# Patient Record
Sex: Female | Born: 1980 | Race: White | Hispanic: No | Smoking: Never smoker
Health system: Southern US, Community
[De-identification: ages and names within clinical notes are randomized; demographics above are authoritative.]

## PROBLEM LIST (undated history)

## (undated) DIAGNOSIS — L409 Psoriasis, unspecified: Secondary | ICD-10-CM

## (undated) DIAGNOSIS — R519 Headache, unspecified: Secondary | ICD-10-CM

## (undated) DIAGNOSIS — G43909 Migraine, unspecified, not intractable, without status migrainosus: Secondary | ICD-10-CM

## (undated) DIAGNOSIS — T7840XA Allergy, unspecified, initial encounter: Secondary | ICD-10-CM

## (undated) DIAGNOSIS — N921 Excessive and frequent menstruation with irregular cycle: Secondary | ICD-10-CM

## (undated) DIAGNOSIS — K829 Disease of gallbladder, unspecified: Secondary | ICD-10-CM

## (undated) DIAGNOSIS — E739 Lactose intolerance, unspecified: Secondary | ICD-10-CM

## (undated) DIAGNOSIS — B019 Varicella without complication: Secondary | ICD-10-CM

## (undated) DIAGNOSIS — M549 Dorsalgia, unspecified: Secondary | ICD-10-CM

## (undated) DIAGNOSIS — K760 Fatty (change of) liver, not elsewhere classified: Secondary | ICD-10-CM

## (undated) HISTORY — DX: Excessive and frequent menstruation with irregular cycle: N92.1

## (undated) HISTORY — DX: Migraine, unspecified, not intractable, without status migrainosus: G43.909

## (undated) HISTORY — DX: Allergy, unspecified, initial encounter: T78.40XA

## (undated) HISTORY — DX: Dorsalgia, unspecified: M54.9

## (undated) HISTORY — DX: Varicella without complication: B01.9

## (undated) HISTORY — DX: Lactose intolerance, unspecified: E73.9

## (undated) HISTORY — DX: Psoriasis, unspecified: L40.9

## (undated) HISTORY — PX: WISDOM TOOTH EXTRACTION: SHX21

## (undated) HISTORY — DX: Disease of gallbladder, unspecified: K82.9

## (undated) HISTORY — DX: Fatty (change of) liver, not elsewhere classified: K76.0

## (undated) HISTORY — DX: Headache, unspecified: R51.9

---
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
## 2002-01-31 ENCOUNTER — Other Ambulatory Visit: Admission: RE | Admit: 2002-01-31 | Discharge: 2002-01-31 | Payer: Self-pay | Admitting: Family Medicine

## 2002-05-07 ENCOUNTER — Other Ambulatory Visit: Admission: RE | Admit: 2002-05-07 | Discharge: 2002-05-07 | Payer: Self-pay | Admitting: Obstetrics and Gynecology

## 2002-07-15 ENCOUNTER — Encounter: Payer: Self-pay | Admitting: Emergency Medicine

## 2002-07-15 ENCOUNTER — Emergency Department (HOSPITAL_COMMUNITY): Admission: EM | Admit: 2002-07-15 | Discharge: 2002-07-16 | Payer: Self-pay | Admitting: Emergency Medicine

## 2003-06-24 ENCOUNTER — Other Ambulatory Visit: Admission: RE | Admit: 2003-06-24 | Discharge: 2003-06-24 | Payer: Self-pay | Admitting: Obstetrics and Gynecology

## 2004-03-24 ENCOUNTER — Other Ambulatory Visit: Admission: RE | Admit: 2004-03-24 | Discharge: 2004-03-24 | Payer: Self-pay | Admitting: Family Medicine

## 2013-01-30 ENCOUNTER — Emergency Department (HOSPITAL_COMMUNITY): Payer: No Typology Code available for payment source

## 2013-01-30 ENCOUNTER — Encounter (HOSPITAL_COMMUNITY): Payer: Self-pay | Admitting: Emergency Medicine

## 2013-01-30 ENCOUNTER — Emergency Department (HOSPITAL_COMMUNITY)
Admission: EM | Admit: 2013-01-30 | Discharge: 2013-01-30 | Disposition: A | Discharge status: Home / Self Care | Payer: No Typology Code available for payment source | Attending: Emergency Medicine | Admitting: Emergency Medicine

## 2013-01-30 DIAGNOSIS — S060X9A Concussion with loss of consciousness of unspecified duration, initial encounter: Secondary | ICD-10-CM | POA: Insufficient documentation

## 2013-01-30 DIAGNOSIS — Z79899 Other long term (current) drug therapy: Secondary | ICD-10-CM | POA: Insufficient documentation

## 2013-01-30 DIAGNOSIS — Z792 Long term (current) use of antibiotics: Secondary | ICD-10-CM | POA: Insufficient documentation

## 2013-01-30 DIAGNOSIS — R110 Nausea: Secondary | ICD-10-CM | POA: Insufficient documentation

## 2013-01-30 DIAGNOSIS — Y9389 Activity, other specified: Secondary | ICD-10-CM | POA: Insufficient documentation

## 2013-01-30 DIAGNOSIS — R42 Dizziness and giddiness: Secondary | ICD-10-CM | POA: Insufficient documentation

## 2013-01-30 DIAGNOSIS — R4789 Other speech disturbances: Secondary | ICD-10-CM | POA: Insufficient documentation

## 2013-01-30 DIAGNOSIS — Y92410 Unspecified street and highway as the place of occurrence of the external cause: Secondary | ICD-10-CM | POA: Insufficient documentation

## 2013-01-30 DIAGNOSIS — S060XAA Concussion with loss of consciousness status unknown, initial encounter: Secondary | ICD-10-CM | POA: Insufficient documentation

## 2013-01-30 MED ORDER — ACETAMINOPHEN 500 MG PO TABS
1000.0000 mg | ORAL_TABLET | Freq: Once | ORAL | Status: AC
  Administered 2013-01-30: 1000 mg via ORAL
  Filled 2013-01-30: qty 2
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
## 2013-01-30 NOTE — Discharge Instructions (Signed)
Follow up with your Primary care doctor in 2 days for reevaluation of symptoms. Get plenty of rest. Avoid participation in any sporting activities or high risk activities for ttrauma until symptoms completely resolve. If headache should worsen or you develop progressive confusion please return to ED. May take tylenol/motrin for Headache as needed. Refer to resource guide below for establishing primary care provider.    Emergency Department Resource Guide 1) Find a Doctor and Pay Out of Pocket Although you won't have to find out who is covered by your insurance plan, it is a good idea to ask around and get recommendations. You will then need to call the office and see if the doctor you have chosen will accept you as a new patient and what types of options they offer for patients who are self-pay. Some doctors offer discounts or will set up payment plans for their patients who do not have insurance, but you will need to ask so you aren't surprised when you get to your appointment.  2) Contact Your Local Health Department Not all health departments have doctors that can see patients for sick visits, but many do, so it is worth a call to see if yours does. If you don't know where your local health department is, you can check in your phone book. The CDC also has a tool to help you locate your state's health department, and many state websites also have listings of all of their local health departments.  3) Find a Walk-in Clinic If your illness is not likely to be very severe or complicated, you may want to try a walk in clinic. These are popping up all over the country in pharmacies, drugstores, and shopping centers. They're usually staffed by nurse practitioners or physician assistants that have been trained to treat common illnesses and complaints. They're usually fairly quick and inexpensive. However, if you have serious medical issues or chronic medical problems, these are probably not your best  option.  No Primary Care Doctor: - Call Health Connect at  [phone] - they can help you locate a primary care doctor that  accepts your insurance, provides certain services, etc. - Physician Referral Service- [phone]  Chronic Pain Problems: Organization         Address  Phone   Notes  [facility]  [phone] Patients need to be referred by their primary care doctor.   Medication Assistance: Organization         Address  Phone   Notes  [facility] [place] [address] [place] [place], [place] [address] [phone] --Must be a resident of [facility] -- Must have NO insurance coverage whatsoever (no Medicaid/ Medicare, etc.) -- The pt. MUST have a primary care doctor that directs their care regularly and follows them in the community   [facility]ist  [phone]   [employer]  [phone]    Agencies that provide inexpensive medical care: Organization         Address  Phone   Notes  [facility]  [phone]   [facility]    [phone]   [facility] [address] [place], [place] [address] [phone]   [facility] [address] [place]. [address] [place] [phone]   Planned Parenthood    [phone]   [facility]    [phone]   [facility]  [address], [place] Phone:  [phone]) [phone], Fax:  [phone]) [phone] Hours of Operation:  9 am - 6 pm, M-F.  Also accepts Medicaid/Medicare and self-pay.  [facility]  [address], [place] Phone: [phone](336) [phone], Fax: [phone]. Hours of Operation:  8:30 am - 5:30 pm, M-F.  Also accepts Medicaid and self-pay.  [facility] [place] [address], [place] Phone: [phone](336) [phone]   [facility] [address], [place], [place] [phone], Ext. [phone] Mondays & Thursdays: 7-9 AM.  First 15  patients are seen on a first come, first serve basis.    Medicaid-accepting [facility] Providers:  Organization         Address  Phone   Notes  [facility] [address], [place] [phone]) [phone] Also accepts self-pay patients.  [facility] [address], [place]  [phone]) [phone]   [facility] [address], [place] [phone]) [phone]   [facility] [address] [place] [address], [place] [phone]) [phone]   Renaye RakersVeita Bland [address], [place]   [phone] Only accepts [place] Access [place] patients after they have their name applied to their card.   Self-Pay (no insurance) in [place]:  Organization         Address  Phone   Notes  Sickle Cell Patients, [facility] [address], [place] [phone]) [phone]   [facility] [address], [place] [phone]) [phone]   [facility]  [address], [place] [phone]) [phone]   [facility]/Dr. Osei-Bonsu  [address] [place] [address], [place] or [phone], [place] [phone]) [phone] Phone number for both [place] and [place] locations is the same.  [facility] [address], [place] [phone]) [phone]   [facility] [place] [address] [place] [address], [place] or [address] [phone]) [phone]   [facility] [address], [place] [phone](336) [phone], phone; [phone](336) [phone], fax Sees patients 1st and 3rd Saturday of every month.  Must not qualify for public or private insurance (i.e. Medicaid, Medicare, [place] Health Choice, Veterans' Benefits)  Household income should be no more than 200% of the poverty level The clinic cannot treat you if you are pregnant or think you are pregnant  Sexually transmitted diseases are not treated at the clinic.    Dental  Care: Organization         Address  Phone  Notes  [facility] [address], [place] [phone] Accepts children up to age 31 who are enrolled in [place] or [place] Health Choice; pregnant women with a Medicaid card; and children who have applied for Medicaid or [place] Health Choice, but were declined, whose parents can pay a reduced fee at time of service.  [facility] Department of [facility] [place]  [address], [place] [phone] Accepts children up to age 31 who are enrolled in [place] or [place] Health Choice; pregnant women with a Medicaid card; and children who have applied for Medicaid or [place] Health Choice, but were declined, whose parents can pay a reduced fee at time of service.  [facility]  [address], [place] [phone]) [phone] Patients are seen  by appointment only. Walk-ins are not accepted. [facility] will see patients 31 years of age and older. Monday - Tuesday (8am-5pm) Most Wednesdays (8:30-5pm) $30 per visit, cash only  [facility] Adult [facility] Access PRO[facility]  [address], [facility] [phone] Patients are seen by appointment only. Walk-ins are not accepted. [facility] will see patients 28 years of age and older. One Wednesday Evening (Monthly: Volunteer Based).  $30 per visit, cash only  [employer]  [phone] for adults; Children under age 64, call Graduate Pediatric Dentistry at [phone]. Children aged 91-14, please call [phone] to request a pediatric application.  Dental services are provided in all areas of dental care including fillings, crowns and bridges, complete and partial dentures, implants, gum treatment, root canals, and extractions. Preventive care is also provided. Treatment is provided to both adults and children. Patients are selected via a lottery and there is often a waiting list.   [facility] [address], [place]  [phone] [email or web address]   [facility] [address] [place], [place] [phone], Ext. [phone] Second and Fourth Thursday of each month, opens at 6:30 AM; Clinic ends at 9 AM.  Patients are seen on a first-come first-served basis, and a limited number are seen during each clinic.   [facility]  [address] [place], [place] [phone]   Eligibility Requirements You must have lived in [place], [place], or [place] counties for at least the last three months.   You cannot be eligible for state or federal sponsored [place], including [employer], [place], or [employer].   You generally cannot be eligible for healthcare insurance through your employer.    How to apply: Eligibility screenings are held every Tuesday and Wednesday afternoon from 1:00 pm until 4:00 pm. You do not need an appointment for the interview!  [facility] [address], [place], [place] [phone]   [facility] Health Department  [phone]   [facility]  [phone]   [facility] Health Department  [phone]    Behavioral Health Resources in the Community: Intensive Outpatient Programs Organization         Address  Phone  Notes  [facility] [address] [place], [place] [phone]   [facility] Outpatient [address] [place], [place] [phone]   [facility] [address], [place], [place]  [phone]   [facility] [address]  [place], [place] [phone] or [phone]   Substance Abuse Resources Organization         Address  Phone  Notes  Al[facility] Drug Services  [phone]   [facility]  [phone]   [facility] [place]  [phone]   Floydene Flock  [phone]   Residential & Outpatient Substance Abuse Program  [phone]    Psychological Services Organization         Address  Phone  Notes  [facility]  [phone]   [facility]  [phone]   [facility] Health [address], [place] [phone] or [phone]    Mobile Crisis Teams Organization         Address  Phone  Notes  [facility], Mobile Crisis Care Unit  [phone]   [facility]  [address] [place], [place] [phone]   [facility] [address] [place]  [phone]    Self-Help/Support Groups Organization         Address  Phone             Notes  [facility] of [place] - variety of support groups  336- [phone] Call for more information  Narcotics Anonymous (NA), [facility] [address], [place]  2 meetings at this location   Statisticianesidential Treatment Programs Organization         Address  Phone  Notes  [facility] [address],    [place]  [phone]   [facility]  [address], [place] [phone], [place], [place] [phone]   [facility] [address], [place] [phone] Admissions: 8am-3pm M-F  [facility] [address]    [place], [place] [phone]   [facility] [address], [place], [place] [phone]   [facility] [address]  [place], [place] [phone]   [facility] - Intensive Outpatient [address], [place], [place] [phone]   [facility] ([facility] [address]  [place], [place] [phone] or [phone]   [facility] ([facility]) [address] [place], [place] [phone] Accepts Medicaid  [facility] [address]  [place] [phone] Substance Abuse/Addiction Treatment   [facility] Behavioral Health Resources Organization         Address  Phone  Notes  [facility]  [phone]) [phone]   Angie FavaJulie Brannon, PhD [address], [address] [place], [place]   [phone](336) [phone] or [phone](336) [phone]   [facility]   [address] [place], [place] [phone](336) [phone]   [facility] [address], [place], [place] [phone]) [phone] Insurance/Medicaid/sponsorship through [facility]  [facility] Families [address]                                    [place], [place] [phone](336) [phone] Therapy/tele-psych/case  [facility] [address].   [place], [place] [phone](336) [phone]    Dr. Lolly MustacheArfeen  [phone](336) [phone]   [facility] [place]  [employer] [facility]. 1) [address],  2) [address], [place] 3)  [address], [place] [phone](336) [phone](336) [phone]  [phone](336) [phone]   [facility] Child Abuse Hotline [phone]) [phone] or [phone](336) [phone] (After Hours)
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
## 2014-01-11 HISTORY — PX: LASIK: SHX215

## 2016-03-01 DIAGNOSIS — H6983 Other specified disorders of Eustachian tube, bilateral: Secondary | ICD-10-CM | POA: Insufficient documentation

## 2016-03-01 DIAGNOSIS — J3089 Other allergic rhinitis: Secondary | ICD-10-CM | POA: Insufficient documentation

## 2016-03-25 ENCOUNTER — Ambulatory Visit: Payer: Self-pay | Admitting: Women's Health

## 2017-08-29 ENCOUNTER — Ambulatory Visit: Payer: Self-pay | Admitting: Internal Medicine

## 2017-09-15 ENCOUNTER — Encounter: Payer: Self-pay | Admitting: Internal Medicine

## 2017-09-15 ENCOUNTER — Ambulatory Visit: Payer: BLUE CROSS/BLUE SHIELD | Admitting: Internal Medicine

## 2017-09-15 VITALS — BP 118/80 | HR 70 | Resp 12 | Ht 65.5 in | Wt 168.0 lb

## 2017-09-15 DIAGNOSIS — G43109 Migraine with aura, not intractable, without status migrainosus: Secondary | ICD-10-CM

## 2017-09-15 DIAGNOSIS — Z8342 Family history of familial hypercholesterolemia: Secondary | ICD-10-CM | POA: Diagnosis not present

## 2017-09-15 DIAGNOSIS — J302 Other seasonal allergic rhinitis: Secondary | ICD-10-CM | POA: Diagnosis not present

## 2017-09-15 DIAGNOSIS — E162 Hypoglycemia, unspecified: Secondary | ICD-10-CM | POA: Diagnosis not present

## 2017-09-15 DIAGNOSIS — R42 Dizziness and giddiness: Secondary | ICD-10-CM

## 2017-09-15 MED ORDER — DESLORATADINE 5 MG PO TABS: 5.0000 mg | ORAL_TABLET | Freq: Every day | ORAL | 11 refills | Status: DC

## 2017-09-15 MED ORDER — SUMATRIPTAN SUCCINATE 50 MG PO TABS: ORAL_TABLET | ORAL | 6 refills | Status: DC

## 2017-09-15 NOTE — Progress Notes (Signed)
Subjective:    Patient ID: Sherri Cobb, female    DOB: July 25, 1980, 37 y.o.   MRN: [id number]  HPI   Here to establish  1.  Seasonal Allergies:  Taking Bee pollen for this.  Spring and Fall.  Has taken Clarinex with improvement and Zyrtec with some improvement in past.  Claritin and Allegra do not help.   Itchy, runny nose and sneezing.  Sometimes itchy, watery eyes.  May have posterior pharyngeal drainage with a sore throat at times as well.     2.  Headaches:  Calls these migraines, but never diagnosed formally.  Started in 20s.  Seem to be getting more severe in intensity with time. Can have a sense of light headedness before the headache occurs.  No definite other aura like symptoms. Always on one side or the other.  Generally frontotemporal area.  Intense consistent pain and sometimes throbbing.  Describes photophobia, phonophobia.  Head massage helps with the pain.   No associated nausea or vomiting. Can last 2 hours.  Takes Motrin 2-4 tabs.  Seems to help, but not relieve completely.   Sleep seems to help, though again, not completely relieve. MJ definitely helps. No associated focal numbness, tingling or weakness. 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 No family history of migraines.    3.  Recurrent "uterine infection" :  OB/GYN very aware of this.  Started 4-5 years ago with IUD.   Had to have IUD removed. Usually occurs 2-3 times yearly.  Currently following with Eveline Keto.  Usually treated with 2 antibiotics.  4.  Vertigo:  Problems with this recently, and resolving.  Started after a visit to [place].    Current Meds  Medication Sig  . norethindrone-ethinyl estradiol (JUNEL FE,GILDESS FE,LOESTRIN FE) 1-20 MG-MCG tablet 1 tablet daily.    No Known Allergies   History reviewed. No pertinent past medical history.   History reviewed. No pertinent surgical history.   Family History  Problem  Relation Age of Onset  . Hyperlipidemia Mother   . Depression Father   . Gout Father   . Kidney Stones Father     Social History   Socioeconomic History  . Marital status: Media planner    Spouse name: Luisa Hart  . Number of children: 2  . Years of education: Not on file  . Highest education level: Associate degree: occupational, Scientist, product/process development, or vocational program  Occupational History  . Occupation: Print production planner office  Social Needs  . Financial resource strain: Not on file  . Food insecurity:    Worry: Never true    Inability: Never true  . Transportation needs:    Medical: No    Non-medical: No  Tobacco Use  . Smoking status: Never Smoker  . Smokeless tobacco: Never Used  Substance and Sexual Activity  . Alcohol use: Yes    Comment: Rare  . Drug use: Yes    Types: Marijuana  . Sexual activity: Yes    Birth control/protection: Pill  Lifestyle  . Physical activity:    Days per week: Not on file    Minutes per session: Not on file  . Stress: To some extent  Relationships  . Social connections:    Talks on phone: Not on file    Gets together: Not on file    Attends religious service: Not on file    Active member of club or organization: Not on file  Attends meetings of clubs or organizations: Not on file    Relationship status: Not on file  . Intimate partner violence:    Fear of current or ex partner: Not on file    Emotionally abused: No    Physically abused: No    Forced sexual activity: Not on file  Other Topics Concern  . Not on file  Social History Narrative   Lives in neighborhood with her 2 daughters   Long term female partner, Luisa Hart, is with them about 50% of time       Review of Systems     Objective:   Physical Exam  NAD\ HEENT:  PERRL, EOMI, Discs sharp. TMs dull with fluid behind, no erythema.  Nasal mucosa with mild swelling and clear discharge.  Posterior pharynx with mild erythema, unable to see cobbling. Neck:   Supple, No adenopathy, no thyromegaly Chest:  CTA CV:  RRR with normal S1 and S2, No S3, S4 or murmur.  Radial and DP pulses normal and equal Neuro:  A & O x 3, CN  II-XII grossly intact, DTRs 2+/4 throughout, Motor 5/5 throughout. Sensory to light touch grossly normal. Rapid alternating motions, finger to nose to finger normal.  No nystagmus with Lucious Groves maneuver.  Gait normal.       Assessment & Plan:  1.  Allergies:  Clarinex 5 mg daily.  Addition of nasal corticosteroids if not adequately relieved.  2.  Migraine headaches:  Sumatriptan 50 mg.  May repeat in 2 hours if not relieved.  Call if no improvement.  3.  Vertigo:  Possibly related to allergies.  No concerning findings on exam.  Reportedly improving.  4.  History of hypoglycemia:  CMP.  5.  Family history of hypercholesterolemia:  FLP  6.  Uterine infections per patient:  Will check her record. This is followed by OB/GYN  7.  HM:  Follow up for CPE in 4 months without gyn exam.

## 2017-09-15 NOTE — Progress Notes (Signed)
LCSW completed new patient screening with patient in order to assess for mental health concerns and/or problems with social determinants of health (food, housing, transportation, interpersonal violence). Patient reported that she has no major issues with SDOH except that she does not feel that her neighborhood is particularly safe. She shared that she feels she has a moderate stress level but nothing concerning. She reported that she feels fatigued easily and has been a "low energy person" all of her life. Scored a 3 on the PHQ-9.  She shared that she is considering having her adolescent daughter start counseling due to self-esteem issues; LCSW encouraged her to call and set up an appointment for daughter if needed.  No other follow-up needed at this time.

## 2017-09-16 LAB — COMPREHENSIVE METABOLIC PANEL
ALBUMIN: 4.6 g/dL (ref 3.5–5.5)
ALK PHOS: 42 IU/L (ref 39–117)
ALT: 16 IU/L (ref 0–32)
AST: 18 IU/L (ref 0–40)
Albumin/Globulin Ratio: 1.8 (ref 1.2–2.2)
BUN / CREAT RATIO: 12 (ref 9–23)
BUN: 11 mg/dL (ref 6–20)
Bilirubin Total: 0.6 mg/dL (ref 0.0–1.2)
CALCIUM: 9.9 mg/dL (ref 8.7–10.2)
CO2: 22 mmol/L (ref 20–29)
CREATININE: 0.93 mg/dL (ref 0.57–1.00)
Chloride: 103 mmol/L (ref 96–106)
GFR calc Af Amer: 93 mL/min/{1.73_m2} (ref >59)
GFR, EST NON AFRICAN AMERICAN: 80 mL/min/{1.73_m2} (ref >59)
GLUCOSE: 83 mg/dL (ref 65–99)
Globulin, Total: 2.6 g/dL (ref 1.5–4.5)
Potassium: 5 mmol/L (ref 3.5–5.2)
Sodium: 142 mmol/L (ref 134–144)
Total Protein: 7.2 g/dL (ref 6.0–8.5)

## 2017-09-16 LAB — LIPID PANEL W/O CHOL/HDL RATIO
CHOLESTEROL TOTAL: 224 mg/dL — AB (ref 100–199)
HDL: 66 mg/dL (ref >39)
LDL CALC: 140 mg/dL — AB (ref 0–99)
Triglycerides: 89 mg/dL (ref 0–149)
VLDL CHOLESTEROL CAL: 18 mg/dL (ref 5–40)

## 2017-12-10 ENCOUNTER — Encounter: Payer: Self-pay | Admitting: Internal Medicine

## 2017-12-10 DIAGNOSIS — Z8742 Personal history of other diseases of the female genital tract: Secondary | ICD-10-CM | POA: Insufficient documentation

## 2017-12-10 HISTORY — DX: Personal history of other diseases of the female genital tract: Z87.42

## 2018-01-16 ENCOUNTER — Encounter: Payer: Self-pay | Admitting: Internal Medicine

## 2018-01-16 ENCOUNTER — Ambulatory Visit: Payer: BLUE CROSS/BLUE SHIELD | Admitting: Internal Medicine

## 2018-01-16 VITALS — BP 118/80 | HR 84 | Resp 12 | Ht 65.5 in | Wt 174.0 lb

## 2018-01-16 DIAGNOSIS — Z0000 Encounter for general adult medical examination without abnormal findings: Secondary | ICD-10-CM | POA: Diagnosis not present

## 2018-01-16 DIAGNOSIS — H547 Unspecified visual loss: Secondary | ICD-10-CM

## 2018-01-16 DIAGNOSIS — G43109 Migraine with aura, not intractable, without status migrainosus: Secondary | ICD-10-CM | POA: Diagnosis not present

## 2018-01-16 DIAGNOSIS — N921 Excessive and frequent menstruation with irregular cycle: Secondary | ICD-10-CM | POA: Insufficient documentation

## 2018-01-16 MED ORDER — SUMATRIPTAN SUCCINATE 50 MG PO TABS: ORAL_TABLET | ORAL | 6 refills | Status: DC

## 2018-01-16 NOTE — Patient Instructions (Signed)
Consider Micronor (no estrogen) regarding BCPs with perhaps decreased headaches associated.  Citrated Calcium 500-600 mg twice daily and Vitamin D a total of 400 IU Daily

## 2018-01-16 NOTE — Progress Notes (Signed)
Subjective:    Patient ID: Sherri Cobb, female    DOB: Sep 21, 1980, 38 y.o.   MRN: [id number]  HPI   CPE with pap  1.  Pap:  Last was in early spring of 2019.  Goes to [employer].  Was having uterine infections.  Plans for follow up there in March 2020.  No family history of cervical cancer.   2.  Mammogram: Never.  No family history of breast cancer.    3.  Osteoprevention:  Very little dairy.  Outdoors on almost daily basis.  Physically active outside.  4-5 hours of strength training every week.    4.  Guaiac Cards:  Never  5.  Colonoscopy:  Never.  No family history of colon cancer.  6.  Immunizations:  Needs Td or Tdap.  Immunization History  Administered Date(s) Administered  . Influenza-Unspecified 12/10/2017    7.  Glucose/Cholesterol:  Glucose in September fine.  Mild hypercholesterolemia with good HDL.. Lipid Panel     Component Value Date/Time   CHOL 224 (H) 09/15/2017 1058   TRIG 89 09/15/2017 1058   HDL 66 09/15/2017 1058   LDLCALC 140 (H) 09/15/2017 1058   Also:  Migraines seem more frequent than before she took OCPs.  Problems with IUDs in past with yeast infection.  No Depo provera in past. Discussed considering Micronor with Gyn in spring. Imitrex 50 does not generally relieve migraines.  Has not tried 100 mg dose all at one time.   MJ works better for headache, but smoking it.  Current Meds  Medication Sig  . norethindrone-ethinyl estradiol (JUNEL FE,GILDESS FE,LOESTRIN FE) 1-20 MG-MCG tablet 1 tablet daily.   . SUMAtriptan (IMITREX) 50 MG tablet 1 tab by mouth for headache May repeat in 2 hours if headache persists or recurs.    No Known Allergies   Past Medical History:  Diagnosis Date  . Menometrorrhagia    Before BCPs   Past Surgical History:  Procedure Laterality Date  . LASIK Bilateral 2016  . WISDOM TOOTH EXTRACTION      Family History  Problem Relation Age of Onset  . Hyperlipidemia Mother   . Depression Father   .  Gout Father   . Kidney Stones Father   . Cancer Maternal Grandmother        skin cancer   Social History   Socioeconomic History  . Marital status: Media planner    Spouse name: Luisa Hart  . Number of children: 2  . Years of education: Not on file  . Highest education level: Associate degree: occupational, Scientist, product/process development, or vocational program  Occupational History  . Occupation: Print production planner office  Social Needs  . Financial resource strain: Not on file  . Food insecurity:    Worry: Never true    Inability: Never true  . Transportation needs:    Medical: No    Non-medical: No  Tobacco Use  . Smoking status: Never Smoker  . Smokeless tobacco: Never Used  Substance and Sexual Activity  . Alcohol use: Yes    Comment: Rare  . Drug use: Yes    Types: Marijuana  . Sexual activity: Yes    Birth control/protection: Pill  Lifestyle  . Physical activity:    Days per week: Not on file    Minutes per session: Not on file  . Stress: To some extent  Relationships  . Social connections:    Talks on phone: Not on file    Gets together: Not on file  Attends religious service: Not on file    Active member of club or organization: Not on file    Attends meetings of clubs or organizations: Not on file    Relationship status: Not on file  . Intimate partner violence:    Fear of current or ex partner: Not on file    Emotionally abused: No    Physically abused: No    Forced sexual activity: Not on file  Other Topics Concern  . Not on file  Social History Narrative   Lives in neighborhood with her 2 daughters   Long term female partner, Luisa Hartatrick, is with them about 50% of time     Review of Systems  Constitutional: Negative for appetite change and fatigue.  HENT: Positive for rhinorrhea (Always has a bit from allergies). Negative for dental problem, ear pain, hearing loss and sore throat.   Eyes: Negative for visual disturbance (History of Lasik in 2016.).    Respiratory: Negative for cough and shortness of breath.   Cardiovascular: Negative for chest pain, palpitations and leg swelling.  Gastrointestinal: Negative for abdominal pain, blood in stool (No melena), constipation and diarrhea.  Genitourinary: Negative for dysuria, frequency, menstrual problem and vaginal discharge.       History of recurrent yeast infections with IUD in past  Musculoskeletal: Negative for arthralgias.  Skin: Negative for rash.  Neurological: Negative for dizziness, weakness and numbness.  Psychiatric/Behavioral: Negative for dysphoric mood. The patient is not nervous/anxious.        Objective:   Physical Exam Constitutional:      Appearance: Normal appearance.  HENT:     Head: Normocephalic and atraumatic.     Jaw: There is normal jaw occlusion.     Right Ear: Hearing, tympanic membrane, ear canal and external ear normal.     Left Ear: Hearing, tympanic membrane, ear canal and external ear normal.     Nose: Nose normal.     Mouth/Throat:     Lips: Pink.     Mouth: Mucous membranes are moist.     Pharynx: Oropharynx is clear. Uvula midline.  Eyes:     Extraocular Movements: Extraocular movements intact.     Conjunctiva/sclera: Conjunctivae normal.     Pupils: Pupils are equal, round, and reactive to light.     Funduscopic exam:    Right eye: Red reflex present.        Left eye: Red reflex present.    Comments: Discs sharp bilaterally  Neck:     Musculoskeletal: Full passive range of motion without pain, normal range of motion and neck supple.     Thyroid: No thyromegaly.  Cardiovascular:     Rate and Rhythm: Normal rate and regular rhythm.     Heart sounds: S1 normal and S2 normal. No murmur. No friction rub. No S3 or S4 sounds.      Comments: Carotid, radial, femoral, DP, and PT pulses normal and equal. Pulmonary:     Effort: Pulmonary effort is normal.     Breath sounds: Normal breath sounds.  Chest:     Comments: Breast exam deferred as she  will be seen by Gyn in the spring. Abdominal:     General: Bowel sounds are normal.     Palpations: Abdomen is soft. There is no hepatomegaly, splenomegaly or mass.     Tenderness: There is no abdominal tenderness.     Hernia: No hernia is present.  Genitourinary:    Comments: Exam deferred to Gyn in spring. Musculoskeletal:  Normal range of motion.  Lymphadenopathy:     Head:     Right side of head: No submental or submandibular adenopathy.     Left side of head: No submental or submandibular adenopathy.     Cervical: No cervical adenopathy.     Upper Body:     Right upper body: No supraclavicular adenopathy.     Left upper body: No supraclavicular adenopathy.     Lower Body: No right inguinal adenopathy. No left inguinal adenopathy.  Skin:    General: Skin is warm.     Capillary Refill: Capillary refill takes less than 2 seconds.     Comments: Multiple tattoos on back and elswhere. Mild scattering of acneiform lesions of face.  Neurological:     Mental Status: She is alert and oriented to person, place, and time.     Cranial Nerves: Cranial nerves are intact.     Sensory: Sensation is intact.     Motor: Motor function is intact.     Coordination: Coordination is intact.     Gait: Gait is intact.     Deep Tendon Reflexes: Reflexes are normal and symmetric.  Psychiatric:        Attention and Perception: Attention and perception normal.        Mood and Affect: Mood normal.        Speech: Speech normal.        Behavior: Behavior normal. Behavior is cooperative.        Thought Content: Thought content normal.        Cognition and Memory: Cognition normal.        Judgment: Judgment normal.        Assessment & Plan:  1.  CPE without pap To have gyn exam in spring with her Gyn [facility]. Citracal 500-600 mg twice daily with Vitamin D of 400 IU daily. Insurance related paperwork filled out from physical.  2.  Migraines:  To  increase to 100 of Imitrex and see if improved relief.  3.  Decreased visual acuity, S/P Lasik in 2016.  Mainly at night.  Referral to Dr. Dione BoozeGroat.

## 2018-03-14 ENCOUNTER — Telehealth: Payer: Self-pay

## 2018-03-14 NOTE — Telephone Encounter (Signed)
Left message for patient informing her of appointment with Dr. Dione Booze for 03/20/2018 @ 2 pm.

## 2018-05-15 ENCOUNTER — Telehealth: Payer: Self-pay | Admitting: Physician Assistant

## 2018-05-15 ENCOUNTER — Telehealth: Payer: Self-pay

## 2018-05-15 DIAGNOSIS — R519 Headache, unspecified: Secondary | ICD-10-CM

## 2018-05-15 DIAGNOSIS — R197 Diarrhea, unspecified: Secondary | ICD-10-CM

## 2018-05-15 DIAGNOSIS — R05 Cough: Secondary | ICD-10-CM

## 2018-05-15 DIAGNOSIS — R51 Headache: Secondary | ICD-10-CM

## 2018-05-15 DIAGNOSIS — R509 Fever, unspecified: Secondary | ICD-10-CM

## 2018-05-15 DIAGNOSIS — R059 Cough, unspecified: Secondary | ICD-10-CM

## 2018-05-15 NOTE — Progress Notes (Signed)
E-Visit for Corona Virus Screening  Based on your current symptoms, you may very well have the virus, however your symptoms are mild. Currently, not all patients are being tested. If the symptoms are mild and there is not a known exposure, performing the test is not indicated.  You have been enrolled in MyChart Home Monitoring for COVID-19. Daily you will receive a questionnaire within the MyChart website. Our COVID-19 response team will be monitoring your responses daily.  I have provided a work note  Coronavirus disease 2019 (COVID-19) is a respiratory illness that can spread from person to person. The virus that causes COVID-19 is a new virus that was first identified in the country of [place] but is now found in multiple other countries and has spread to the [place].  Symptoms associated with the virus are mild to severe fever, cough, and shortness of breath. There is currently no vaccine to protect against COVID-19, and there is no specific antiviral treatment for the virus.   To be considered HIGH RISK for Coronavirus (COVID-19), you have to meet the following criteria:  . Traveled to [place], [place], [place], [place] or [place]; or in the [place] to [place], [place], [place], or [place]; and have fever, cough, and shortness of breath within the last 2 weeks of travel OR  . Been in close contact with a person diagnosed with COVID-19 within the last 2 weeks and have fever, cough, and shortness of breath  . IF YOU DO NOT MEET THESE CRITERIA, YOU ARE CONSIDERED LOW RISK FOR COVID-19.   It is vitally important that if you feel that you have an infection such as this virus or any other virus that you stay home and away from places where you may spread it to others.  You should self-quarantine for 14 days if you have symptoms that could potentially be coronavirus and avoid contact with people age 25 and older.   You can use medication such as A prescription cough medication called  Tessalon Perles 100 mg. You may take 1-2 capsules every 8 hours as needed for cough  You may also take acetaminophen (Tylenol) as needed for fever.   Reduce your risk of any infection by using the same precautions used for avoiding the common cold or flu:  Marland Kitchen Wash your hands often with soap and warm water for at least 20 seconds.  If soap and water are not readily available, use an alcohol-based hand sanitizer with at least 60% alcohol.  . If coughing or sneezing, cover your mouth and nose by coughing or sneezing into the elbow areas of your shirt or coat, into a tissue or into your sleeve (not your hands). . Avoid shaking hands with others and consider head nods or verbal greetings only. . Avoid touching your eyes, nose, or mouth with unwashed hands.  . Avoid close contact with people who are sick. . Avoid places or events with large numbers of people in one location, like concerts or sporting events. . Carefully consider travel plans you have or are making. . If you are planning any travel outside or inside the [place], visit the CDC's Travelers' Health webpage for the latest health notices. . If you have some symptoms but not all symptoms, continue to monitor at home and seek medical attention if your symptoms worsen. . If you are having a medical emergency, call 911.  HOME CARE . Only take medications as instructed by your medical team. . Drink plenty of fluids  and get plenty of rest. . A steam or ultrasonic humidifier can help if you have congestion.   GET HELP RIGHT AWAY IF: . You develop worsening fever. . You become short of breath . You cough up blood. . Your symptoms become more severe MAKE SURE YOU   Understand these instructions.  Will watch your condition.  Will get help right away if you are not doing well or get worse.  Your e-visit answers were reviewed by a board certified advanced clinical practitioner to complete your personal care plan.  Depending on the condition,  your plan could have included both over the counter or prescription medications.  If there is a problem please reply once you have received a response from your provider. Your safety is important to us.  If you have drug allergies check your prescription carefully.    You can use MyChart to ask questions about today's visit, request a non-urgent call back, or ask for a work or school excuse for 24 hours related to this e-Visit. If it has been greater than 24 hours you will need to follow up with your provider, or enter a new e-Visit to address those concerns. You will get an e-mail in the next two days asking about your experience.  I hope that your e-visit has been valuable and will speed your recovery. Thank you for using e-visits.   I have spent 7 min in completion and review of this note- Illa LevelSahar Osman [facility]

## 2018-05-15 NOTE — Telephone Encounter (Signed)
Patient called stating she has been exposed to someone who had COVID-19. Patient states her boyfriends mother and father both had it. States it had been 14 days since they were tested. Patient hugged boyfriends mother last week.  Patient presenting with muscle pain, diarrhea, sore throat and headache. Patient denies any fever or cough but has a little bit of a runny nose. All symptoms started yesterday 05/14/2018. Spoke with Dr. Amil Amen regarding patient ans symptoms. Per Dr. Amil Amen patient to have entire family self-quarantine including boyfriend for the next 14 days. If symptoms get worse she needs to call back to the office for further evaluation.  Patient informed and wants to know why she can not test in formed she has not met the criteria to be tested at this moment. Patient states she has insurance and she would like to just know because she may come in contact with other people. Patient informed again that she needs to self quarantine herself and rest of family members for the next 14 days. Patient verbalized understanding. Patient was told several times to self quarantine.

## 2018-07-31 ENCOUNTER — Other Ambulatory Visit: Payer: Self-pay | Admitting: Internal Medicine

## 2018-07-31 DIAGNOSIS — Z20822 Contact with and (suspected) exposure to covid-19: Secondary | ICD-10-CM

## 2018-08-04 LAB — NOVEL CORONAVIRUS, NAA: SARS-CoV-2, NAA: NOT DETECTED

## 2018-09-13 ENCOUNTER — Other Ambulatory Visit: Payer: Self-pay | Admitting: Emergency Medicine

## 2018-09-13 DIAGNOSIS — Z20822 Contact with and (suspected) exposure to covid-19: Secondary | ICD-10-CM

## 2018-09-14 LAB — NOVEL CORONAVIRUS, NAA: SARS-CoV-2, NAA: NOT DETECTED

## 2018-12-18 ENCOUNTER — Other Ambulatory Visit: Payer: Self-pay

## 2018-12-18 DIAGNOSIS — Z20822 Contact with and (suspected) exposure to covid-19: Secondary | ICD-10-CM

## 2018-12-20 LAB — NOVEL CORONAVIRUS, NAA: SARS-CoV-2, NAA: DETECTED — AB

## 2018-12-22 ENCOUNTER — Encounter: Payer: Self-pay | Admitting: Internal Medicine

## 2018-12-22 NOTE — Telephone Encounter (Signed)
Spoke with patient. Informed we can not write a letter until she has completed her isolation period and is on the mend. Patient verbalized understanding and states she will call back then

## 2018-12-27 ENCOUNTER — Encounter: Payer: Self-pay | Admitting: Internal Medicine

## 2018-12-28 NOTE — Telephone Encounter (Signed)
Called patient to get more information and states she started with symptoms on 12/15/2018 and was tested on 12/18/2018.  Pt was advise by the nurse that called her with positive results that she will be able to return to work 14 days after onset of symptoms.  Pt . States she is still having some tiredness and a little bit of cough and that symptoms have been improving.  Pt. Denies any fever and states last time she took tylenol or ibuprofen was two days ago for a headache .  Routing to Dr. Amil Amen to write letter

## 2018-12-29 NOTE — Telephone Encounter (Signed)
Patient called to get update on the status of letter  to be able to return to work. Patient informed letter is no ready yet and will be contacted when  is ready for pick up. Verbalized understanding.

## 2019-01-01 ENCOUNTER — Telehealth: Payer: Self-pay | Admitting: Internal Medicine

## 2019-01-01 NOTE — Telephone Encounter (Signed)
Note needed for return to work--done and patient being called now to pick up

## 2019-11-20 ENCOUNTER — Encounter: Payer: Self-pay | Admitting: Internal Medicine

## 2019-12-20 ENCOUNTER — Encounter (HOSPITAL_BASED_OUTPATIENT_CLINIC_OR_DEPARTMENT_OTHER): Payer: Self-pay | Admitting: Orthopaedic Surgery

## 2019-12-20 ENCOUNTER — Other Ambulatory Visit: Payer: Self-pay

## 2019-12-24 ENCOUNTER — Other Ambulatory Visit (HOSPITAL_COMMUNITY)
Admission: RE | Admit: 2019-12-24 | Discharge: 2019-12-24 | Disposition: A | Discharge status: Home / Self Care | Payer: 59 | Source: Ambulatory Visit | Attending: Orthopaedic Surgery | Admitting: Orthopaedic Surgery

## 2019-12-24 DIAGNOSIS — Z20822 Contact with and (suspected) exposure to covid-19: Secondary | ICD-10-CM | POA: Insufficient documentation

## 2019-12-24 DIAGNOSIS — Z01812 Encounter for preprocedural laboratory examination: Secondary | ICD-10-CM | POA: Insufficient documentation

## 2019-12-24 LAB — SARS CORONAVIRUS 2 (TAT 6-24 HRS): SARS Coronavirus 2: NEGATIVE

## 2019-12-25 NOTE — H&P (Signed)
PREOPERATIVE H&P  Chief Complaint: UNILATERAL PRIMARY OSTEOARTHRITIS RIGHT KNEE; LOOSE BODY IN RIGHT KNEE, CHONDROMALACIA PATELLAE RIGHT KNEE, CHRONIC INSTABILITY RIGHT KNEE  HPI: Sherri Cobb is a 39 y.o. female who is scheduled for, Procedure(s): KNEE ARTHROSCOPY WITH LATERAL RELEASE; REMOVAL LOOSE FOREIGN BODY; DEBRIDEMENT/SHAVING CHONDROPLASTY LIGAMENT RECONSTRUCTION KNEE EXTRA-ARTICULAR; ANTERIOR TIBIAL TUBERCLEPLASTY; DECOMPRESSION FASCIOTOMY, LEG ANTERIOR.    This is a healthy 39 year old female who has had a history of multiple patellar dislocations since 1996.  She has tried immobilizers and therapy over time and has not made much progress.  She is an active young mother and works as an Environmental health practitioner.  She had a complete dislocation four weeks ago and has had multiple in the past.  She is worried about the long-term health of her knee and the ability to keep up with her kids.    Her symptoms are rated as moderate to severe, and have been worsening.  This is significantly impairing activities of daily living.    Please see clinic note for further details on this patient's care.    She has elected for surgical management.   Past Medical History:  Diagnosis Date  . Menometrorrhagia    Before BCPs   Past Surgical History:  Procedure Laterality Date  . LASIK Bilateral 2016  . WISDOM TOOTH EXTRACTION     Social History   Socioeconomic History  . Marital status: Media planner    Spouse name: Luisa Hart  . Number of children: 2  . Years of education: Not on file  . Highest education level: Associate degree: occupational, Scientist, product/process development, or vocational program  Occupational History  . 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 Smoking status: Never Smoker  . Smokeless tobacco: Never Used  Vaping Use  . Vaping Use: Never used  Substance and Sexual Activity  . Alcohol use: Yes    Comment: Rare  . Drug use: Yes    Types: Marijuana  . Sexual  activity: Yes  Other Topics Concern  . Not on file  Social History Narrative   Lives in neighborhood with her 2 daughters   Long term female partner, Luisa Hart, is with them about 50% of time   Social Determinants of Corporate investment banker Strain: Not on file  Food Insecurity: Not on file  Transportation Needs: Not on file  Physical Activity: Not on file  Stress: Not on file  Social Connections: Not on file   Family History  Problem Relation Age of Onset  . Hyperlipidemia Mother   . Depression Father   . Gout Father   . Kidney Stones Father   . Cancer Maternal Grandmother        skin cancer   No Known Allergies Prior to Admission medications   Medication Sig Start Date End Date Taking? Authorizing Provider  Multiple Vitamin (MULTIVITAMIN WITH MINERALS) TABS tablet Take 1 tablet by mouth daily.   Yes [provider]  SUMAtriptan (IMITREX) 50 MG tablet 2 tabs by mouth for headache May repeat in 2 hours if headache persists or recurs.  Max 200 mg/24 hours 01/16/18   Julieanne Manson, MD    ROS: All other systems have been reviewed and were otherwise negative with the exception of those mentioned in the HPI and as above.  Physical Exam: General: Alert, no acute distress Cardiovascular: No pedal edema Respiratory: No cyanosis, no use of accessory musculature GI: No organomegaly, abdomen is soft and non-tender Skin: No lesions in the area of chief complaint  Neurologic: Sensation intact distally Psychiatric: Patient is competent for consent with normal mood and affect Lymphatic: No axillary or cervical lymphadenopathy  MUSCULOSKELETAL:  Examination of bilateral knees reveals mild lateral translation and patellar apprehension with this.  She has a positive J sign.  Q angle is about 25.  Full range of motion of the knee otherwise.  Some crepitus during range of motion of bilateral knees  Imaging: X-rays which demonstrate possible loose body in the knee versus soft  tissue.  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  Patella appears well centered on sunrise view.  No other obvious arthritic changes of the knee.    Assessment: UNILATERAL PRIMARY OSTEOARTHRITIS RIGHT KNEE; LOOSE BODY IN RIGHT KNEE, CHONDROMALACIA PATELLAE RIGHT KNEE, CHRONIC INSTABILITY RIGHT KNEE  Plan: Plan for Procedure(s): KNEE ARTHROSCOPY WITH LATERAL RELEASE; REMOVAL LOOSE FOREIGN BODY; DEBRIDEMENT/SHAVING CHONDROPLASTY LIGAMENT RECONSTRUCTION KNEE EXTRA-ARTICULAR; ANTERIOR TIBIAL TUBERCLEPLASTY; DECOMPRESSION FASCIOTOMY, LEG ANTERIOR   The risks benefits and alternatives were discussed with the patient including but not limited to the risks of nonoperative treatment, versus surgical intervention including infection, bleeding, nerve injury,  blood clots, cardiopulmonary complications, morbidity, mortality, among others, and they were willing to proceed.   The patient acknowledged the explanation, agreed to proceed with the plan and consent was signed.   Operative Plan: Right knee arthroscopy with lateral release, chondroplasty, loose body excision, MPFL reconstruction with allograft, tibial tubercle transfer Discharge Medications: Tylenol, Meloxicam BID, Oxycodone, Zofran, Robaxin DVT Prophylaxis: Aspirin Physical Therapy: Outpatient PT Special Discharge needs: Knee immobilizer   Vernetta Honey, PA-C  12/25/2019 8:20 PM

## 2019-12-26 NOTE — Progress Notes (Signed)

## 2019-12-27 ENCOUNTER — Ambulatory Visit (HOSPITAL_COMMUNITY): Payer: 59

## 2019-12-27 ENCOUNTER — Ambulatory Visit (HOSPITAL_BASED_OUTPATIENT_CLINIC_OR_DEPARTMENT_OTHER): Payer: 59 | Admitting: Certified Registered"

## 2019-12-27 ENCOUNTER — Encounter (HOSPITAL_BASED_OUTPATIENT_CLINIC_OR_DEPARTMENT_OTHER)
Admission: RE | Disposition: A | Discharge status: Home / Self Care | Payer: Self-pay | Source: Home / Self Care | Attending: Orthopaedic Surgery

## 2019-12-27 ENCOUNTER — Other Ambulatory Visit: Payer: Self-pay

## 2019-12-27 ENCOUNTER — Ambulatory Visit (HOSPITAL_BASED_OUTPATIENT_CLINIC_OR_DEPARTMENT_OTHER)
Admission: RE | Admit: 2019-12-27 | Discharge: 2019-12-27 | Disposition: A | Discharge status: Home / Self Care | Payer: 59 | Attending: Orthopaedic Surgery | Admitting: Orthopaedic Surgery

## 2019-12-27 ENCOUNTER — Encounter (HOSPITAL_BASED_OUTPATIENT_CLINIC_OR_DEPARTMENT_OTHER): Payer: Self-pay | Admitting: Orthopaedic Surgery

## 2019-12-27 DIAGNOSIS — M1711 Unilateral primary osteoarthritis, right knee: Secondary | ICD-10-CM | POA: Diagnosis not present

## 2019-12-27 DIAGNOSIS — M2201 Recurrent dislocation of patella, right knee: Secondary | ICD-10-CM | POA: Insufficient documentation

## 2019-12-27 DIAGNOSIS — M2241 Chondromalacia patellae, right knee: Secondary | ICD-10-CM | POA: Diagnosis not present

## 2019-12-27 DIAGNOSIS — Z419 Encounter for procedure for purposes other than remedying health state, unspecified: Secondary | ICD-10-CM

## 2019-12-27 DIAGNOSIS — M2341 Loose body in knee, right knee: Secondary | ICD-10-CM | POA: Diagnosis not present

## 2019-12-27 HISTORY — PX: KNEE ARTHROSCOPY WITH LATERAL RELEASE: SHX5649

## 2019-12-27 HISTORY — PX: TIBIAL TUBERCLERPLASTY: SHX6531

## 2019-12-27 LAB — POCT PREGNANCY, URINE: Preg Test, Ur: NEGATIVE

## 2019-12-27 SURGERY — ARTHROSCOPY, KNEE, WITH LATERAL RETINACULUM RELEASE
Anesthesia: General | Site: Knee | Laterality: Right

## 2019-12-27 MED ORDER — HYDROMORPHONE HCL 1 MG/ML IJ SOLN
INTRAMUSCULAR | Status: AC
  Filled 2019-12-27: qty 0.5

## 2019-12-27 MED ORDER — EPINEPHRINE PF 1 MG/ML IJ SOLN
INTRAMUSCULAR | Status: AC
  Filled 2019-12-27: qty 2

## 2019-12-27 MED ORDER — BUPIVACAINE HCL 0.25 % IJ SOLN
INTRAMUSCULAR | Status: DC | PRN
  Administered 2019-12-27: 30 mL

## 2019-12-27 MED ORDER — CELECOXIB 200 MG PO CAPS
200.0000 mg | ORAL_CAPSULE | Freq: Once | ORAL | Status: AC
  Administered 2019-12-27: 11:00:00 200 mg via ORAL

## 2019-12-27 MED ORDER — HYDROMORPHONE HCL 1 MG/ML IJ SOLN
0.2500 mg | INTRAMUSCULAR | Status: DC | PRN
  Administered 2019-12-27: 0.5 mg via INTRAVENOUS

## 2019-12-27 MED ORDER — CEFAZOLIN SODIUM-DEXTROSE 2-4 GM/100ML-% IV SOLN
2.0000 g | INTRAVENOUS | Status: AC
  Administered 2019-12-27: 13:00:00 2 g via INTRAVENOUS

## 2019-12-27 MED ORDER — ONDANSETRON HCL 4 MG PO TABS: 4.0000 mg | ORAL_TABLET | Freq: Three times a day (TID) | ORAL | 1 refills | Status: AC | PRN

## 2019-12-27 MED ORDER — OXYCODONE HCL 5 MG PO TABS: ORAL_TABLET | ORAL | 0 refills | Status: AC

## 2019-12-27 MED ORDER — ACETAMINOPHEN 500 MG PO TABS
ORAL_TABLET | ORAL | Status: AC
  Filled 2019-12-27: qty 2

## 2019-12-27 MED ORDER — OXYCODONE HCL 5 MG PO TABS
5.0000 mg | ORAL_TABLET | Freq: Once | ORAL | Status: AC
  Administered 2019-12-27: 5 mg via ORAL

## 2019-12-27 MED ORDER — PROPOFOL 10 MG/ML IV BOLUS
INTRAVENOUS | Status: AC
  Filled 2019-12-27: qty 20

## 2019-12-27 MED ORDER — MIDAZOLAM HCL 2 MG/2ML IJ SOLN
INTRAMUSCULAR | Status: AC
  Filled 2019-12-27: qty 2

## 2019-12-27 MED ORDER — ACETAMINOPHEN 500 MG PO TABS
1000.0000 mg | ORAL_TABLET | Freq: Once | ORAL | Status: AC
  Administered 2019-12-27: 11:00:00 1000 mg via ORAL

## 2019-12-27 MED ORDER — CEFAZOLIN SODIUM-DEXTROSE 2-4 GM/100ML-% IV SOLN
INTRAVENOUS | Status: AC
  Filled 2019-12-27: qty 100

## 2019-12-27 MED ORDER — FENTANYL CITRATE (PF) 100 MCG/2ML IJ SOLN
INTRAMUSCULAR | Status: AC
  Filled 2019-12-27: qty 2

## 2019-12-27 MED ORDER — DEXAMETHASONE SODIUM PHOSPHATE 10 MG/ML IJ SOLN
INTRAMUSCULAR | Status: DC | PRN
  Administered 2019-12-27: 10 mg via INTRAVENOUS

## 2019-12-27 MED ORDER — ASPIRIN 81 MG PO CHEW: 81.0000 mg | CHEWABLE_TABLET | Freq: Two times a day (BID) | ORAL | 0 refills | Status: AC

## 2019-12-27 MED ORDER — OXYCODONE HCL 5 MG PO TABS
ORAL_TABLET | ORAL | Status: AC
  Filled 2019-12-27: qty 1

## 2019-12-27 MED ORDER — ONDANSETRON HCL 4 MG/2ML IJ SOLN
INTRAMUSCULAR | Status: AC
  Filled 2019-12-27: qty 2

## 2019-12-27 MED ORDER — CELECOXIB 200 MG PO CAPS
ORAL_CAPSULE | ORAL | Status: AC
  Filled 2019-12-27: qty 1

## 2019-12-27 MED ORDER — VANCOMYCIN HCL 1 G IV SOLR
INTRAVENOUS | Status: DC | PRN
  Administered 2019-12-27: 1000 mg via TOPICAL

## 2019-12-27 MED ORDER — OXYCODONE HCL 5 MG PO TABS: 10.0000 mg | ORAL_TABLET | Freq: Once | ORAL | Status: DC

## 2019-12-27 MED ORDER — MIDAZOLAM HCL 5 MG/5ML IJ SOLN
INTRAMUSCULAR | Status: DC | PRN
  Administered 2019-12-27: 2 mg via INTRAVENOUS

## 2019-12-27 MED ORDER — LACTATED RINGERS IV SOLN: INTRAVENOUS | Status: DC

## 2019-12-27 MED ORDER — LIDOCAINE 2% (20 MG/ML) 5 ML SYRINGE
INTRAMUSCULAR | Status: AC
  Filled 2019-12-27: qty 5

## 2019-12-27 MED ORDER — VANCOMYCIN HCL 1000 MG IV SOLR
INTRAVENOUS | Status: AC
  Filled 2019-12-27: qty 1000

## 2019-12-27 MED ORDER — ONDANSETRON HCL 4 MG/2ML IJ SOLN
INTRAMUSCULAR | Status: DC | PRN
  Administered 2019-12-27: 4 mg via INTRAVENOUS

## 2019-12-27 MED ORDER — LIDOCAINE HCL (CARDIAC) PF 100 MG/5ML IV SOSY
PREFILLED_SYRINGE | INTRAVENOUS | Status: DC | PRN
  Administered 2019-12-27: 60 mg via INTRAVENOUS

## 2019-12-27 MED ORDER — FENTANYL CITRATE (PF) 100 MCG/2ML IJ SOLN
INTRAMUSCULAR | Status: DC | PRN
  Administered 2019-12-27: 50 ug via INTRAVENOUS
  Administered 2019-12-27 (×4): 25 ug via INTRAVENOUS

## 2019-12-27 MED ORDER — BUPIVACAINE HCL (PF) 0.25 % IJ SOLN
INTRAMUSCULAR | Status: AC
  Filled 2019-12-27: qty 30

## 2019-12-27 MED ORDER — MELOXICAM 7.5 MG PO TABS: 7.5000 mg | ORAL_TABLET | Freq: Two times a day (BID) | ORAL | 0 refills | Status: AC

## 2019-12-27 MED ORDER — PROPOFOL 10 MG/ML IV BOLUS
INTRAVENOUS | Status: DC | PRN
  Administered 2019-12-27: 150 mg via INTRAVENOUS
  Administered 2019-12-27: 30 mg via INTRAVENOUS

## 2019-12-27 MED ORDER — SODIUM CHLORIDE 0.9 % IR SOLN
Status: DC | PRN
  Administered 2019-12-27: 400 mL

## 2019-12-27 MED ORDER — METHOCARBAMOL 500 MG PO TABS: 500.0000 mg | ORAL_TABLET | Freq: Three times a day (TID) | ORAL | 1 refills | Status: DC | PRN

## 2019-12-27 MED ORDER — ACETAMINOPHEN 500 MG PO TABS: 1000.0000 mg | ORAL_TABLET | Freq: Three times a day (TID) | ORAL | 0 refills | Status: AC

## 2019-12-27 SURGICAL SUPPLY — 96 items
ANCH SUT 2 SUTTK 12X2.4 STRL (Anchor) ×2 IMPLANT
ANCHOR SUTURETAK 2.4X12 BIOC # (Anchor) ×4 IMPLANT
APL PRP STRL LF DISP 70% ISPRP (MISCELLANEOUS) ×1
APL SKNCLS STERI-STRIP NONHPOA (GAUZE/BANDAGES/DRESSINGS) ×1
BENZOIN TINCTURE PRP APPL 2/3 (GAUZE/BANDAGES/DRESSINGS) ×3 IMPLANT
BIT DRILL 2.5 CANN STRL (BIT) ×2 IMPLANT
BIT DRILL 3.5 CANN STRL (BIT) ×2 IMPLANT
BLADE AVERAGE 25MMX9MM (BLADE) ×1
BLADE AVERAGE 25X9 (BLADE) ×2 IMPLANT
BLADE HEX COATED 2.75 (ELECTRODE) ×3 IMPLANT
BLADE SHAVER BONE 5.0MM X 13CM (MISCELLANEOUS)
BLADE SHAVER BONE 5.0X13 (MISCELLANEOUS) IMPLANT
BLADE SURG 10 STRL SS (BLADE) ×3 IMPLANT
BLADE SURG 15 STRL LF DISP TIS (BLADE) ×1 IMPLANT
BLADE SURG 15 STRL SS (BLADE) ×3
BNDG COHESIVE 4X5 TAN STRL (GAUZE/BANDAGES/DRESSINGS) ×3 IMPLANT
BNDG ELASTIC 6X5.8 VLCR STR LF (GAUZE/BANDAGES/DRESSINGS) ×3 IMPLANT
BURR OVAL 8 FLU 4.0MM X 13CM (MISCELLANEOUS)
BURR OVAL 8 FLU 4.0X13 (MISCELLANEOUS) IMPLANT
CHLORAPREP W/TINT 26 (MISCELLANEOUS) ×3 IMPLANT
CLOSURE STERI-STRIP 1/2X4 (GAUZE/BANDAGES/DRESSINGS) ×1
CLSR STERI-STRIP ANTIMIC 1/2X4 (GAUZE/BANDAGES/DRESSINGS) ×2 IMPLANT
COOLER ICEMAN CLASSIC (MISCELLANEOUS) ×3 IMPLANT
COVER BACK TABLE 60X90IN (DRAPES) ×3 IMPLANT
COVER WAND RF STERILE (DRAPES) IMPLANT
CUFF TOURN SGL QUICK 34 (TOURNIQUET CUFF) ×3
CUFF TRNQT CYL 34X4.125X (TOURNIQUET CUFF) ×1 IMPLANT
DISSECTOR 3.5MM X 13CM CVD (MISCELLANEOUS) ×3 IMPLANT
DISSECTOR 4.0MMX13CM CVD (MISCELLANEOUS) ×2 IMPLANT
DRAPE ARTHROSCOPY W/POUCH 90 (DRAPES) ×3 IMPLANT
DRAPE C-ARM 42X72 X-RAY (DRAPES) ×3 IMPLANT
DRAPE C-ARMOR (DRAPES) ×3 IMPLANT
DRAPE IMP U-DRAPE 54X76 (DRAPES) ×3 IMPLANT
DRAPE TOP ARMCOVERS (MISCELLANEOUS) ×3 IMPLANT
DRAPE U-SHAPE 47X51 STRL (DRAPES) ×3 IMPLANT
DRSG EMULSION OIL 3X3 NADH (GAUZE/BANDAGES/DRESSINGS) IMPLANT
DRSG PAD ABDOMINAL 8X10 ST (GAUZE/BANDAGES/DRESSINGS) ×2 IMPLANT
ELECT REM PT RETURN 9FT ADLT (ELECTROSURGICAL) ×3
ELECTRODE REM PT RTRN 9FT ADLT (ELECTROSURGICAL) ×1 IMPLANT
GAUZE SPONGE 4X4 12PLY STRL (GAUZE/BANDAGES/DRESSINGS) ×4 IMPLANT
GLOVE BIO SURGEON STRL SZ 6.5 (GLOVE) ×2 IMPLANT
GLOVE BIO SURGEONS STRL SZ 6.5 (GLOVE) ×1
GLOVE ECLIPSE 8.0 STRL XLNG CF (GLOVE) ×3 IMPLANT
GLOVE SRG 8 PF TXTR STRL LF DI (GLOVE) ×1 IMPLANT
GLOVE SURG UNDER POLY LF SZ6.5 (GLOVE) ×3 IMPLANT
GLOVE SURG UNDER POLY LF SZ8 (GLOVE) ×3
GOWN STRL REUS W/ TWL LRG LVL3 (GOWN DISPOSABLE) ×2 IMPLANT
GOWN STRL REUS W/ TWL XL LVL3 (GOWN DISPOSABLE) ×1 IMPLANT
GOWN STRL REUS W/TWL LRG LVL3 (GOWN DISPOSABLE) ×6
GOWN STRL REUS W/TWL XL LVL3 (GOWN DISPOSABLE) ×6 IMPLANT
GRAFT TISS SEMITEND 4-8 (Bone Implant) IMPLANT
IMMOBILIZER KNEE 22 UNIV (SOFTGOODS) ×2 IMPLANT
IMMOBILIZER KNEE 24 THIGH 36 (MISCELLANEOUS) IMPLANT
IMMOBILIZER KNEE 24 UNIV (MISCELLANEOUS)
KIT BIO-SUTURETAK 2.4 SPR TROC (KITS) ×2 IMPLANT
KIT SUTURETAK 3 SPEAR TROCAR (KITS) ×3 IMPLANT
KIT TRANSTIBIAL (DISPOSABLE) ×5 IMPLANT
MANIFOLD NEPTUNE II (INSTRUMENTS) ×3 IMPLANT
NDL SAFETY ECLIPSE 18X1.5 (NEEDLE) ×1 IMPLANT
NDL SUT 6 .5 CRC .975X.05 MAYO (NEEDLE) IMPLANT
NEEDLE HYPO 18GX1.5 SHARP (NEEDLE) ×3
NEEDLE MAYO TAPER (NEEDLE)
PACK ARTHROSCOPY DSU (CUSTOM PROCEDURE TRAY) ×3 IMPLANT
PACK BASIN DAY SURGERY FS (CUSTOM PROCEDURE TRAY) ×3 IMPLANT
PAD COLD SHLDR WRAP-ON (PAD) ×3 IMPLANT
PENCIL SMOKE EVACUATOR (MISCELLANEOUS) ×3 IMPLANT
PORT APPOLLO RF 90DEGREE MULTI (SURGICAL WAND) IMPLANT
PUTTY DBM STAGRAFT PLUS 5CC (Putty) ×2 IMPLANT
SCREW CORT 3.5X40 LP ANKLE (Screw) ×2 IMPLANT
SCREW CORT T15 FT 50X3.5XST (Screw) IMPLANT
SCREW CORTICAL 3.5 (Screw) ×3 IMPLANT
SCREW NLOCK FMS 3.5X46 (Screw) ×2 IMPLANT
SCREW PEEK INT. 7X30 (Screw) ×2 IMPLANT
SHEET MEDIUM DRAPE 40X70 STRL (DRAPES) ×3 IMPLANT
SPONGE LAP 4X18 RFD (DISPOSABLE) IMPLANT
SPONGE SURGIFOAM ABS GEL 12-7 (HEMOSTASIS) ×2 IMPLANT
SUT FIBERWIRE #2 38 T-5 BLUE (SUTURE) ×3
SUT MNCRL AB 3-0 PS2 18 (SUTURE) IMPLANT
SUT MNCRL AB 4-0 PS2 18 (SUTURE) ×7 IMPLANT
SUT VIC AB 0 CT1 27 (SUTURE) ×9
SUT VIC AB 0 CT1 27XBRD ANBCTR (SUTURE) ×1 IMPLANT
SUT VIC AB 2-0 SH 27 (SUTURE)
SUT VIC AB 2-0 SH 27XBRD (SUTURE) IMPLANT
SUT VIC AB 3-0 SH 27 (SUTURE) ×9
SUT VIC AB 3-0 SH 27X BRD (SUTURE) ×1 IMPLANT
SUTURE FIBERWR #2 38 T-5 BLUE (SUTURE) IMPLANT
SUTURE TAPE 1.3 FIBERLOP 20 ST (SUTURE) IMPLANT
SUTURETAPE 1.3 FIBERLOOP 20 ST (SUTURE)
SYR 5ML LL (SYRINGE) ×3 IMPLANT
TAPE CLOTH 3X10 TAN LF (GAUZE/BANDAGES/DRESSINGS) IMPLANT
TENDON SEMI-TENDINOSUS (Bone Implant) ×3 IMPLANT
TOWEL GREEN STERILE FF (TOWEL DISPOSABLE) ×3 IMPLANT
TUBE SUCTION HIGH CAP CLEAR NV (SUCTIONS) ×3 IMPLANT
TUBING ARTHROSCOPY IRRIG 16FT (MISCELLANEOUS) ×3 IMPLANT
WATER STERILE IRR 1000ML POUR (IV SOLUTION) ×3 IMPLANT
WRAP KNEE MAXI GEL POST OP (GAUZE/BANDAGES/DRESSINGS) IMPLANT

## 2019-12-27 NOTE — Discharge Instructions (Signed)
NO TYLENOL OR IBUPROFEN UNTIL AFTER 4:45pm, IF NEEDED Regional Anesthesia Blocks  1. Numbness or the inability to move the "blocked" extremity may last from 3-48 hours after placement. The length of time depends on the medication injected and your individual response to the medication. If the numbness is not going away after 48 hours, call your surgeon.  2. The extremity that is blocked will need to be protected until the numbness is gone and the  Strength has returned. Because you cannot feel it, you will need to take extra care to avoid injury. Because it may be weak, you may have difficulty moving it or using it. You may not know what position it is in without looking at it while the block is in effect.  3. For blocks in the legs and feet, returning to weight bearing and walking needs to be done carefully. You will need to wait until the numbness is entirely gone and the strength has returned. You should be able to move your leg and foot normally before you try and bear weight or walk. You will need someone to be with you when you first try to ensure you do not fall and possibly risk injury.  4. Bruising and tenderness at the needle site are common side effects and will resolve in a few days.  5. Persistent numbness or new problems with movement should be communicated to the surgeon or the [facility] [phone] [facility] [phone]). Post Anesthesia Home Care Instructions  Activity: Get plenty of rest for the remainder of the day. A responsible individual must stay with you for 24 hours following the procedure.  For the next 24 hours, DO NOT: -Drive a car -Advertising copywriter -Drink alcoholic beverages -Take any medication unless instructed by your physician -Make any legal decisions or sign important papers.  Meals: Start with liquid foods such as gelatin or soup. Progress to regular foods as tolerated. Avoid greasy, spicy, heavy foods. If nausea and/or  vomiting occur, drink only clear liquids until the nausea and/or vomiting subsides. Call your physician if vomiting continues.  Special Instructions/Symptoms: Your throat may feel dry or sore from the anesthesia or the breathing tube placed in your throat during surgery. If this causes discomfort, gargle with warm salt water. The discomfort should disappear within 24 hours.  If you had a scopolamine patch placed behind your ear for the management of post- operative nausea and/or vomiting:  1. The medication in the patch is effective for 72 hours, after which it should be removed.  Wrap patch in a tissue and discard in the trash. Wash hands thoroughly with soap and water. 2. You may remove the patch earlier than 72 hours if you experience unpleasant side effects which may include dry mouth, dizziness or visual disturbances. 3. Avoid touching the patch. Wash your hands with soap and water after contact with the patch.

## 2019-12-27 NOTE — Op Note (Signed)
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  3 and half quadrants of translation preoperatively. Suprapatellar pouch: Normal Patellofemoral Compartment: Grade 3 changes on the inferior and far medial patella but part of the patellar facet were reasonable medially thus we felt that with as prominent of a J sign and reduction as she had it would be necessary to reconstruct the MPFL Medial Compartment: Normal Lateral Compartment: normal Intercondylar Notch: normal  Successful completion of the planned procedure.  Patient had a supratrochlear spur and Dejour D trochlea.  If she doesn't get the relief she needs from this surgery her revision procedure would be a PFJ if not just scope debridement.  Patient had a relatively small anterior tibia and we had to make a relatively small tubercle osteotomy rather than risk fracturing the remainder of her tibia.  This makes her risk of nonunion or loss of fixation slightly higher than the average patient.  Post-operative plan: The patient will be TDWB for 6 weeks.  The patient will be dc home.  DVT prophylaxis Aspirin 81 mg twice daily for 6 weeks.  Pain control with PRN pain medication preferring oral medicines.  Follow up plan will be scheduled in approximately 7 days for incision check and XR.  Post-Op Diagnosis: Same Surgeons:Primary: Bjorn Pippin,  MD Assistants:Caroline McBane PA-C Location: [facility] OR ROOM 6 Anesthesia: General with local Antibiotics: Ancef 2 g with local vancomycin powder 1 g at the surgical site Tourniquet time:  Total Tourniquet Time Documented: Thigh (Right) - 74 minutes Total: Thigh (Right) - 74 minutes  Estimated Blood Loss: Minimal Complications: None Specimens: None Implants: Implant Name Type Inv. Item Serial No. Manufacturer Lot No. LRB No. Used Action  SCREW PEEK INT. 7X30 - [id number] Screw SCREW PEEK INT. 7X30  [employer] [id number] Right 1 Implanted  PUTTY DBM STAGRAFT PLUS 5CC - [id number] Putty PUTTY DBM STAGRAFT PLUS 5CC  ZIMMER [employer]ON(ORTH,TRAU,BIO,SG) [id number] Right 1 Implanted  SCREW CORTICAL 3.5 - [id number] Screw SCREW CORTICAL 3.5  [employer]  Right 1 Implanted  SCREW CORT 3.5X40 LP ANKLE - [id number] Screw SCREW CORT 3.5X40 LP ANKLE  [employer]  Right 1 Implanted  SCREW NLOCK FMS 3.5X46 - [id number] Screw SCREW NLOCK FMS 3.5X46  [employer]  Right 1 Implanted    Indications for Surgery:   Sherri Cobb is a 39 y.o. female with continued patellofemoral instability refractory to years of non-operative measures.  Benefits and risks of operative and nonoperative management were discussed prior to surgery with patient/guardian(s) and informed consent form was completed.  Specific risks including infection, need for additional surgery, non-union, malunion, fracture and stiffness amongst others.   Procedure:   The patient was identified properly. Informed consent was obtained and the surgical site was marked. The patient was taken up to suite where general anesthesia was induced. The patient was placed in the supine position with a post against the surgical leg and a nonsterile tourniquet applied. The surgical leg was then prepped and draped usual sterile fashion.  A standard surgical timeout was performed.  2 standard anterior portals were made and diagnostic arthroscopy performed. Please note the  findings as noted above.  We removed a 1 x 1.2 cm loose body from the anteromedial portion of the joint.  We were able to clear the joint as above and performed a lateral release taking care to avoid damage to the crossing vessels and coagulate these to be proceeded.  Patient had chondromalacia of the patella and this was taken back to stable base with a shaver.  Once this was done we completed the scope portion of the case for now and turned attention to the tubercle.  We turned our attention to the tibial tubercle osteotomy.  We marked the proximal and distal aspect of the tubercle made a longitudinal incision about 6 cm in length just off the lateral border of the tibial tubercle.  We carried our incision down to the fascia and made full-thickness skin flaps.  The tubercle itself was skeletonized and we are able to visualize the medial and lateral aspect of the patellar tendon.  We then turned our attention to the lateral anterior compartment and we skeletonized the bone peeling off the proximal aspect of this compartment staying hard on bone to avoid damage to the muscle or the deep neurovascular structures.  There is a clear so that we are able to visualize the posterior aspect of the tibia from the lateral side.  A malleable retractor was used at that point protect posterior aspect of the tibia.  We turned our attention to the medial side and placed 2 K wires 0.062 in size and the alignment of our osteotomy.  This confirmed that osteotomy would not bridge the posterior cortex of the tibia.  Once this was performed we then used a 10 mm ACL type saw to perform our osteotomy from medial to lateral to allow for an anterior and medialized transfer of the tubercle.  We used osteotomes and saw to complete the osteotomy proximally and laterally as otherwise we would have exited the posterior aspect of the tibia due to the trajectory of our cut.  This was done as typical.  Once we are able to mobilize the  fragment we had a large fragment that was able to be shifted medially without detaching the periosteal hinge distally.  We held this in place provisionally with a K wire.  That point we used a curved osteotome to perform cortical stimulation of the medial side of the tibia that was underneath the osteotomy to prepare for bone grafting later.  The periosteum was elevated and  this was done underneath to assist and later closure the periosteum.    We then placed 3 - 3.5 mm small fragment screws made by [employer] were placed by technique to lag the osteotomy in place.  We used fluoroscopy to guide our position.  Were happy with our bite of all screws and each was countersunk to avoid hardware related pain.  A 10 mm translation was performed of the tubercle to avoid over medialization.  Gelfoam was placed at the osteotomy site to prevent bleeding and was removed once bleeding was controlled.  At that point [facility] putty was used to graft at the osteotomy site.   Attention was turned to the proximal medial patella where a proximal medial patellar skin incision was made and carried down through the skin and subcutaneous tissue.  The medial border of the patella was exposed down to layer 3.  We tagged the superficial  tissue which was consistent with the attenuated MPFL remnant.  The joint was not entered.  We then used 2 - 2.4 mm arthrex suturetak anchor placed at the proximal 25% and 50% marks of the patella from proximal to distal transversely.  These would be used to hold our graft in place using a luggage loop type suture pass.    Our graft was prepped in the form of a doubled over tibialis anterior graft that passed through a 6.101mm tunnel.   This was secured as above to the patella at its mid portion and the two loose tails were then passed under layer 2 to the medial epicondyle.  We then made a 3 cm approach starting at the medial epicondyle extending just proximal and posterior.  We took care to dissect the  superficial tissues bluntly and used blunt retraction to ensure that the neurovascular structures were out of our field.   We identified the medial epicondyle.  Blunt dissection was performed below the fascia outside of the capsule from the medial patella to the adductor tubercle.    Using a Beath pin under fluoroscopy image intensification, the Beath pin was placed at Shottles point and placed from a posterior to anterior and distal to proximal direction exiting the lateral thigh.  Good position was noted on the fluoroscopic views.  The Beath pin and the adductor tubercle was over reamed with a 38mm cannulated reamer to the far lateral femoral cortex.  The sutures from the semitendinosus graft were then passed used the Beath pin exiting laterally.  With the knee in 30 degrees of flexion, the graft was appropriately tensioned to allow for appropriate medial lateral stability with approximately 64mm of lateral translation without being excessively tight.  Excellent tension was noted.  A guidepin was then placed in the femoral tunnel and the graft was secured using a 7x30-mm Arthrex peek screw with excellent purchase noted and the medial patellofemoral ligament graft appropriately tensioned.  There was adequate medial lateral stability, but the patella was not excessively tight.  The arthroscope was placed back in the joint to check position and translation of the patella before and after graft fixation noting it to be stable and articulating within the trochlea.  The native MPFL tissue was repaired at both its patellar and femoral origins in a pants over vest style fashion to imbricate this loose tissue with 0 Vicryl.  All incisions were irrigated copiously and vancomycin powder was placed prior to closure in a multilayer fashion with absorbable suture.  Sterile dressing and a knee immobilizer type brace were placed.  The patient was awoken from general anesthesia and taken to the PACU in stable condition  without complication.    Incisions closed with absorbable suture. The patient was awoken from general anesthesia and taken to the PACU in stable condition without complication.   Alfonse Alpers, PA-C, present and scrubbed throughout the case, critical for completion in a timely fashion, and for retraction, instrumentation, closure.

## 2019-12-27 NOTE — Anesthesia Postprocedure Evaluation (Signed)
Anesthesia Post Note  Patient: LUCRETIA PENDLEY  Procedure(s) Performed: KNEE ARTHROSCOPY WITH LATERAL RELEASE; REMOVAL LOOSE FOREIGN BODY; DEBRIDEMENT/SHAVING CHONDROPLASTY (Right Knee) LIGAMENT RECONSTRUCTION KNEE EXTRA-ARTICULAR, ANTERIOR TIBIAL TUBERCLEPLASTY, DECOMPRESSION FASCIOTOMY, LEG ANTERIOR (Right Knee)     Patient location during evaluation: PACU Anesthesia Type: General Level of consciousness: awake and alert Pain management: pain level controlled Vital Signs Assessment: post-procedure vital signs reviewed and stable Respiratory status: spontaneous breathing, nonlabored ventilation and respiratory function stable Cardiovascular status: blood pressure returned to baseline and stable Postop Assessment: no apparent nausea or vomiting Anesthetic complications: no   No complications documented.  Last Vitals:  Vitals:   12/27/19 1445 12/27/19 1451  BP: 123/88   Pulse: 87 77  Resp: 15 10  Temp:    SpO2: 98% 99%    Last Pain:  Vitals:   12/27/19 1451  TempSrc:   PainSc: 2                  Marquiz Sotelo,W. EDMOND

## 2019-12-27 NOTE — Progress Notes (Signed)
Discussed pain control with Dr. Sampson Goon. Pt in phase 2 PACU and does not have orders for oral pain meds.  Okay to give IV pain meds first to get quick relief and reassess need for oral pain meds.

## 2019-12-27 NOTE — Anesthesia Preprocedure Evaluation (Addendum)
Anesthesia Evaluation  Patient identified by MRN, date of birth, ID band Patient awake    Reviewed: Allergy & Precautions, H&P , NPO status , Patient's Chart, lab work & pertinent test results  Airway Mallampati: II  TM Distance: >3 FB Neck ROM: Full    Dental no notable dental hx. (+) Teeth Intact, Dental Advisory Given   Pulmonary neg pulmonary ROS,    Pulmonary exam normal breath sounds clear to auscultation       Cardiovascular negative cardio ROS   Rhythm:Regular Rate:Normal     Neuro/Psych negative neurological ROS  negative psych ROS   GI/Hepatic negative GI ROS, Neg liver ROS,   Endo/Other  negative endocrine ROS  Renal/GU negative Renal ROS  negative genitourinary   Musculoskeletal   Abdominal   Peds  Hematology negative hematology ROS (+)   Anesthesia Other Findings   Reproductive/Obstetrics negative OB ROS                            Anesthesia Physical Anesthesia Plan  ASA: II  Anesthesia Plan: General   Post-op Pain Management:    Induction: Intravenous  PONV Risk Score and Plan: 4 or greater and Ondansetron, Dexamethasone and Midazolam  Airway Management Planned: LMA  Additional Equipment:   Intra-op Plan:   Post-operative Plan: Extubation in OR  Informed Consent: I have reviewed the patients History and Physical, chart, labs and discussed the procedure including the risks, benefits and alternatives for the proposed anesthesia with the patient or authorized representative who has indicated his/her understanding and acceptance.     Dental advisory given  Plan Discussed with: CRNA  Anesthesia Plan Comments:         Anesthesia Quick Evaluation  

## 2019-12-27 NOTE — Transfer of Care (Signed)
Immediate Anesthesia Transfer of Care Note  Patient: Sherri Cobb  Procedure(s) Performed: KNEE ARTHROSCOPY WITH LATERAL RELEASE; REMOVAL LOOSE FOREIGN BODY; DEBRIDEMENT/SHAVING CHONDROPLASTY (Right Knee) LIGAMENT RECONSTRUCTION KNEE EXTRA-ARTICULAR, ANTERIOR TIBIAL TUBERCLEPLASTY, DECOMPRESSION FASCIOTOMY, LEG ANTERIOR (Right Knee)  Patient Location: PACU  Anesthesia Type:General  Level of Consciousness: awake, alert  and oriented  Airway & Oxygen Therapy: Patient Spontanous Breathing and Patient connected to face mask oxygen  Post-op Assessment: Report given to RN and Post -op Vital signs reviewed and stable  Post vital signs: Reviewed and stable  Last Vitals:  Vitals Value Taken Time  BP 116/71 12/27/19 1422  Temp 36.6 C 12/27/19 1420  Pulse 90 12/27/19 1426  Resp 15 12/27/19 1426  SpO2 100 % 12/27/19 1426  Vitals shown include unvalidated device data.  Last Pain:  Vitals:   12/27/19 1013  TempSrc: Oral  PainSc: 0-No pain         Complications: No complications documented.

## 2019-12-27 NOTE — Interval H&P Note (Signed)
History and Physical Interval Note:  12/27/2019 10:22 AM  Sherri Cobb  has presented today for surgery, with the diagnosis of UNILATERAL PRIMARY OSTEOARTHRITIS RIGHT KNEE; LOOSE BODY IN RIGHT KNEE, CHONDROMALACIA PATELLAE RIGHT KNEE, CHRONIC INSTABILITY RIGHT KNEE.  The various methods of treatment have been discussed with the patient and family. After consideration of risks, benefits and other options for treatment, the patient has consented to  Procedure(s): KNEE ARTHROSCOPY WITH LATERAL RELEASE; REMOVAL LOOSE FOREIGN BODY; DEBRIDEMENT/SHAVING CHONDROPLASTY (Right) LIGAMENT RECONSTRUCTION KNEE EXTRA-ARTICULAR, ANTERIOR TIBIAL TUBERCLEPLASTY, DECOMPRESSION FASCIOTOMY, LEG ANTERIOR (Right) as a surgical intervention.  The patient's history has been reviewed, patient examined, no change in status, stable for surgery.  I have reviewed the patient's chart and labs.  Questions were answered to the patient's satisfaction.     Bjorn Pippin

## 2019-12-27 NOTE — Anesthesia Procedure Notes (Signed)
Procedure Name: LMA Insertion Date/Time: 12/27/2019 12:38 PM Performed by: Lauralyn Primes, CRNA Pre-anesthesia Checklist: Patient identified, Emergency Drugs available, Suction available and Patient being monitored Patient Re-evaluated:Patient Re-evaluated prior to induction Oxygen Delivery Method: Circle system utilized Preoxygenation: Pre-oxygenation with 100% oxygen Induction Type: IV induction Ventilation: Mask ventilation without difficulty LMA: LMA inserted LMA Size: 4.0 Number of attempts: 1 Airway Equipment and Method: Bite block Placement Confirmation: positive ETCO2 Tube secured with: Tape Dental Injury: Teeth and Oropharynx as per pre-operative assessment

## 2019-12-31 ENCOUNTER — Encounter (HOSPITAL_BASED_OUTPATIENT_CLINIC_OR_DEPARTMENT_OTHER): Payer: Self-pay | Admitting: Orthopaedic Surgery

## 2020-06-05 ENCOUNTER — Encounter: Payer: Self-pay | Admitting: Physician Assistant

## 2020-06-05 ENCOUNTER — Telehealth: Payer: 59 | Admitting: Physician Assistant

## 2020-06-05 DIAGNOSIS — B369 Superficial mycosis, unspecified: Secondary | ICD-10-CM

## 2020-06-05 MED ORDER — CLOTRIMAZOLE-BETAMETHASONE 1-0.05 % EX CREA: 1.0000 "application " | TOPICAL_CREAM | Freq: Every day | CUTANEOUS | 0 refills | Status: DC

## 2020-06-05 NOTE — Progress Notes (Signed)
[name], Sherri Cobb are scheduled for a virtual visit with your provider today.    Just as we do with appointments in the office, we must obtain your consent to participate.  Your consent will be active for this visit and any virtual visit you may have with one of our providers in the next 365 days.    If you have a MyChart account, I can also send a copy of this consent to you electronically.  All virtual visits are billed to your insurance company just like a traditional visit in the office.  As this is a virtual visit, video technology does not allow for your provider to perform a traditional examination.  This may limit your provider's ability to fully assess your condition.  If your provider identifies any concerns that need to be evaluated in person or the need to arrange testing such as labs, EKG, etc, we will make arrangements to do so.    Although advances in technology are sophisticated, we cannot ensure that it will always work on either your end or our end.  If the connection with a video visit is poor, we may have to switch to a telephone visit.  With either a video or telephone visit, we are not always able to ensure that we have a secure connection.   I need to obtain your verbal consent now.   Are you willing to proceed with your visit today?   Sherri Cobb has provided verbal consent on 06/05/2020 for a virtual visit (video or telephone).  Piedad Climes, PA-C 06/05/2020  9:07 AM  Virtual Visit via Video   I connected with patient on 06/05/20 at  9:15 AM EDT by a video enabled telemedicine application and verified that I am speaking with the correct person using two identifiers.  Location patient: Home Location provider: [facility] Persons participating in the virtual visit: Patient, Provider  I discussed the limitations of evaluation and management by telemedicine and the availability of in person appointments. The patient expressed understanding and agreed to  proceed.  Subjective:   HPI:   Patient presents via [facility] today complaining of a rash/wound of anterior right leg, just inferior to the knee at site of scar from previous knee surgery over the past 1/2 weeks.  Noticed starting to get red, raised bumps at the area that were itchy, now changing into a larger solitary area.  Denies any ulceration.  Notes the area will is slightly weepy from time to time.  Denies any pustular drainage or bleeding.  Has noted some smaller red bumps around the area and slightly down her leg.  Denies any pain or burning.  Denies blistering.  Just significant itch which she tries to keep from scratching.  Denies fever, chills, malaise or fatigue.  Does have history of environmental allergies so it is not uncommon for her to have dry itchy skin this time of year.  Denies any history of overt eczema.  Notes her mom has psoriasis and she always gets nervous when she has a skin rash as she is afraid of getting psoriasis herself.  ROS:   See pertinent positives and negatives per HPI.  Patient Active Problem List   Diagnosis Date Noted  . Menometrorrhagia   . Hx of vaginitis 12/10/2017    Social History   Tobacco Use  . Smoking status: Never Smoker  . Smokeless tobacco: Never Used  Substance Use Topics  . Alcohol use: Yes    Comment: Rare  Current Outpatient Medications:  .  methocarbamol (ROBAXIN) 500 MG tablet, Take 1 tablet (500 mg total) by mouth every 8 (eight) hours as needed for muscle spasms., Disp: 30 tablet, Rfl: 1 .  Multiple Vitamin (MULTIVITAMIN WITH MINERALS) TABS tablet, Take 1 tablet by mouth daily., Disp: , Rfl:  .  SUMAtriptan (IMITREX) 50 MG tablet, 2 tabs by mouth for headache May repeat in 2 hours if headache persists or recurs.  Max 200 mg/24 hours, Disp: 10 tablet, Rfl: 6  No Known Allergies  Objective:   There were no vitals taken for this visit.  Patient is well-developed, well-nourished in no acute distress.  Resting  comfortably at home.  Head is normocephalic, atraumatic.  No labored breathing.  Speech is clear and coherent with logical content.  Patient is alert and oriented at baseline.  Skin of right leg examined via video.  At the superior aspect of prior surgical scar there is a quarter sized, slightly raised area of erythema with shiny appearance.  No vesicular lesions noted.  There are several scattered satellite lesions noted around the area and distally down the leg.  No appreciable scaling noted.  Assessment and Plan:   1. Fungal infection of skin Symptoms and appearance of rash are more indicative of fungal infection of skin, especially giving small satellite lesions.  No flaking or scaling noted to indicate lesion of psoriasis.  Patient to avoid scratching the area.  She has been encouraged to avoid shaving the area until this resolves.  She is to switch out her razor.  We will start Lotrisone daily for 14 days.  Discussed if area resolves completely within a week, she still needs to continue using the medicine for a few more days after resolution to help prevent recurrence.  In regards to concerns of psoriasis, discussed with patient the sometimes early in course of this, the lesions do not always look characteristic.  If symptoms or not resolving as expected, or if there are any new or worsening symptoms, she needs in person evaluation.  Patient voiced understanding and agreement with this. - clotrimazole-betamethasone (LOTRISONE) cream; Apply 1 application topically daily.  Dispense: 30 g; Refill: 0    Piedad Climes, [place] 06/05/2020

## 2020-06-05 NOTE — Patient Instructions (Signed)
Instructions sent to patients MyChart.

## 2020-07-12 ENCOUNTER — Emergency Department (HOSPITAL_COMMUNITY): Payer: 59

## 2020-07-12 ENCOUNTER — Encounter (HOSPITAL_COMMUNITY)
Admission: EM | Disposition: A | Discharge status: Home / Self Care | Payer: Self-pay | Source: Home / Self Care | Attending: Emergency Medicine

## 2020-07-12 ENCOUNTER — Emergency Department (HOSPITAL_COMMUNITY): Payer: 59 | Admitting: Certified Registered"

## 2020-07-12 ENCOUNTER — Ambulatory Visit (HOSPITAL_COMMUNITY)
Admission: EM | Admit: 2020-07-12 | Discharge: 2020-07-12 | Disposition: A | Discharge status: Home / Self Care | Payer: 59 | Attending: Emergency Medicine | Admitting: Emergency Medicine

## 2020-07-12 ENCOUNTER — Encounter (HOSPITAL_COMMUNITY): Payer: Self-pay | Admitting: Emergency Medicine

## 2020-07-12 ENCOUNTER — Other Ambulatory Visit: Payer: Self-pay

## 2020-07-12 DIAGNOSIS — K810 Acute cholecystitis: Secondary | ICD-10-CM | POA: Diagnosis present

## 2020-07-12 DIAGNOSIS — K8010 Calculus of gallbladder with chronic cholecystitis without obstruction: Secondary | ICD-10-CM | POA: Insufficient documentation

## 2020-07-12 DIAGNOSIS — Z20822 Contact with and (suspected) exposure to covid-19: Secondary | ICD-10-CM | POA: Diagnosis not present

## 2020-07-12 DIAGNOSIS — K819 Cholecystitis, unspecified: Secondary | ICD-10-CM

## 2020-07-12 HISTORY — PX: CHOLECYSTECTOMY: SHX55

## 2020-07-12 LAB — URINALYSIS, ROUTINE W REFLEX MICROSCOPIC
Bilirubin Urine: NEGATIVE
Glucose, UA: NEGATIVE mg/dL
Hgb urine dipstick: NEGATIVE
Ketones, ur: NEGATIVE mg/dL
Leukocytes,Ua: NEGATIVE
Nitrite: NEGATIVE
Protein, ur: NEGATIVE mg/dL
Specific Gravity, Urine: 1.016 (ref 1.005–1.030)
pH: 6 (ref 5.0–8.0)

## 2020-07-12 LAB — I-STAT BETA HCG BLOOD, ED (MC, WL, AP ONLY): I-stat hCG, quantitative: <5 m[IU]/mL (ref <5)

## 2020-07-12 LAB — LIPASE, BLOOD: Lipase: 35 U/L (ref 11–51)

## 2020-07-12 LAB — COMPREHENSIVE METABOLIC PANEL
ALT: 19 U/L (ref 0–44)
AST: 19 U/L (ref 15–41)
Albumin: 4.5 g/dL (ref 3.5–5.0)
Alkaline Phosphatase: 39 U/L (ref 38–126)
Anion gap: 11 (ref 5–15)
BUN: 19 mg/dL (ref 6–20)
CO2: 26 mmol/L (ref 22–32)
Calcium: 9.6 mg/dL (ref 8.9–10.3)
Chloride: 103 mmol/L (ref 98–111)
Creatinine, Ser: 1.02 mg/dL — ABNORMAL HIGH (ref 0.44–1.00)
GFR, Estimated: >60 mL/min (ref >60)
Glucose, Bld: 127 mg/dL — ABNORMAL HIGH (ref 70–99)
Potassium: 3.8 mmol/L (ref 3.5–5.1)
Sodium: 140 mmol/L (ref 135–145)
Total Bilirubin: 0.5 mg/dL (ref 0.3–1.2)
Total Protein: 7.7 g/dL (ref 6.5–8.1)

## 2020-07-12 LAB — RESP PANEL BY RT-PCR (FLU A&B, COVID) ARPGX2
Influenza A by PCR: NEGATIVE
Influenza B by PCR: NEGATIVE
SARS Coronavirus 2 by RT PCR: NEGATIVE

## 2020-07-12 LAB — TROPONIN I (HIGH SENSITIVITY)
Troponin I (High Sensitivity): <2 ng/L (ref <18)
Troponin I (High Sensitivity): <2 ng/L (ref <18)

## 2020-07-12 LAB — CBC
HCT: 39.5 % (ref 36.0–46.0)
Hemoglobin: 13.2 g/dL (ref 12.0–15.0)
MCH: 29.5 pg (ref 26.0–34.0)
MCHC: 33.4 g/dL (ref 30.0–36.0)
MCV: 88.2 fL (ref 80.0–100.0)
Platelets: 279 10*3/uL (ref 150–400)
RBC: 4.48 MIL/uL (ref 3.87–5.11)
RDW: 12.2 % (ref 11.5–15.5)
WBC: 8.3 10*3/uL (ref 4.0–10.5)
nRBC: 0 % (ref 0.0–0.2)

## 2020-07-12 LAB — D-DIMER, QUANTITATIVE: D-Dimer, Quant: <0.27 ug/mL-FEU (ref 0.00–0.50)

## 2020-07-12 SURGERY — LAPAROSCOPIC CHOLECYSTECTOMY WITH INTRAOPERATIVE CHOLANGIOGRAM
Anesthesia: General | Site: Abdomen

## 2020-07-12 MED ORDER — ONDANSETRON HCL 4 MG/2ML IJ SOLN
4.0000 mg | Freq: Once | INTRAMUSCULAR | Status: AC
  Administered 2020-07-12: 02:00:00 4 mg via INTRAVENOUS
  Filled 2020-07-12: qty 2

## 2020-07-12 MED ORDER — EPHEDRINE 5 MG/ML INJ
INTRAVENOUS | Status: AC
  Filled 2020-07-12: qty 10

## 2020-07-12 MED ORDER — MIDAZOLAM HCL 5 MG/5ML IJ SOLN
INTRAMUSCULAR | Status: DC | PRN
  Administered 2020-07-12: 2 mg via INTRAVENOUS

## 2020-07-12 MED ORDER — ACETAMINOPHEN 10 MG/ML IV SOLN
INTRAVENOUS | Status: AC
  Administered 2020-07-12: 1000 mg via INTRAVENOUS
  Filled 2020-07-12: qty 100

## 2020-07-12 MED ORDER — SCOPOLAMINE 1 MG/3DAYS TD PT72
MEDICATED_PATCH | TRANSDERMAL | Status: DC | PRN
  Administered 2020-07-12: 1 via TRANSDERMAL

## 2020-07-12 MED ORDER — LIDOCAINE 2% (20 MG/ML) 5 ML SYRINGE
INTRAMUSCULAR | Status: DC | PRN
  Administered 2020-07-12: 40 mg via INTRAVENOUS

## 2020-07-12 MED ORDER — ROCURONIUM BROMIDE 10 MG/ML (PF) SYRINGE
PREFILLED_SYRINGE | INTRAVENOUS | Status: DC | PRN
  Administered 2020-07-12: 60 mg via INTRAVENOUS

## 2020-07-12 MED ORDER — TRAMADOL HCL 50 MG PO TABS: 50.0000 mg | ORAL_TABLET | Freq: Four times a day (QID) | ORAL | 0 refills | Status: AC | PRN

## 2020-07-12 MED ORDER — ACETAMINOPHEN 160 MG/5ML PO SOLN: 325.0000 mg | ORAL | Status: DC | PRN

## 2020-07-12 MED ORDER — ONDANSETRON HCL 4 MG/2ML IJ SOLN
INTRAMUSCULAR | Status: DC | PRN
  Administered 2020-07-12: 4 mg via INTRAVENOUS

## 2020-07-12 MED ORDER — ACETAMINOPHEN 325 MG PO TABS: 325.0000 mg | ORAL_TABLET | ORAL | Status: DC | PRN

## 2020-07-12 MED ORDER — KETOROLAC TROMETHAMINE 30 MG/ML IJ SOLN
30.0000 mg | Freq: Once | INTRAMUSCULAR | Status: AC
  Administered 2020-07-12: 30 mg via INTRAVENOUS

## 2020-07-12 MED ORDER — OXYCODONE HCL 5 MG/5ML PO SOLN
5.0000 mg | Freq: Once | ORAL | Status: DC | PRN
Start: 2020-07-12 — End: 2020-07-12

## 2020-07-12 MED ORDER — ONDANSETRON HCL 4 MG/2ML IJ SOLN
INTRAMUSCULAR | Status: AC
  Filled 2020-07-12: qty 2

## 2020-07-12 MED ORDER — LACTATED RINGERS IV SOLN: INTRAVENOUS | Status: DC | PRN

## 2020-07-12 MED ORDER — MORPHINE SULFATE (PF) 4 MG/ML IV SOLN
4.0000 mg | Freq: Once | INTRAVENOUS | Status: AC
  Administered 2020-07-12: 02:00:00 4 mg via INTRAVENOUS
  Filled 2020-07-12: qty 1

## 2020-07-12 MED ORDER — PROPOFOL 10 MG/ML IV BOLUS
INTRAVENOUS | Status: AC
  Filled 2020-07-12: qty 20

## 2020-07-12 MED ORDER — PROMETHAZINE HCL 25 MG/ML IJ SOLN: 6.2500 mg | INTRAMUSCULAR | Status: DC | PRN

## 2020-07-12 MED ORDER — ROCURONIUM BROMIDE 10 MG/ML (PF) SYRINGE
PREFILLED_SYRINGE | INTRAVENOUS | Status: AC
  Filled 2020-07-12: qty 10

## 2020-07-12 MED ORDER — FENTANYL CITRATE (PF) 250 MCG/5ML IJ SOLN
INTRAMUSCULAR | Status: AC
  Filled 2020-07-12: qty 5

## 2020-07-12 MED ORDER — FENTANYL CITRATE (PF) 100 MCG/2ML IJ SOLN
INTRAMUSCULAR | Status: DC | PRN
  Administered 2020-07-12: 50 ug via INTRAVENOUS
  Administered 2020-07-12: 100 ug via INTRAVENOUS
  Administered 2020-07-12 (×2): 50 ug via INTRAVENOUS

## 2020-07-12 MED ORDER — DEXAMETHASONE SODIUM PHOSPHATE 10 MG/ML IJ SOLN
INTRAMUSCULAR | Status: DC | PRN
  Administered 2020-07-12: 10 mg via INTRAVENOUS

## 2020-07-12 MED ORDER — DEXAMETHASONE SODIUM PHOSPHATE 10 MG/ML IJ SOLN
INTRAMUSCULAR | Status: AC
  Filled 2020-07-12: qty 1

## 2020-07-12 MED ORDER — EPHEDRINE SULFATE-NACL 50-0.9 MG/10ML-% IV SOSY
PREFILLED_SYRINGE | INTRAVENOUS | Status: DC | PRN
  Administered 2020-07-12 (×2): 10 mg via INTRAVENOUS

## 2020-07-12 MED ORDER — OXYCODONE HCL 5 MG PO TABS: 5.0000 mg | ORAL_TABLET | Freq: Once | ORAL | Status: DC | PRN

## 2020-07-12 MED ORDER — FENTANYL CITRATE (PF) 100 MCG/2ML IJ SOLN: 25.0000 ug | INTRAMUSCULAR | Status: DC | PRN

## 2020-07-12 MED ORDER — LIDOCAINE 2% (20 MG/ML) 5 ML SYRINGE
INTRAMUSCULAR | Status: AC
  Filled 2020-07-12: qty 5

## 2020-07-12 MED ORDER — SCOPOLAMINE 1 MG/3DAYS TD PT72
MEDICATED_PATCH | TRANSDERMAL | Status: AC
  Filled 2020-07-12: qty 1

## 2020-07-12 MED ORDER — MIDAZOLAM HCL 2 MG/2ML IJ SOLN
INTRAMUSCULAR | Status: AC
  Filled 2020-07-12: qty 2

## 2020-07-12 MED ORDER — FENTANYL CITRATE (PF) 100 MCG/2ML IJ SOLN
INTRAMUSCULAR | Status: AC
  Filled 2020-07-12: qty 2

## 2020-07-12 MED ORDER — AMISULPRIDE (ANTIEMETIC) 5 MG/2ML IV SOLN: 10.0000 mg | Freq: Once | INTRAVENOUS | Status: DC | PRN

## 2020-07-12 MED ORDER — SUGAMMADEX SODIUM 200 MG/2ML IV SOLN
INTRAVENOUS | Status: DC | PRN
  Administered 2020-07-12: 200 mg via INTRAVENOUS

## 2020-07-12 MED ORDER — KETOROLAC TROMETHAMINE 30 MG/ML IJ SOLN
INTRAMUSCULAR | Status: AC
  Filled 2020-07-12: qty 1

## 2020-07-12 MED ORDER — BUPIVACAINE-EPINEPHRINE 0.25% -1:200000 IJ SOLN
INTRAMUSCULAR | Status: AC
  Filled 2020-07-12: qty 1

## 2020-07-12 MED ORDER — ACETAMINOPHEN 10 MG/ML IV SOLN: 1000.0000 mg | Freq: Once | INTRAVENOUS | Status: DC | PRN

## 2020-07-12 MED ORDER — SODIUM CHLORIDE 0.9 % IV SOLN
2.0000 g | Freq: Once | INTRAVENOUS | Status: AC
  Administered 2020-07-12: 2 g via INTRAVENOUS
  Filled 2020-07-12: qty 20

## 2020-07-12 MED ORDER — SODIUM CHLORIDE 0.9 % IV BOLUS
500.0000 mL | Freq: Once | INTRAVENOUS | Status: AC
  Administered 2020-07-12: 500 mL via INTRAVENOUS

## 2020-07-12 MED ORDER — LACTATED RINGERS IV SOLN
INTRAVENOUS | Status: DC | PRN
  Administered 2020-07-12: 1000 mL

## 2020-07-12 MED ORDER — BUPIVACAINE-EPINEPHRINE 0.25% -1:200000 IJ SOLN
INTRAMUSCULAR | Status: DC | PRN
  Administered 2020-07-12: 50 mL

## 2020-07-12 MED ORDER — PROPOFOL 10 MG/ML IV BOLUS
INTRAVENOUS | Status: DC | PRN
  Administered 2020-07-12: 150 mg via INTRAVENOUS

## 2020-07-12 SURGICAL SUPPLY — 49 items
ADH SKN CLS APL DERMABOND .7 (GAUZE/BANDAGES/DRESSINGS) ×1
APL PRP STRL LF DISP 70% ISPRP (MISCELLANEOUS) ×1
APL SRG 38 LTWT LNG FL B (MISCELLANEOUS)
APPLICATOR ARISTA FLEXITIP XL (MISCELLANEOUS) IMPLANT
APPLIER CLIP 5 13 M/L LIGAMAX5 (MISCELLANEOUS) ×3
APPLIER CLIP ROT 10 11.4 M/L (STAPLE)
APR CLP MED LRG 11.4X10 (STAPLE)
APR CLP MED LRG 5 ANG JAW (MISCELLANEOUS) ×1
BAG COUNTER SPONGE SURGICOUNT (BAG) IMPLANT
BAG SPEC RTRVL LRG 6X4 10 (ENDOMECHANICALS) ×1
BAG SPNG CNTER NS LX DISP (BAG)
BAG SURGICOUNT SPONGE COUNTING (BAG)
CABLE HIGH FREQUENCY MONO STRZ (ELECTRODE) ×3 IMPLANT
CHLORAPREP W/TINT 26 (MISCELLANEOUS) ×3 IMPLANT
CLIP APPLIE 5 13 M/L LIGAMAX5 (MISCELLANEOUS) ×1 IMPLANT
CLIP APPLIE ROT 10 11.4 M/L (STAPLE) IMPLANT
COVER MAYO STAND STRL (DRAPES) IMPLANT
COVER SURGICAL LIGHT HANDLE (MISCELLANEOUS) ×3 IMPLANT
DECANTER SPIKE VIAL GLASS SM (MISCELLANEOUS) ×3 IMPLANT
DERMABOND ADVANCED (GAUZE/BANDAGES/DRESSINGS) ×2
DERMABOND ADVANCED .7 DNX12 (GAUZE/BANDAGES/DRESSINGS) ×1 IMPLANT
DISSECTOR BLUNT TIP ENDO 5MM (MISCELLANEOUS) IMPLANT
DRAPE C-ARM 42X120 X-RAY (DRAPES) IMPLANT
ELECT PENCIL ROCKER SW 15FT (MISCELLANEOUS) ×3 IMPLANT
ELECT REM PT RETURN 15FT ADLT (MISCELLANEOUS) ×3 IMPLANT
GLOVE SURG ENC MOIS LTX SZ7.5 (GLOVE) ×3 IMPLANT
GLOVE SURG UNDER LTX SZ8 (GLOVE) ×3 IMPLANT
GOWN STRL REUS W/TWL XL LVL3 (GOWN DISPOSABLE) ×6 IMPLANT
GRASPER SUT TROCAR 14GX15 (MISCELLANEOUS) IMPLANT
HEMOSTAT ARISTA ABSORB 3G PWDR (HEMOSTASIS) IMPLANT
HEMOSTAT SNOW SURGICEL 2X4 (HEMOSTASIS) IMPLANT
KIT BASIN OR (CUSTOM PROCEDURE TRAY) ×3 IMPLANT
NDL INSUFFLATION 14GA 120MM (NEEDLE) IMPLANT
NEEDLE INSUFFLATION 14GA 120MM (NEEDLE) IMPLANT
POUCH SPECIMEN RETRIEVAL 10MM (ENDOMECHANICALS) ×3 IMPLANT
SCISSORS LAP 5X35 DISP (ENDOMECHANICALS) ×3 IMPLANT
SET CHOLANGIOGRAPH MIX (MISCELLANEOUS) IMPLANT
SET IRRIG TUBING LAPAROSCOPIC (IRRIGATION / IRRIGATOR) ×3 IMPLANT
SET TUBE SMOKE EVAC HIGH FLOW (TUBING) ×3 IMPLANT
SLEEVE ADV FIXATION 5X100MM (TROCAR) ×6 IMPLANT
SUT MNCRL AB 4-0 PS2 18 (SUTURE) ×3 IMPLANT
SYR 10ML ECCENTRIC (SYRINGE) ×3 IMPLANT
TOWEL OR 17X26 10 PK STRL BLUE (TOWEL DISPOSABLE) ×3 IMPLANT
TOWEL OR NON WOVEN STRL DISP B (DISPOSABLE) IMPLANT
TRAY LAPAROSCOPIC (CUSTOM PROCEDURE TRAY) ×3 IMPLANT
TROCAR ADV FIXATION 12X100MM (TROCAR) IMPLANT
TROCAR ADV FIXATION 5X100MM (TROCAR) ×3 IMPLANT
TROCAR XCEL BLUNT TIP 100MML (ENDOMECHANICALS) ×3 IMPLANT
TROCAR XCEL NON-BLD 11X100MML (ENDOMECHANICALS) IMPLANT

## 2020-07-12 NOTE — ED Provider Notes (Addendum)
[facility]-EMERGENCY DEPT Provider Note   CSN: [id number] Arrival date & time: 07/12/20  0100     History Chief Complaint  Patient presents with   Abdominal Pain   Nausea    Mcarthur RossettiKristin L Cobb is a 40 y.o. female presents to the Emergency Department complaining of gradual, persistent, progressively worsening epigastric abdominal and chest pain onset proximately 2 hours prior to arrival.  Patient reports it started 30 minutes after eating and feels as if it radiates up into her chest and into her back.  Reports the pain is severe, pressure-like and is associated with nausea and one episode of nonbloody nonbilious emesis.  Denies a previous history of abdominal surgeries.  Denies previous issues with her gallbladder.  States she had similar symptoms several nights ago but they improved spontaneously.  Denies fever, chills, shortness of breath, weakness, dizziness, syncope, dysuria, hematuria.   The history is provided by the patient, medical records and a significant other. No language interpreter was used.      Past Medical History:  Diagnosis Date   Menometrorrhagia    Before BCPs    Patient Active Problem List   Diagnosis Date Noted   Menometrorrhagia    Hx of vaginitis 12/10/2017    Past Surgical History:  Procedure Laterality Date   KNEE ARTHROSCOPY WITH LATERAL RELEASE Right 12/27/2019   Procedure: KNEE ARTHROSCOPY WITH LATERAL RELEASE; REMOVAL LOOSE FOREIGN BODY; DEBRIDEMENT/SHAVING CHONDROPLASTY;  Surgeon: Bjorn PippinVarkey, Dax T, MD;  Location: [facility];  Service: Orthopedics;  Laterality: Right;   LASIK Bilateral 2016   TIBIAL TUBERCLERPLASTY Right 12/27/2019   Procedure: LIGAMENT RECONSTRUCTION KNEE EXTRA-ARTICULAR, ANTERIOR TIBIAL TUBERCLEPLASTY, DECOMPRESSION FASCIOTOMY, LEG ANTERIOR;  Surgeon: Bjorn PippinVarkey, Dax T, MD;  Location: [facility];  Service: Orthopedics;  Laterality: Right;   WISDOM TOOTH EXTRACTION       OB History    No obstetric history on file.     Family History  Problem Relation Age of Onset   Hyperlipidemia Mother    Depression Father    Gout Father    Kidney Stones Father    Cancer Maternal Grandmother        skin cancer    Social History   Tobacco Use   Smoking status: Never   Smokeless tobacco: Never  Vaping Use   Vaping Use: Never used  Substance Use Topics   Alcohol use: Yes    Comment: Rare   Drug use: Yes    Types: Marijuana    Home Medications Prior to Admission medications   Medication Sig Start Date End Date Taking? Authorizing Provider  clotrimazole-betamethasone (LOTRISONE) cream Apply 1 application topically daily. 06/05/20   Waldon MerlMartin, William C, PA-C  Multiple Vitamin (MULTIVITAMIN WITH MINERALS) TABS tablet Take 1 tablet by mouth daily.    [provider]  SUMAtriptan (IMITREX) 50 MG tablet 2 tabs by mouth for headache May repeat in 2 hours if headache persists or recurs.  Max 200 mg/24 hours 01/16/18   Julieanne MansonMulberry, Elizabeth, MD    Allergies    Patient has no known allergies.  Review of Systems   Review of Systems  Constitutional:  Negative for appetite change, diaphoresis, fatigue, fever and unexpected weight change.  HENT:  Negative for mouth sores.   Eyes:  Negative for visual disturbance.  Respiratory:  Negative for cough, chest tightness, shortness of breath and wheezing.   Cardiovascular:  Positive for chest pain.  Gastrointestinal:  Positive for abdominal pain, nausea and vomiting. Negative for constipation  and diarrhea.  Endocrine: Negative for polydipsia, polyphagia and polyuria.  Genitourinary:  Negative for dysuria, frequency, hematuria and urgency.  Musculoskeletal:  Positive for back pain. Negative for neck stiffness.  Skin:  Negative for rash.  Allergic/Immunologic: Negative for immunocompromised state.  Neurological:  Negative for syncope, light-headedness and headaches.  Hematological:  Does not bruise/bleed easily.   Psychiatric/Behavioral:  Negative for sleep disturbance. The patient is not nervous/anxious.    Physical Exam Updated Vital Signs BP (!) 150/105 (BP Location: Left Arm)   Pulse 76   Temp 98.1 F (36.7 C) (Oral)   Resp 20   Ht 5\' 6"  (1.676 m)   Wt 90.3 kg   SpO2 99%   BMI 32.12 kg/m   Physical Exam Vitals and nursing note reviewed.  Constitutional:      General: She is not in acute distress.    Appearance: She is not diaphoretic.     Comments: uncomfortable  HENT:     Head: Normocephalic.  Eyes:     General: No scleral icterus.    Conjunctiva/sclera: Conjunctivae normal.  Cardiovascular:     Rate and Rhythm: Normal rate and regular rhythm.     Pulses: Normal pulses.          Radial pulses are 2+ on the right side and 2+ on the left side.  Pulmonary:     Effort: No tachypnea, accessory muscle usage, prolonged expiration, respiratory distress or retractions.     Breath sounds: No stridor.     Comments: Equal chest rise. No increased work of breathing. Abdominal:     General: There is no distension.     Palpations: Abdomen is soft.     Tenderness: There is abdominal tenderness in the epigastric area. There is no right CVA tenderness, left CVA tenderness, guarding or rebound. Negative signs include Murphy's sign and McBurney's sign.  Musculoskeletal:     Cervical back: Normal range of motion.     Comments: Moves all extremities equally and without difficulty.  Skin:    General: Skin is warm and dry.     Capillary Refill: Capillary refill takes less than 2 seconds.  Neurological:     Mental Status: She is alert.     GCS: GCS eye subscore is 4. GCS verbal subscore is 5. GCS motor subscore is 6.     Comments: Speech is clear and goal oriented.  Psychiatric:        Mood and Affect: Mood normal.    ED Results / Procedures / Treatments   Labs (all labs ordered are listed, but only abnormal results are displayed) Labs Reviewed  COMPREHENSIVE METABOLIC PANEL - Abnormal;  Notable for the following components:      Result Value   Glucose, Bld 127 (*)    Creatinine, Ser 1.02 (*)    All other components within normal limits  RESP PANEL BY RT-PCR (FLU A&B, COVID) ARPGX2  LIPASE, BLOOD  CBC  URINALYSIS, ROUTINE W REFLEX MICROSCOPIC  D-DIMER, QUANTITATIVE  I-STAT BETA HCG BLOOD, ED ([facility], [facility], [facility] ONLY)  TROPONIN I (HIGH SENSITIVITY)  TROPONIN I (HIGH SENSITIVITY)    EKG EKG Interpretation  Date/Time:  Saturday July 12 2020 02:02:59 EDT Ventricular Rate:  72 PR Interval:  116 QRS Duration: 82 QT Interval:  368 QTC Calculation: 402 R Axis:   67 Text Interpretation: Normal sinus rhythm Normal ECG NO STEMI No old tracing to compare Confirmed by [birth year] [phone]) on 07/12/2020 6:03:09 AM  Radiology CT ABDOMEN PELVIS WO CONTRAST  Result Date: 07/12/2020 CLINICAL DATA:  Upper epigastric pain starting 2 hours ago. EXAM: CT ABDOMEN AND PELVIS WITHOUT CONTRAST TECHNIQUE: Multidetector CT imaging of the abdomen and pelvis was performed following the standard protocol without IV contrast. COMPARISON:  None. FINDINGS: Lower chest: Lung bases are clear. Hepatobiliary: No focal liver lesions. Cholelithiasis with large stone in the gallbladder. Minimal pericholecystic stranding could indicate acute cholecystitis. No bile duct dilatation. Pancreas: Unremarkable. No pancreatic ductal dilatation or surrounding inflammatory changes. Spleen: Normal in size without focal abnormality. Adrenals/Urinary Tract: No adrenal gland nodules. Kidneys are symmetrical. No hydronephrosis or hydroureter. No renal stones identified. Bladder is unremarkable. Stomach/Bowel: Stomach, small bowel, and colon are not abnormally distended. No wall thickening or inflammatory changes are appreciated. Appendix is normal. Vascular/Lymphatic: No significant vascular findings are present. No enlarged abdominal or pelvic lymph nodes. Reproductive: Uterus and bilateral adnexa are unremarkable. Other: No free air  or free fluid in the abdomen. Abdominal wall musculature appears intact. Musculoskeletal: Vertebral hemangioma and L3. IMPRESSION: 1. Cholelithiasis with mild stranding around the gallbladder possibly indicating cholecystitis. 2. No evidence of bowel obstruction or inflammation. Electronically Signed   By: Burman Nieves M.D.   On: 07/12/2020 03:51   US Abdomen Limited  Result Date: 07/12/2020 CLINICAL DATA:  40 year old female with epigastric pain. Gallstone with possible mild gallbladder inflammation on CT Abdomen and Pelvis earlier today. EXAM: ULTRASOUND ABDOMEN LIMITED RIGHT UPPER QUADRANT COMPARISON:  CT Abdomen and Pelvis 0337 hours. FINDINGS: Gallbladder: Shadowing 2.2 cm gallstone in the neck of the gallbladder (image 2). There is mild-to-moderate circumferential wall thickening at the body of the gallbladder (image 22). Wall thickness at the fundus appears to remain normal. No pericholecystic fluid identified. No sonographic Murphy sign elicited. Common bile duct: Diameter: 2 mm, normal. Liver: Echogenic liver (image 38). No intrahepatic biliary ductal dilatation or discrete liver lesion. Portal vein is patent on color Doppler imaging with normal direction of blood flow towards the liver. Other: Negative right kidney. IMPRESSION: 1. Gallstone measuring 2.2 cm in the neck of the gallbladder is associated with circumferential wall thickening in the body of the gallbladder. Despite absent sonographic Murphy's sign suspect Early Acute Cholecystitis. 2. No evidence of bile duct obstruction. 3. Fatty liver disease. Electronically Signed   By: Odessa Fleming M.D.   On: 07/12/2020 05:48   DG Abdomen Acute W/Chest  Result Date: 07/12/2020 CLINICAL DATA:  Mid epigastric pain starting 2 hours ago.  Nausea. EXAM: DG ABDOMEN ACUTE WITH 1 VIEW CHEST COMPARISON:  None. FINDINGS: Normal heart size and pulmonary vascularity. No focal airspace disease or consolidation in the lungs. No blunting of costophrenic angles. No  pneumothorax. Mediastinal contours appear intact. Gas and stool throughout the colon. No small or large bowel distention. No free intra-abdominal air. No abnormal air-fluid levels. No radiopaque stones. Calcified phleboliths in the pelvis. Visualized bones and soft tissue contours appear intact. IMPRESSION: No evidence of active pulmonary disease. Normal nonobstructive bowel gas pattern. Electronically Signed   By: Burman Nieves M.D.   On: 07/12/2020 02:39    Procedures Procedures   Medications Ordered in ED Medications  cefTRIAXone (ROCEPHIN) 2 g in sodium chloride 0.9 % 100 mL IVPB (has no administration in time range)  sodium chloride 0.9 % bolus 500 mL (has no administration in time range)  ondansetron (ZOFRAN) injection 4 mg (4 mg Intravenous Given 07/12/20 0155)  morphine 4 MG/ML injection 4 mg (4 mg Intravenous Given 07/12/20 0156)    ED Course  I have reviewed the triage  vital signs and the nursing notes.  Pertinent labs & imaging results that were available during my care of the patient were reviewed by me and considered in my medical decision making (see chart for details).    MDM Rules/Calculators/A&P                          Patient with epigastric abdominal and chest pain.  Considering cholecystitis, choledocholithiasis, pancreatitis, pulmonary embolism, ACS, gastritis, peptic ulcer disease.  5:56 AM Labs overall reassuring.  Troponin negative, D-dimer negative.  No elevation in AST or ALT.  Small elevation in serum creatinine.  Fluids given.  CT scan with concern for possible cholecystitis.  Ultrasound confirms same.  Will consult general surgery.  Rocephin given.  6:06 AM Discussed with general surgery, Dr. Fredricka Bonine who will evaluate the patient for admission.   Final Clinical Impression(s) / ED Diagnoses Final diagnoses:  Cholecystitis    Rx / DC Orders ED Discharge Orders     None          Mardene Sayer Boyd Kerbs 07/12/20 [id number]    Nira Conn, MD 07/12/20 1836

## 2020-07-12 NOTE — ED Triage Notes (Signed)
Patient here from home reporting mid upper epigastric pain that started x2 hours ago. Nausea no vomiting.

## 2020-07-12 NOTE — Anesthesia Procedure Notes (Signed)
Procedure Name: Intubation Date/Time: 07/12/2020 8:19 AM Performed by: Talon Regala D, CRNA Pre-anesthesia Checklist: Patient identified, Emergency Drugs available, Suction available and Patient being monitored Patient Re-evaluated:Patient Re-evaluated prior to induction Oxygen Delivery Method: Circle system utilized Preoxygenation: Pre-oxygenation with 100% oxygen Induction Type: IV induction Ventilation: Mask ventilation without difficulty Laryngoscope Size: Mac and 4 Grade View: Grade III Tube type: Oral Tube size: 7.0 mm Number of attempts: 1 Airway Equipment and Method: Stylet Placement Confirmation: ETT inserted through vocal cords under direct vision, positive ETCO2 and breath sounds checked- equal and bilateral Secured at: 21 cm Tube secured with: Tape Dental Injury: Teeth and Oropharynx as per pre-operative assessment

## 2020-07-12 NOTE — Anesthesia Postprocedure Evaluation (Signed)
Anesthesia Post Note  Patient: Sherri Cobb  Procedure(s) Performed: LAPAROSCOPIC CHOLECYSTECTOMY (Abdomen)     Patient location during evaluation: PACU Anesthesia Type: General Level of consciousness: awake and alert Pain management: pain level controlled Vital Signs Assessment: post-procedure vital signs reviewed and stable Respiratory status: spontaneous breathing, nonlabored ventilation, respiratory function stable and patient connected to nasal cannula oxygen Cardiovascular status: blood pressure returned to baseline and stable Postop Assessment: no apparent nausea or vomiting Anesthetic complications: no   No notable events documented.  Last Vitals:  Vitals:   07/12/20 1050 07/12/20 1051  BP: 122/85   Pulse: (!) 101 90  Resp: (!) 22 15  Temp:  (!) 36.1 C  SpO2: 92% 96%    Last Pain:  Vitals:   07/12/20 1051  TempSrc:   PainSc: 4                  Shelton Silvas

## 2020-07-12 NOTE — Transfer of Care (Signed)
Immediate Anesthesia Transfer of Care Note  Patient: Sherri Cobb  Procedure(s) Performed: LAPAROSCOPIC CHOLECYSTECTOMY (Abdomen)  Patient Location: PACU  Anesthesia Type:General  Level of Consciousness: awake, alert  and oriented  Airway & Oxygen Therapy: Patient Spontanous Breathing and Patient connected to face mask oxygen  Post-op Assessment: Report given to RN and Post -op Vital signs reviewed and stable  Post vital signs: Reviewed and stable  Last Vitals:  Vitals Value Taken Time  BP 131/82 07/12/20 0950  Temp 36.7 C 07/12/20 0950  Pulse 91 07/12/20 0957  Resp 15 07/12/20 0957  SpO2 99 % 07/12/20 0957  Vitals shown include unvalidated device data.  Last Pain:  Vitals:   07/12/20 0950  TempSrc:   PainSc: Asleep         Complications: No notable events documented.

## 2020-07-12 NOTE — H&P (Signed)
Surgical Evaluation  Chief Complaint: Abdominal pain  HPI: Otherwise healthy 40 year old woman who presented with progressively worsening epigastric abdominal pain which had begun about 2 hours prior to her arrival.  This began about half an hour after eating and radiates up into the chest and into her back.  This felt like a severe pressure sensation and was associated with nausea and emesis.  She did have some vaguely similar symptoms the previous night which resolved without intervention.  Denies any fever.  Currently reports that her pain is much better after receiving pain medication.  No Known Allergies  Past Medical History:  Diagnosis Date   Menometrorrhagia    Before BCPs    Past Surgical History:  Procedure Laterality Date   KNEE ARTHROSCOPY WITH LATERAL RELEASE Right 12/27/2019   Procedure: KNEE ARTHROSCOPY WITH LATERAL RELEASE; REMOVAL LOOSE FOREIGN BODY; DEBRIDEMENT/SHAVING CHONDROPLASTY;  Surgeon: Bjorn Pippin, MD;  Location: [facility];  Service: Orthopedics;  Laterality: Right;   LASIK Bilateral 2016   TIBIAL TUBERCLERPLASTY Right 12/27/2019   Procedure: LIGAMENT RECONSTRUCTION KNEE EXTRA-ARTICULAR, ANTERIOR TIBIAL TUBERCLEPLASTY, DECOMPRESSION FASCIOTOMY, LEG ANTERIOR;  Surgeon: Bjorn Pippin, MD;  Location:  SURGERY CENTER;  Service: Orthopedics;  Laterality: Right;   WISDOM TOOTH EXTRACTION      Family History  Problem Relation Age of Onset   Hyperlipidemia Mother    Depression Father    Gout Father    Kidney Stones Father    Cancer Maternal Grandmother        skin cancer    Social History   Socioeconomic History   Marital status: Domestic Partner    Spouse name: Luisa Hart   Number of children: 2   Years of education: Not on file   Highest education level: Associate degree: occupational, Scientist, product/process development, or vocational program  Occupational History   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[employer]  Tobacco Use   Smoking status: Never    Smokeless tobacco: Never  Vaping Use   Vaping Use: Never used  Substance and Sexual Activity   Alcohol use: Yes    Comment: Rare   Drug use: Yes    Types: Marijuana   Sexual activity: Yes  Other Topics Concern   Not on file  Social History Narrative   Lives in neighborhood with her 2 daughters   Long term female partner, Luisa Hart, is with them about 50% of time   Social Determinants of Corporate investment banker Strain: Not on file  Food Insecurity: Not on file  Transportation Needs: Not on file  Physical Activity: Not on file  Stress: Not on file  Social Connections: Not on file    No current facility-administered medications on file prior to encounter.   Current Outpatient Medications on File Prior to Encounter  Medication Sig Dispense Refill   clotrimazole-betamethasone (LOTRISONE) cream Apply 1 application topically daily. 30 g 0   Multiple Vitamin (MULTIVITAMIN WITH MINERALS) TABS tablet Take 1 tablet by mouth daily.     SUMAtriptan (IMITREX) 50 MG tablet 2 tabs by mouth for headache May repeat in 2 hours if headache persists or recurs.  Max 200 mg/24 hours 10 tablet 6    Review of Systems: a complete, 10pt review of systems was completed with pertinent positives and negatives as documented in the HPI  Physical Exam: Vitals:   07/12/20 0500 07/12/20 0630  BP: 114/66 122/88  Pulse: 65 67  Resp: 16 16  Temp:    SpO2: 98% 97%   Gen: A&Ox3, no distress  Eyes: lids and conjunctivae normal, no icterus. Pupils equally round and reactive to light.  Neck: supple without mass or thyromegaly Chest: respiratory effort is normal. No crepitus or tenderness on palpation of the chest. Breath sounds equal.  Cardiovascular: RRR with palpable distal pulses, no pedal edema Gastrointestinal: soft, nondistended, tender right upper quadrant and epigastrium Lymphatic: no lymphadenopathy in the neck or groin Muscoloskeletal: no clubbing or cyanosis of the fingers.  Strength is  symmetrical throughout.  Range of motion of bilateral upper and lower extremities normal without pain, crepitation or contracture. Neuro: cranial nerves grossly intact.  Sensation intact to light touch diffusely. Psych: appropriate mood and affect, normal insight/judgment intact  Skin: warm and dry   CBC Latest Ref Rng & Units 07/12/2020  WBC 4.0 - 10.5 K/uL 8.3  Hemoglobin 12.0 - 15.0 g/dL 16.1  Hematocrit 09.6 - 46.0 % 39.5  Platelets 150 - 400 K/uL 279    CMP Latest Ref Rng & Units 07/12/2020 09/15/2017  Glucose 70 - 99 mg/dL 045(W) 83  BUN 6 - 20 mg/dL 19 11  Creatinine 0.98 - 1.00 mg/dL 1.19(J) 4.78  Sodium 295 - 145 mmol/L 140 142  Potassium 3.5 - 5.1 mmol/L 3.8 5.0  Chloride 98 - 111 mmol/L 103 103  CO2 22 - 32 mmol/L 26 22  Calcium 8.9 - 10.3 mg/dL 9.6 9.9  Total Protein 6.5 - 8.1 g/dL 7.7 7.2  Total Bilirubin 0.3 - 1.2 mg/dL 0.5 0.6  Alkaline Phos 38 - 126 U/L 39 42  AST 15 - 41 U/L 19 18  ALT 0 - 44 U/L 19 16    No results found for: INR, PROTIME  Imaging: CT ABDOMEN PELVIS WO CONTRAST  Result Date: 07/12/2020 CLINICAL DATA:  Upper epigastric pain starting 2 hours ago. EXAM: CT ABDOMEN AND PELVIS WITHOUT CONTRAST TECHNIQUE: Multidetector CT imaging of the abdomen and pelvis was performed following the standard protocol without IV contrast. COMPARISON:  None. FINDINGS: Lower chest: Lung bases are clear. Hepatobiliary: No focal liver lesions. Cholelithiasis with large stone in the gallbladder. Minimal pericholecystic stranding could indicate acute cholecystitis. No bile duct dilatation. Pancreas: Unremarkable. No pancreatic ductal dilatation or surrounding inflammatory changes. Spleen: Normal in size without focal abnormality. Adrenals/Urinary Tract: No adrenal gland nodules. Kidneys are symmetrical. No hydronephrosis or hydroureter. No renal stones identified. Bladder is unremarkable. Stomach/Bowel: Stomach, small bowel, and colon are not abnormally distended. No wall thickening  or inflammatory changes are appreciated. Appendix is normal. Vascular/Lymphatic: No significant vascular findings are present. No enlarged abdominal or pelvic lymph nodes. Reproductive: Uterus and bilateral adnexa are unremarkable. Other: No free air or free fluid in the abdomen. Abdominal wall musculature appears intact. Musculoskeletal: Vertebral hemangioma and L3. IMPRESSION: 1. Cholelithiasis with mild stranding around the gallbladder possibly indicating cholecystitis. 2. No evidence of bowel obstruction or inflammation. Electronically Signed   By: Burman Nieves M.D.   On: 07/12/2020 03:51   US Abdomen Limited  Result Date: 07/12/2020 CLINICAL DATA:  40 year old female with epigastric pain. Gallstone with possible mild gallbladder inflammation on CT Abdomen and Pelvis earlier today. EXAM: ULTRASOUND ABDOMEN LIMITED RIGHT UPPER QUADRANT COMPARISON:  CT Abdomen and Pelvis 0337 hours. FINDINGS: Gallbladder: Shadowing 2.2 cm gallstone in the neck of the gallbladder (image 2). There is mild-to-moderate circumferential wall thickening at the body of the gallbladder (image 22). Wall thickness at the fundus appears to remain normal. No pericholecystic fluid identified. No sonographic Murphy sign elicited. Common bile duct: Diameter: 2 mm, normal. Liver: Echogenic liver (image 38).  No intrahepatic biliary ductal dilatation or discrete liver lesion. Portal vein is patent on color Doppler imaging with normal direction of blood flow towards the liver. Other: Negative right kidney. IMPRESSION: 1. Gallstone measuring 2.2 cm in the neck of the gallbladder is associated with circumferential wall thickening in the body of the gallbladder. Despite absent sonographic Murphy's sign suspect Early Acute Cholecystitis. 2. No evidence of bile duct obstruction. 3. Fatty liver disease. Electronically Signed   By: Odessa Fleming M.D.   On: 07/12/2020 05:48   DG Abdomen Acute W/Chest  Result Date: 07/12/2020 CLINICAL DATA:  Mid  epigastric pain starting 2 hours ago.  Nausea. EXAM: DG ABDOMEN ACUTE WITH 1 VIEW CHEST COMPARISON:  None. FINDINGS: Normal heart size and pulmonary vascularity. No focal airspace disease or consolidation in the lungs. No blunting of costophrenic angles. No pneumothorax. Mediastinal contours appear intact. Gas and stool throughout the colon. No small or large bowel distention. No free intra-abdominal air. No abnormal air-fluid levels. No radiopaque stones. Calcified phleboliths in the pelvis. Visualized bones and soft tissue contours appear intact. IMPRESSION: No evidence of active pulmonary disease. Normal nonobstructive bowel gas pattern. Electronically Signed   By: Burman Nieves M.D.   On: 07/12/2020 02:39     A/P: Acute cholecystitis. I recommend proceeding with laparoscopic cholecystectomy with possible cholangiogram. Discussed risks of surgery including bleeding, pain, scarring, intraabdominal injury specifically to the common bile duct and sequelae, bile leak, conversion to open surgery, blood clot, pneumonia, heart attack, stroke, failure to resolve symptoms, etc. Questions welcomed and answered.  Will plan for surgery this morning with Dr. Cliffton Asters.    Patient Active Problem List   Diagnosis Date Noted   Menometrorrhagia    Hx of vaginitis 12/10/2017       Phylliss Blakes, MD [facility]  See AMION to contact appropriate on-call provider

## 2020-07-12 NOTE — Discharge Instructions (Addendum)
POST OP INSTRUCTIONS  DIET: As tolerated. Follow a light bland diet the first 24 hours after arrival home, such as soup, liquids, crackers, etc.  Be sure to include lots of fluids daily.  Avoid fast food or heavy meals as your are more likely to get nauseated.  Eat a low fat the next few days after surgery.  Take your usually prescribed home medications unless otherwise directed.  PAIN CONTROL: Pain is best controlled by a usual combination of three different methods TOGETHER: Ice/Heat Over the counter pain medication Prescription pain medication Most patients will experience some swelling and bruising around the surgical site.  Ice packs or heating pads (30-60 minutes up to 6 times a day) will help. Some people prefer to use ice alone, heat alone, alternating between ice & heat.  Experiment to what works for you.  Swelling and bruising can take several weeks to resolve.   It is helpful to take an over-the-counter pain medication regularly for the first few weeks: Ibuprofen (Motrin/Advil) - 200mg tabs - take 3 tabs (600mg) every 6 hours as needed for pain Acetaminophen (Tylenol) - you may take 650mg every 6 hours as needed. You can take this with motrin as they act differently on the body. If you are taking a narcotic pain medication that has acetaminophen in it, do not take over the counter tylenol at the same time.  Iii. NOTE: You may take both of these medications together - most patients  find it most helpful when alternating between the two (i.e. Ibuprofen at 6am, tylenol at 9am, ibuprofen at 12pm ...) A  prescription for pain medication should be given to you upon discharge.  Take your pain medication as prescribed if your pain is not adequatly controlled with the over-the-counter pain reliefs mentioned above.  Avoid getting constipated.  Between the surgery and the pain medications, it is common to experience some constipation.  Increasing fluid intake and taking a fiber supplement (such as  Metamucil, Citrucel, FiberCon, MiraLax, etc) 1-2 times a day regularly will usually help prevent this problem from occurring.  A mild laxative (prune juice, Milk of Magnesia, MiraLax, etc) should be taken according to package directions if there are no bowel movements after 48 hours.    Dressing: Your incisions are covered in Dermabond which is like sterile superglue for the skin. This will come off on it's own in a couple weeks. It is waterproof and you may bathe normally starting the day after your surgery in a shower. Avoid baths/pools/lakes/oceans until your wounds have fully healed.  ACTIVITIES as tolerated:   Avoid heavy lifting (>10lbs or 1 gallon of milk) for the next 6 weeks. You may resume regular (light) daily activities beginning the next day--such as daily self-care, walking, climbing stairs--gradually increasing activities as tolerated.  If you can walk 30 minutes without difficulty, it is safe to try more intense activity such as jogging, treadmill, bicycling, low-impact aerobics.  DO NOT PUSH THROUGH PAIN.  Let pain be your guide: If it hurts to do something, don't do it. You may drive when you are no longer taking prescription pain medication, you can comfortably wear a seatbelt, and you can safely maneuver your car and apply brakes.   FOLLOW UP in our office Please call [facility] at [phone] to set up an appointment to see your surgeon in the office for a follow-up appointment approximately 2 weeks after your surgery. Make sure that you call for this appointment the day you arrive home to insure   a convenient appointment time.  9. If you have disability or family leave forms that need to be completed, you may have them completed by your primary care physician's office; for return to work instructions, please ask our office staff and they will be happy to assist you in obtaining this documentation   When to call us [phone]: Poor pain control Reactions / problems with new  medications (rash/itching, etc)  Fever over 101.5 F (38.5 C) Inability to urinate Nausea/vomiting Worsening swelling or bruising Continued bleeding from incision. Increased pain, redness, or drainage from the incision  The clinic staff is available to answer your questions during regular business hours (8:30am-5pm).  Please don't hesitate to call and ask to speak to one of our nurses for clinical concerns.   A surgeon from [facility] is always on call at the hospitals   If you have a medical emergency, go to the nearest emergency room or call 911.  [facility] [address], [address], [place], [place]  [address] MAIN: [phone] FAX: [phone] [email or web address]  

## 2020-07-12 NOTE — Anesthesia Preprocedure Evaluation (Addendum)
Anesthesia Evaluation  Patient identified by MRN, date of birth, ID band Patient awake    Reviewed: Allergy & Precautions, NPO status , Patient's Chart, lab work & pertinent test results  Airway Mallampati: I  TM Distance: >3 FB Neck ROM: Full    Dental  (+) Teeth Intact, Dental Advisory Given   Pulmonary neg pulmonary ROS,    breath sounds clear to auscultation       Cardiovascular negative cardio ROS   Rhythm:Regular Rate:Normal     Neuro/Psych negative neurological ROS  negative psych ROS   GI/Hepatic negative GI ROS, Neg liver ROS,   Endo/Other  negative endocrine ROS  Renal/GU negative Renal ROS     Musculoskeletal negative musculoskeletal ROS (+)   Abdominal Normal abdominal exam  (+)   Peds  Hematology negative hematology ROS (+)   Anesthesia Other Findings   Reproductive/Obstetrics                            Anesthesia Physical Anesthesia Plan  ASA: 2  Anesthesia Plan: General   Post-op Pain Management:    Induction: Intravenous  PONV Risk Score and Plan: 4 or greater and Ondansetron, Dexamethasone, Midazolam and Scopolamine patch - Pre-op  Airway Management Planned: Oral ETT  Additional Equipment: None  Intra-op Plan:   Post-operative Plan: Extubation in OR  Informed Consent: I have reviewed the patients History and Physical, chart, labs and discussed the procedure including the risks, benefits and alternatives for the proposed anesthesia with the patient or authorized representative who has indicated his/her understanding and acceptance.     Dental advisory given  Plan Discussed with: CRNA  Anesthesia Plan Comments:        Anesthesia Quick Evaluation  

## 2020-07-12 NOTE — Progress Notes (Signed)
I have seen and examined her this morning as well.  -The anatomy and physiology of the hepatobiliary system was discussed with the patient. The pathophysiology of gallbladder disease was discussed at length with associated pictures. -The options for treatment were discussed including ongoing observation which may result in subsequent gallbladder complications (infection, pancreatitis, choledocholithiasis, etc) and surgery - laparoscopic cholecystectomy. -The planned procedure, material risks (including, but not limited to, pain, bleeding, infection, scarring, need for blood transfusion, damage to surrounding structures- blood vessels/nerves/viscus/organs, damage to bile duct, bile leak, need for additional procedures, hernia, worsening of pre-existing medical conditions, pancreatitis, pneumonia, heart attack, stroke, death) benefits and alternatives to surgery were discussed at length. We noted a good probability that the procedure would help improve her symptoms. The patient's questions were answered to her satisfaction, she voiced understanding and elected to proceed with surgery. Additionally, we discussed typical postoperative expectations and the recovery process.  Marin Olp, MD [facility] Use [email or web address] to contact on call provider

## 2020-07-12 NOTE — Op Note (Signed)
07/12/2020 9:45 AM  PATIENT: Sherri Cobb  40 y.o. female  Patient Care Team: Julieanne Manson, MD as PCP - General (Internal Medicine)  PRE-OPERATIVE DIAGNOSIS: Acute cholecystitis  POST-OPERATIVE DIAGNOSIS: Same  PROCEDURE: Laparoscopic cholecystectomy  SURGEON: Stephanie Coup. Sophy Mesler, MD  ANESTHESIA: General endotracheal  EBL: Total I/O In: 1600 [I.V.:1000; IV Piggyback:600] Out: 5 [Blood:5]  DRAINS: None  SPECIMEN: Gallbladder  COUNTS: Sponge, needle and instrument counts were reported correct x2 at the conclusion of the operation  DISPOSITION: PACU in satisfactory condition  COMPLICATIONS: None  FINDINGS: Acutely inflamed, edematous gallbladder consistent with cholecystitis. Critical view of safety achieved before clipping or dividing any structures.  DESCRIPTION:  The patient was identified & brought into the operating room. She was then positioned supine on the OR table. SCDs were in place and active during the entire case. She then underwent general endotracheal anesthesia. Pressure points were padded. Hair on the abdomen was clipped by the OR team. The abdomen was prepped and draped in the standard sterile fashion. Antibiotics were administered. A surgical timeout was performed and confirmed our plan.   An infraumbilical incision was made. The umbilical stalk was grasped and retracted outwardly. The infraumbilical fascia was identified and incised. The peritoneal cavity was gently entered bluntly. A purse-string 0 Vicryl suture was placed. The Hasson cannula was inserted into the peritoneal cavity and insufflation with CO2 commenced to . A laparoscope was inserted into the peritoneal cavity and inspection confirmed no evidence of trocar site complications. The patient was then positioned in reverse Trendelenburg with slight left side down. 3 additional 46mm trocars were placed along the right subcostal line - one 102mm port in mid subcostal region, another 38mm port  in the right flank near the anterior axillary line, and a third 44mm port in the left subxiphoid region obliquely near the falciform ligament.  The liver and gallbladder were inspected. Some mild blunting of the liver edges is apparent consistent with fatty liver disease but is otherwise normal in appearance. The gallbladder is edematous and inflamed consistent with acute cholecystitis. The gallbladder fundus was grasped and elevated cephalad. An additional grasper was then placed on the infundibulum of the gallbladder and the infundibulum was retracted laterally. Staying high on the gallbladder, the peritoneum on both sides of the gallbladder was opened with hook cautery. Gentle blunt dissection was then employed with a Art gallery manager working down into [employer]. The cystic duct was identified and carefully circumferentially dissected. The cystic artery was also identified and carefully circumferentially dissected. The space between the cystic artery and hepatocystic plate was developed such that a good view of the liver could be seen through a window medial to the cystic artery. The triangle of Calot had been cleared of all fibrofatty tissue. At this point, a critical view of safety was achieved and the only structures visualized was the skeletonized cystic duct laterally, the skeletonized cystic artery and the liver through the window medial to the artery. No posterior cystic artery was noted.  A cholangiogram was not performed at this point as her cystic duct is relatively diminutive in size and fragile.  There was concern for potential cystic duct avulsion with attempted cholangiogram catheter advancement following a partial cystic 'ductotomy.'  The cystic duct and artery were clipped with 2 clips on the patient side and 1 clip on the specimen side. The cystic duct and artery were then divided. The gallbladder was then freed from its remaining attachments to the liver using electrocautery and  placed into  an endocatch bag. The RUQ was gently irrigated with sterile saline. Hemostasis was then verified. The clips were in good position; the gallbladder fossa was dry. The rest of the abdomen was inspected no injury nor bleeding elsewhere was identified.  The endocatch bag containing the gallbladder was then removed from the umbilical port site and passed off as specimen. The RUQ ports were removed under direct visualization and noted to be hemostatic. The umbilical fascia was then closed using the 0 Vicryl purse-string suture. The fascia was palpated and noted to be completely closed. The skin of all incision sites was approximated with 4-0 monocryl subcuticular suture and dermabond applied. She was then awakened from anesthesia, extubated, and transferred to a stretcher for transport to PACU in satisfactory condition.

## 2020-07-13 ENCOUNTER — Encounter (HOSPITAL_COMMUNITY): Payer: Self-pay | Admitting: Surgery

## 2020-07-15 ENCOUNTER — Telehealth: Payer: 59 | Admitting: Physician Assistant

## 2020-07-15 DIAGNOSIS — R519 Headache, unspecified: Secondary | ICD-10-CM

## 2020-07-15 LAB — SURGICAL PATHOLOGY

## 2020-07-15 NOTE — Progress Notes (Signed)
Based on what you shared with me, I feel your condition warrants further evaluation and I recommend that you be seen in a face to face visit. We do not evaluate or treat migraine via e-visit. Giving the vision changes and level of pain, a video urgent care visit would also not be appropriate. We recommend you be evaluated in person this morning either with your PCP office or at a local Urgent Care/ER facility.    NOTE: There will be NO CHARGE for this eVisit   If you are having a true medical emergency please call 911.      For an urgent face to face visit, [facility] has six urgent care centers for your convenience:     [facility] at [facility] Directions [phone] [address] [place], [place] [address]    [facility]) Get Driving Directions [phone] [address] [place], [place] [address]  [facility]) Get Driving Directions [phone] [address] [place],  [place]  [address]  [facility] Urgent Care at [facility] Get Driving Directions [phone] [address] [place], [place] [address]   [facility] Urgent Care at [facility] Get Driving Directions  [phone] [address] [place], [place] [address]   [facility] Care at [facility] Directions [phone] [address], [address] [place], [place] [address]  Your MyChart E-visit questionnaire answers were reviewed by a board certified advanced clinical practitioner to complete your personal care plan based on your specific symptoms.  Thank you for using e-Visits.

## 2020-07-17 LAB — RESULTS CONSOLE HPV: CHL HPV: NEGATIVE

## 2020-07-17 LAB — HM PAP SMEAR: HM Pap smear: NORMAL

## 2020-09-28 DIAGNOSIS — Z0289 Encounter for other administrative examinations: Secondary | ICD-10-CM

## 2020-10-14 ENCOUNTER — Other Ambulatory Visit: Payer: Self-pay

## 2020-10-14 ENCOUNTER — Encounter (INDEPENDENT_AMBULATORY_CARE_PROVIDER_SITE_OTHER): Payer: Self-pay

## 2020-10-14 ENCOUNTER — Ambulatory Visit (INDEPENDENT_AMBULATORY_CARE_PROVIDER_SITE_OTHER): Payer: 59 | Admitting: Family Medicine

## 2020-10-30 ENCOUNTER — Ambulatory Visit (INDEPENDENT_AMBULATORY_CARE_PROVIDER_SITE_OTHER): Payer: Self-pay | Admitting: Family Medicine

## 2020-11-13 ENCOUNTER — Other Ambulatory Visit: Payer: Self-pay

## 2020-11-13 ENCOUNTER — Encounter (INDEPENDENT_AMBULATORY_CARE_PROVIDER_SITE_OTHER): Payer: Self-pay | Admitting: Bariatrics

## 2020-11-13 ENCOUNTER — Ambulatory Visit (INDEPENDENT_AMBULATORY_CARE_PROVIDER_SITE_OTHER): Payer: 59 | Admitting: Bariatrics

## 2020-11-13 VITALS — BP 116/78 | HR 71 | Temp 98.3°F | Ht 66.0 in | Wt 188.0 lb

## 2020-11-13 DIAGNOSIS — R5383 Other fatigue: Secondary | ICD-10-CM

## 2020-11-13 DIAGNOSIS — Z1331 Encounter for screening for depression: Secondary | ICD-10-CM

## 2020-11-13 DIAGNOSIS — R7309 Other abnormal glucose: Secondary | ICD-10-CM

## 2020-11-13 DIAGNOSIS — R0602 Shortness of breath: Secondary | ICD-10-CM | POA: Diagnosis not present

## 2020-11-13 DIAGNOSIS — E6609 Other obesity due to excess calories: Secondary | ICD-10-CM

## 2020-11-13 DIAGNOSIS — K760 Fatty (change of) liver, not elsewhere classified: Secondary | ICD-10-CM | POA: Diagnosis not present

## 2020-11-13 DIAGNOSIS — R600 Localized edema: Secondary | ICD-10-CM | POA: Diagnosis not present

## 2020-11-13 DIAGNOSIS — E669 Obesity, unspecified: Secondary | ICD-10-CM

## 2020-11-13 DIAGNOSIS — Z6830 Body mass index (BMI) 30.0-30.9, adult: Secondary | ICD-10-CM

## 2020-11-13 DIAGNOSIS — E559 Vitamin D deficiency, unspecified: Secondary | ICD-10-CM

## 2020-11-13 DIAGNOSIS — Z6831 Body mass index (BMI) 31.0-31.9, adult: Secondary | ICD-10-CM

## 2020-11-13 NOTE — Progress Notes (Signed)
Chief Complaint:   OBESITY Sherri Cobb (MR# [id number]) is a 40 y.o. female who presents for evaluation and treatment of obesity and related comorbidities. Current BMI is Body mass index is 30.34 kg/m. Sherri Cobb has been struggling with her weight for many years and has been unsuccessful in either losing weight, maintaining weight loss, or reaching her healthy weight goal.  Sherri Cobb is currently in the action stage of change and ready to dedicate time achieving and maintaining a healthier weight. Sherri Cobb is interested in becoming our patient and working on intensive lifestyle modifications including (but not limited to) diet and exercise for weight loss.  Sherri Cobb is lactose intolerant.  She sometimes likes to cook, but notes time as an obstacle.  She craves food in the evening.  Sherri Cobb's habits were reviewed today and are as follows: Her family eats meals together, she struggles with family and or coworkers weight loss sabotage, her desired weight loss is 51 pounds, she has been heavy most of her life, she started gaining weight during pregnancy, her heaviest weight ever was 200 pounds, she craves chocolate and sweets, she snacks frequently in the evenings, she wakes up sometimes in the middle of the night to eat, she skips breakfast frequently, she is frequently drinking liquids with calories, she frequently makes poor food choices, she frequently eats larger portions than normal, and she struggles with emotional eating.  Depression Screen Sherri Cobb's Food and Mood (modified PHQ-9) score was 12.  Depression screen PHQ 2/9 11/13/2020  Decreased Interest 2  Down, Depressed, Hopeless 1  PHQ - 2 Score 3  Altered sleeping 2  Tired, decreased energy 3  Change in appetite 1  Feeling bad or failure about yourself  2  Trouble concentrating 1  Moving slowly or fidgety/restless 0  Suicidal thoughts 0  PHQ-9 Score 12  Difficult doing work/chores Somewhat difficult   Subjective:   1. Other  fatigue Sherri Cobb admits to daytime somnolence and reports waking up still tired. Patent has a history of symptoms of daytime fatigue, morning fatigue, and snoring. Sherri Cobb generally gets 7 or 8 hours of sleep per night, and states that she has generally restful sleep. Snoring is present. Apneic episodes are present. Epworth Sleepiness Score is 15.  Occurs with certain activities (cold intolerance).  2. SOB (shortness of breath) on exertion Sherri Cobb notes increasing shortness of breath with exercising and seems to be worsening over time with weight gain. She notes getting out of breath sooner with activity than she used to. This has gotten worse recently. Sherri Cobb denies shortness of breath at rest or orthopnea.  Occurs with certain activities.  3. Fatty liver US abdomen showed fatty liver disease.  4. Lower extremity edema Lower extremities, increased swelling.  5. Elevated glucose Glucose 127.  6. Vitamin D deficiency She is not on a vitamin D supplement at this time.  7. Depression screen Sherri Cobb was screened for depression as part of her new patient workup today.  PHQ-9 is 12.  Assessment/Plan:   1. Other fatigue Sherri Cobb does feel that her weight is causing her energy to be lower than it should be. Fatigue may be related to obesity, depression or many other causes. Labs will be ordered, and in the meanwhile, Sherri Cobb will focus on self care including making healthy food choices, increasing physical activity and focusing on stress reduction.  Gradually increase activities.  - EKG 12-Lead  2. SOB (shortness of breath) on exertion Sherri Cobb does feel that she gets out of breath more  easily that she used to when she exercises. Sherri Cobb's shortness of breath appears to be obesity related and exercise induced. She has agreed to work on weight loss and gradually increase exercise to treat her exercise induced shortness of breath. Will continue to monitor closely.  Gradually increase activities.   Check thyroid panel today.  - T3 - T4, free - TSH  3. Fatty liver Will check lipid panel.  She will work on weight loss and exercise.  - Comprehensive metabolic panel - Lipid Panel With LDL/HDL Ratio  4. Lower extremity edema Increase water intake.  5. Elevated glucose Check A1c and insulin level today.  - Comprehensive metabolic panel - Hemoglobin A1c - Insulin, random - Lipid Panel With LDL/HDL Ratio  6. Vitamin D deficiency Will check vitamin D level today.  - VITAMIN D 25 Hydroxy (Vit-D Deficiency, Fractures)  7. Depression screen Sherri Cobb had a positive depression screening. Depression is commonly associated with obesity and often results in emotional eating behaviors. We will monitor this closely and work on CBT to help improve the non-hunger eating patterns. Referral to Psychology may be required if no improvement is seen as she continues in our clinic.  8. Class 1 obesity with serious comorbidity and body mass index (BMI) of 30.0 to 30.9 in adult, unspecified obesity type  Sherri Cobb is currently in the action stage of change and her goal is to continue with weight loss efforts. I recommend Sherri Cobb begin the structured treatment plan as follows:  She has agreed to the Category 2 Plan.  She will work on meal planning and intentional eating.  We reviewed labs from 07/12/2020, including CMP, CBC, and glucose.  Exercise goals: No exercise has been prescribed at this time.   Behavioral modification strategies: increasing lean protein intake, decreasing simple carbohydrates, increasing vegetables, increasing water intake, decreasing eating out, no skipping meals, meal planning and cooking strategies, keeping healthy foods in the home, and planning for success.  She was informed of the importance of frequent follow-up visits to maximize her success with intensive lifestyle modifications for her multiple health conditions. She was informed we would discuss her lab results at her  next visit unless there is a critical issue that needs to be addressed sooner. Sherri Cobb agreed to keep her next visit at the agreed upon time to discuss these results.  Objective:   Blood pressure 116/78, pulse 71, temperature 98.3 F (36.8 C), height 5\' 6"  (1.676 m), weight 188 lb (85.3 kg), last menstrual period 11/11/2020, SpO2 96 %. Body mass index is 30.34 kg/m.  EKG: Normal sinus rhythm, rate 83 bpm.  Indirect Calorimeter completed today shows a VO2 of 223 and a REE of 1541.  Her calculated basal metabolic rate is 13/01/2020 thus her basal metabolic rate is worse than expected.  General: Cooperative, alert, well developed, in no acute distress. HEENT: Conjunctivae and lids unremarkable. Cardiovascular: Regular rhythm.  Lungs: Normal work of breathing. Neurologic: No focal deficits.   Lab Results  Component Value Date   CREATININE 1.02 (H) 07/12/2020   BUN 19 07/12/2020   NA 140 07/12/2020   K 3.8 07/12/2020   CL 103 07/12/2020   CO2 26 07/12/2020   Lab Results  Component Value Date   ALT 19 07/12/2020   AST 19 07/12/2020   ALKPHOS 39 07/12/2020   BILITOT 0.5 07/12/2020   Lab Results  Component Value Date   CHOL 224 (H) 09/15/2017   HDL 66 09/15/2017   LDLCALC 140 (H) 09/15/2017   TRIG 89  09/15/2017   Lab Results  Component Value Date   WBC 8.3 07/12/2020   HGB 13.2 07/12/2020   HCT 39.5 07/12/2020   MCV 88.2 07/12/2020   PLT 279 07/12/2020   Attestation Statements:   Reviewed by clinician on day of visit: allergies, medications, problem list, medical history, surgical history, family history, social history, and previous encounter notes.  I, Insurance claims handler, CMA, am acting as Energy manager for [employer], DO  I have reviewed the above documentation for accuracy and completeness, and I agree with the above. Corinna Capra, DO

## 2020-11-14 ENCOUNTER — Encounter: Payer: Self-pay | Admitting: Family Medicine

## 2020-11-14 ENCOUNTER — Ambulatory Visit (INDEPENDENT_AMBULATORY_CARE_PROVIDER_SITE_OTHER): Payer: 59 | Admitting: Family Medicine

## 2020-11-14 VITALS — BP 93/68 | HR 81 | Temp 98.5°F | Ht 65.75 in | Wt 191.0 lb

## 2020-11-14 DIAGNOSIS — L409 Psoriasis, unspecified: Secondary | ICD-10-CM | POA: Insufficient documentation

## 2020-11-14 DIAGNOSIS — R5383 Other fatigue: Secondary | ICD-10-CM

## 2020-11-14 DIAGNOSIS — Z7689 Persons encountering health services in other specified circumstances: Secondary | ICD-10-CM

## 2020-11-14 DIAGNOSIS — Z8619 Personal history of other infectious and parasitic diseases: Secondary | ICD-10-CM | POA: Diagnosis not present

## 2020-11-14 DIAGNOSIS — G43909 Migraine, unspecified, not intractable, without status migrainosus: Secondary | ICD-10-CM | POA: Insufficient documentation

## 2020-11-14 LAB — COMPREHENSIVE METABOLIC PANEL
ALT: 11 IU/L (ref 0–32)
AST: 15 IU/L (ref 0–40)
Albumin/Globulin Ratio: 1.7 (ref 1.2–2.2)
Albumin: 4.8 g/dL (ref 3.8–4.8)
Alkaline Phosphatase: 49 IU/L (ref 44–121)
BUN/Creatinine Ratio: 15 (ref 9–23)
BUN: 14 mg/dL (ref 6–20)
Bilirubin Total: 0.2 mg/dL (ref 0.0–1.2)
CO2: 20 mmol/L (ref 20–29)
Calcium: 9.9 mg/dL (ref 8.7–10.2)
Chloride: 103 mmol/L (ref 96–106)
Creatinine, Ser: 0.94 mg/dL (ref 0.57–1.00)
Globulin, Total: 2.8 g/dL (ref 1.5–4.5)
Glucose: 92 mg/dL (ref 70–99)
Potassium: 4.8 mmol/L (ref 3.5–5.2)
Sodium: 138 mmol/L (ref 134–144)
Total Protein: 7.6 g/dL (ref 6.0–8.5)
eGFR: 80 mL/min/{1.73_m2} (ref >59)

## 2020-11-14 LAB — LIPID PANEL WITH LDL/HDL RATIO
Cholesterol, Total: 196 mg/dL (ref 100–199)
HDL: 50 mg/dL (ref >39)
LDL Chol Calc (NIH): 129 mg/dL — ABNORMAL HIGH (ref 0–99)
LDL/HDL Ratio: 2.6 ratio (ref 0.0–3.2)
Triglycerides: 96 mg/dL (ref 0–149)
VLDL Cholesterol Cal: 17 mg/dL (ref 5–40)

## 2020-11-14 LAB — HEMOGLOBIN A1C
Est. average glucose Bld gHb Est-mCnc: 100 mg/dL
Hgb A1c MFr Bld: 5.1 % (ref 4.8–5.6)

## 2020-11-14 LAB — INSULIN, RANDOM: INSULIN: 13.2 u[IU]/mL (ref 2.6–24.9)

## 2020-11-14 LAB — TSH: TSH: 2.15 u[IU]/mL (ref 0.450–4.500)

## 2020-11-14 LAB — VITAMIN D 25 HYDROXY (VIT D DEFICIENCY, FRACTURES): Vit D, 25-Hydroxy: 30.9 ng/mL (ref 30.0–100.0)

## 2020-11-14 LAB — T3: T3, Total: 152 ng/dL (ref 71–180)

## 2020-11-14 LAB — T4, FREE: Free T4: 1.14 ng/dL (ref 0.82–1.77)

## 2020-11-14 MED ORDER — FLUOCINONIDE 0.05 % EX OINT: 1.0000 "application " | TOPICAL_OINTMENT | Freq: Two times a day (BID) | CUTANEOUS | 0 refills | Status: DC

## 2020-11-14 NOTE — Patient Instructions (Signed)
Great to see you today.  I have refilled the medication(s) we provide.   If labs were collected, we will inform you of lab results once received either by echart message or telephone call.   - echart message- for normal results that have been seen by the patient already.   - telephone call: abnormal results or if patient has not viewed results in their echart.  

## 2020-11-14 NOTE — Progress Notes (Signed)
Patient ID: Sherri Cobb, female  DOB: [birth year], 40 y.o.   MRN: [id number] Patient Care Team    Relationship Specialty Notifications Start End  Natalia Leatherwood, DO PCP - General Family Medicine  11/14/20   Roswell Nickel, DO Consulting Physician Bariatrics  11/14/20   Bjorn Pippin, MD Consulting Physician Orthopedic Surgery  11/14/20   [place] [facility] Nurse Practitioner Gynecology  11/14/20    Comment: Eveline Keto, NP    Chief Complaint  Patient presents with   Establish Care   Rash    Pt c/o rash above left knee x 2 mos;     Subjective: Sherri Cobb is a 40 y.o.  female present for new patient establishment. All past medical history, surgical history, allergies, family history, immunizations, medications and social history were updated in the electronic medical record today. All recent labs, ED visits and hospitalizations within the last year were reviewed.   Rash:  Pt reports she has had an area on her left thigh that was red, raised, dry scaly and itchy. She has been using a cream she had for ?yeast of the skin fungal/steroid combo. She states it has helped some and it the area is not as red, but is still present.  Fhx: psoriasis in her mother.   Frequent infections/fatigue Pt reports she has had frequent uti and yeast infections. She also had a skin infection after surgery and an uterine infection. She had recent cbc, cmp, tsh, lipids and a1c at her bariatric doctors which were normal.    Depression screen [facility] 2/9 11/13/2020 09/15/2017  Decreased Interest 2 0  Down, Depressed, Hopeless 1 0  PHQ - 2 Score 3 0  Altered sleeping 2 0  Tired, decreased energy 3 3  Change in appetite 1 0  Feeling bad or failure about yourself  2 0  Trouble concentrating 1 0  Moving slowly or fidgety/restless 0 0  Suicidal thoughts 0 0  PHQ-9 Score 12 3  Difficult doing work/chores Somewhat difficult -   No flowsheet data found.     No flowsheet data  found.  Immunization History  Administered Date(s) Administered   Influenza-Unspecified 12/10/2017, 09/14/2018, 10/11/2020   PFIZER(Purple Top)SARS-COV-2 Vaccination 03/23/2019, 04/16/2019, 12/15/2019   Pfizer Covid-19 Vaccine Bivalent Booster 40yrs & up 11/02/2020   Tdap 01/11/2014    No results found.  Past Medical History:  Diagnosis Date   Allergies    Back pain    Chicken pox    Fatty liver    Gallbladder problem    Hx of vaginitis 12/10/2017   Recurrent yeast with Mirena IUD Reported history of uterine infection, but unable to find that in her Care Everywhere chart.   Lactose intolerance    Menometrorrhagia    Before BCPs   Migraines    Psoriasis    No Known Allergies Past Surgical History:  Procedure Laterality Date   CHOLECYSTECTOMY N/A 07/12/2020   Procedure: LAPAROSCOPIC CHOLECYSTECTOMY;  Surgeon: Andria Meuse, MD;  Location: [facility];  Service: General;  Laterality: N/A;   KNEE ARTHROSCOPY WITH LATERAL RELEASE Right 12/27/2019   Procedure: KNEE ARTHROSCOPY WITH LATERAL RELEASE; REMOVAL LOOSE FOREIGN BODY; DEBRIDEMENT/SHAVING CHONDROPLASTY;  Surgeon: Bjorn Pippin, MD;  Location: [facility];  Service: Orthopedics;  Laterality: Right;   LASIK Bilateral 2016   TIBIAL TUBERCLERPLASTY Right 12/27/2019   Procedure: LIGAMENT RECONSTRUCTION KNEE EXTRA-ARTICULAR, ANTERIOR TIBIAL TUBERCLEPLASTY, DECOMPRESSION FASCIOTOMY, LEG ANTERIOR;  Surgeon: Bjorn Pippin, MD;  Location:  [facility];  Service: Orthopedics;  Laterality: Right;   WISDOM TOOTH EXTRACTION     Family History  Problem Relation Age of Onset   Hypertension Mother    Hyperlipidemia Mother    Anxiety disorder Mother    Sleep apnea Mother    Thyroid disease Father    Depression Father    Gout Father    Kidney Stones Father    Miscarriages / Stillbirths Sister    Anxiety disorder Daughter    Depression Daughter    Hypertension Maternal Grandmother    Depression Maternal  Grandmother    Skin cancer Maternal Grandmother    Diverticulitis Maternal Grandmother    Social History   Social History Narrative   Marital status/children/pets: Single/Div., 2 daughters   Education/employment: associates degree, administrative work   Field seismologist:      -smoke alarm in the home:Yes     - wears seatbelt: Yes     - Feels safe in their relationships: Yes       Allergies as of 11/14/2020   No Known Allergies      Medication List        Accurate as of November 14, 2020  2:02 PM. If you have any questions, ask your nurse or doctor.          STOP taking these medications    SUMAtriptan 50 MG tablet Commonly known as: IMITREX Stopped by: Felix Pacini, DO       TAKE these medications    fluocinonide ointment 0.05 % Commonly known as: LIDEX Apply 1 application topically 2 (two) times daily. Started by: Felix Pacini, DO   magnesium 30 MG tablet Take 30 mg by mouth 2 (two) times daily.   multivitamin with minerals Tabs tablet Take 1 tablet by mouth daily.   Turmeric 400 MG Caps Take by mouth.        All past medical history, surgical history, allergies, family history, immunizations andmedications were updated in the EMR today and reviewed under the history and medication portions of their EMR.      CT ABDOMEN PELVIS WO CONTRAST Result Date: 07/12/2020  IMPRESSION: 1. Cholelithiasis with mild stranding around the gallbladder possibly indicating cholecystitis. 2. No evidence of bowel obstruction or inflammation. Electronically Signed   By: Burman Nieves M.D.   On: 07/12/2020 03:51   US Abdomen Limited Result Date: 07/12/2020 IMPRESSION: 1. Gallstone measuring 2.2 cm in the neck of the gallbladder is associated with circumferential wall thickening in the body of the gallbladder. Despite absent sonographic Murphy's sign suspect Early Acute Cholecystitis. 2. No evidence of bile duct obstruction. 3. Fatty liver disease. Electronically Signed   By: Odessa Fleming  M.D.   On: 07/12/2020 05:48   DG Abdomen Acute W/Chest Result Date: 07/12/2020 IMPRESSION: No evidence of active pulmonary disease. Normal nonobstructive bowel gas pattern. Electronically Signed   By: Burman Nieves M.D.   On: 07/12/2020 02:39     ROS: 14 pt review of systems performed and negative (unless mentioned in an HPI)  Objective: BP 93/68   Pulse 81   Temp 98.5 F (36.9 C) (Oral)   Ht 5' 5.75" (1.67 m)   Wt 191 lb (86.6 kg)   LMP 11/11/2020   SpO2 96%   BMI 31.06 kg/m  Gen: Afebrile. No acute distress. Nontoxic in appearance, well-developed, well-nourished,  very pleasant female.  HENT: AT. [place].  Eyes:Pupils Equal Round Reactive to light, Extraocular movements intact,  Conjunctiva without redness, discharge or icterus. Skin: ~  3-4 cm round shaped dry scaly mildly raised skin lesion left ant. Thigh. No purpura or petechiae. Warm and well-perfused. Skin intact. Neuro/Msk:Normal gait. PERLA. EOMi. Alert. Oriented x3.   Psych: Normal affect, dress and demeanor. Normal speech. Normal thought content and judgment.  Assessment/plan: Sherri Cobb is a 40 y.o. female present for est care Psoriasis Possibly psoriasis- fhx present in mother. She has seen at least some improvement with antifungal/steroid cream over the last few weeks.  Discussed skin bx or derm referral if lesion does not respond to tx or more areas occur.  She understands if lesion worsens with steroid> she is to DC and call in. This would indicate it is fungal and would need to switch tx plan/cream. She reports understanding.   Frequent infections/fatigue Uncertain cause of her symptoms. Routine labs normal.  Would consider work for [employer] and/or immune def causes for fatigue/infections.  Briefly discussed Hibiclens use if she continues to have recurrent skin infections.  If desiring further work up on potential cause she will schedule.    Return for CPE (30 min), CMC (30 min).  No orders of the defined types  were placed in this encounter.  Meds ordered this encounter  Medications   fluocinonide ointment (LIDEX) 0.05 %    Sig: Apply 1 application topically 2 (two) times daily.    Dispense:  30 g    Refill:  0   Referral Orders  No referral(s) requested today     Note is dictated utilizing voice recognition software. Although note has been proof read prior to signing, occasional typographical errors still can be missed. If any questions arise, please do not hesitate to call for verification.  Electronically signed by: Felix Pacini, DO [facility]

## 2020-11-17 ENCOUNTER — Encounter (INDEPENDENT_AMBULATORY_CARE_PROVIDER_SITE_OTHER): Payer: Self-pay | Admitting: Bariatrics

## 2020-11-17 DIAGNOSIS — E6609 Other obesity due to excess calories: Secondary | ICD-10-CM | POA: Insufficient documentation

## 2020-11-17 DIAGNOSIS — E669 Obesity, unspecified: Secondary | ICD-10-CM | POA: Insufficient documentation

## 2020-11-17 DIAGNOSIS — Z6830 Body mass index (BMI) 30.0-30.9, adult: Secondary | ICD-10-CM | POA: Insufficient documentation

## 2020-11-27 ENCOUNTER — Other Ambulatory Visit: Payer: Self-pay

## 2020-11-27 ENCOUNTER — Ambulatory Visit (INDEPENDENT_AMBULATORY_CARE_PROVIDER_SITE_OTHER): Payer: 59 | Admitting: Bariatrics

## 2020-11-27 ENCOUNTER — Encounter (INDEPENDENT_AMBULATORY_CARE_PROVIDER_SITE_OTHER): Payer: Self-pay | Admitting: Bariatrics

## 2020-11-27 VITALS — BP 134/83 | HR 85 | Temp 97.9°F | Ht 66.0 in | Wt 190.0 lb

## 2020-11-27 DIAGNOSIS — Z6830 Body mass index (BMI) 30.0-30.9, adult: Secondary | ICD-10-CM | POA: Diagnosis not present

## 2020-11-27 DIAGNOSIS — E669 Obesity, unspecified: Secondary | ICD-10-CM

## 2020-11-27 DIAGNOSIS — E559 Vitamin D deficiency, unspecified: Secondary | ICD-10-CM | POA: Diagnosis not present

## 2020-11-27 DIAGNOSIS — E8881 Metabolic syndrome: Secondary | ICD-10-CM

## 2020-11-27 MED ORDER — PHENTERMINE HCL 15 MG PO CAPS: 15.0000 mg | ORAL_CAPSULE | Freq: Every day | ORAL | 0 refills | Status: DC

## 2020-11-27 MED ORDER — VITAMIN D (ERGOCALCIFEROL) 1.25 MG (50000 UNIT) PO CAPS: 50000.0000 [IU] | ORAL_CAPSULE | ORAL | 0 refills | Status: DC

## 2020-12-01 ENCOUNTER — Encounter (INDEPENDENT_AMBULATORY_CARE_PROVIDER_SITE_OTHER): Payer: Self-pay | Admitting: Bariatrics

## 2020-12-01 MED ORDER — PHENTERMINE HCL 15 MG PO CAPS: 15.0000 mg | ORAL_CAPSULE | Freq: Every day | ORAL | 0 refills | Status: DC

## 2020-12-01 NOTE — Progress Notes (Signed)
Chief Complaint:   OBESITY Sherri Cobb is here to discuss her progress with her obesity treatment plan along with follow-up of her obesity related diagnoses. Sherri Cobb is on the [employer] and states she is following her eating plan approximately 50% of the time. Sherri Cobb states she is doing 0 minutes 0 times per week.  Today's visit was #: 3 Starting weight: 188 lbs Starting date: 11/13/2020 Today's weight: 190 lbs Today's date: 11/27/2020 Total lbs lost to date: 0 Total lbs lost since last in-office visit: 0  Interim History: Sherri Cobb is up 2 lbs since her last visit. She has been out of town for some of the time. She is cutting back on the oil and some of the starches. Sherri Cobb agrees to start Phentermine 15 mg daily for 1 month with no refills.  Subjective:   1. Vitamin D insufficiency Sherri Cobb is currently taking multi-vitamins with minerals. Her last Vitamin D level was 30.9.   2. Insulin resistance Sherri Cobb's Insulin goal is 10 ounces or less.  Assessment/Plan:   1. Vitamin D insufficiency Low Vitamin D level contributes to fatigue and are associated with obesity, breast, and colon cancer. We will refill prescription Vitamin D 50,000 IU every week for 1 month with no refills and Sherri Cobb will follow-up for routine testing of Vitamin D, at least 2-3 times per year to avoid over-replacement.  - Vitamin D, Ergocalciferol, (DRISDOL) 1.25 MG (50000 UNIT) CAPS capsule; Take 1 capsule (50,000 Units total) by mouth every 7 (seven) days.  Dispense: 4 capsule; Refill: 0  2. Insulin resistance Sherri Cobb will continue decreasing simple carbohydrates (starchy and sweets) to help decrease the risk of diabetes. She will be active and increase exercise. Handout on Insulin resistance and pre-diabetes were provided today. She agreed to follow-up with [place] as directed to closely monitor her progress.   3. Obesity current BMI 30.8 Sherri Cobb is currently in the action stage of change. As such, her  goal is to continue with weight loss efforts. She has agreed to keeping a food journal and adhering to recommended goals of 1200 calories and 80 grams of protein and the Pescatarian Plan.   Sherri Cobb will continue meal planning and she will adhere closely to the plan. We reviewed labs from 11/13/2020 CMP, Lipid panel, Vitamin D, A 1 C, Insulin and thyroid panel. She will increase protein and keep her water intake up.  - phentermine 15 MG capsule; Take 1 capsule (15 mg total) by mouth daily with lunch.  Dispense: 30 capsule; Refill: 0  Exercise goals: No exercise has been prescribed at this time.  Behavioral modification strategies: increasing lean protein intake, decreasing simple carbohydrates, increasing vegetables, increasing water intake, decreasing eating out, no skipping meals, meal planning and cooking strategies, keeping healthy foods in the home, and planning for success.  Sherri Cobb has agreed to follow-up with our clinic in 2 weeks with myself or Adah Salvage, FNP or William Hamburger, NP and 5 weeks with myself. She was informed of the importance of frequent follow-up visits to maximize her success with intensive lifestyle modifications for her multiple health conditions.   Objective:   Blood pressure 134/83, pulse 85, temperature 97.9 F (36.6 C), height 5\' 6"  (1.676 m), weight 190 lb (86.2 kg), last menstrual period 11/11/2020, SpO2 96 %. Body mass index is 30.67 kg/m.  General: Cooperative, alert, well developed, in no acute distress. HEENT: Conjunctivae and lids unremarkable. Cardiovascular: Regular rhythm.  Lungs: Normal work of breathing. Neurologic: No focal deficits.   Lab Results  Component Value Date   CREATININE 0.94 11/13/2020   BUN 14 11/13/2020   NA 138 11/13/2020   K 4.8 11/13/2020   CL 103 11/13/2020   CO2 20 11/13/2020   Lab Results  Component Value Date   ALT 11 11/13/2020   AST 15 11/13/2020   ALKPHOS 49 11/13/2020   BILITOT 0.2 11/13/2020   Lab Results   Component Value Date   HGBA1C 5.1 11/13/2020   Lab Results  Component Value Date   INSULIN 13.2 11/13/2020   Lab Results  Component Value Date   TSH 2.150 11/13/2020   Lab Results  Component Value Date   CHOL 196 11/13/2020   HDL 50 11/13/2020   LDLCALC 129 (H) 11/13/2020   TRIG 96 11/13/2020   Lab Results  Component Value Date   VD25OH 30.9 11/13/2020   Lab Results  Component Value Date   WBC 8.3 07/12/2020   HGB 13.2 07/12/2020   HCT 39.5 07/12/2020   MCV 88.2 07/12/2020   PLT 279 07/12/2020   No results found for: IRON, TIBC, FERRITIN  Attestation Statements:   Reviewed by clinician on day of visit: allergies, medications, problem list, medical history, surgical history, family history, social history, and previous encounter notes.  I, Jackson Latino, RMA, am acting as Energy manager for [employer], DO.   I have reviewed the above documentation for accuracy and completeness, and I agree with the above. Corinna Capra, DO

## 2020-12-19 ENCOUNTER — Ambulatory Visit (INDEPENDENT_AMBULATORY_CARE_PROVIDER_SITE_OTHER): Payer: 59 | Admitting: Family Medicine

## 2020-12-19 ENCOUNTER — Encounter: Payer: Self-pay | Admitting: Family Medicine

## 2020-12-19 ENCOUNTER — Other Ambulatory Visit: Payer: Self-pay

## 2020-12-19 VITALS — BP 98/60 | HR 96 | Temp 98.4°F | Ht 66.0 in | Wt 191.0 lb

## 2020-12-19 DIAGNOSIS — Z0001 Encounter for general adult medical examination with abnormal findings: Secondary | ICD-10-CM | POA: Diagnosis not present

## 2020-12-19 DIAGNOSIS — R5383 Other fatigue: Secondary | ICD-10-CM | POA: Diagnosis not present

## 2020-12-19 DIAGNOSIS — M2550 Pain in unspecified joint: Secondary | ICD-10-CM

## 2020-12-19 DIAGNOSIS — R21 Rash and other nonspecific skin eruption: Secondary | ICD-10-CM | POA: Diagnosis not present

## 2020-12-19 DIAGNOSIS — E6609 Other obesity due to excess calories: Secondary | ICD-10-CM

## 2020-12-19 DIAGNOSIS — Z1159 Encounter for screening for other viral diseases: Secondary | ICD-10-CM

## 2020-12-19 DIAGNOSIS — Z6831 Body mass index (BMI) 31.0-31.9, adult: Secondary | ICD-10-CM

## 2020-12-19 DIAGNOSIS — Z5320 Procedure and treatment not carried out because of patient's decision for unspecified reasons: Secondary | ICD-10-CM

## 2020-12-19 LAB — VITAMIN D 25 HYDROXY (VIT D DEFICIENCY, FRACTURES): VITD: 27.12 ng/mL — ABNORMAL LOW (ref 30.00–100.00)

## 2020-12-19 LAB — VITAMIN B12: Vitamin B-12: 231 pg/mL (ref 211–911)

## 2020-12-19 LAB — SEDIMENTATION RATE: Sed Rate: 27 mm/hr — ABNORMAL HIGH (ref 0–20)

## 2020-12-19 MED ORDER — FLUOCINONIDE 0.05 % EX OINT: 1.0000 "application " | TOPICAL_OINTMENT | Freq: Two times a day (BID) | CUTANEOUS | 5 refills | Status: DC

## 2020-12-19 NOTE — Progress Notes (Signed)
This visit occurred during the SARS-CoV-2 public health emergency.  Safety protocols were in place, including screening questions prior to the visit, additional usage of staff PPE, and extensive cleaning of exam room while observing appropriate contact time as indicated for disinfecting solutions.    Patient ID: Sherri Cobb, female  DOB: 04-Dec-1980, 40 y.o.   MRN: [id number] Patient Care Team    Relationship Specialty Notifications Start End  Ma Hillock, DO PCP - General Family Medicine  11/14/20   Georgia Lopes, DO Consulting Physician Bariatrics  11/14/20   Hiram Gash, MD Consulting Physician Orthopedic Surgery  11/14/20   [place] [facility] Nurse Practitioner Gynecology  11/14/20    Comment: Joycelyn Rua, NP    Chief Complaint  Patient presents with   Annual Exam    Pt is fasting;     Subjective: Sherri Cobb is a 40 y.o.  Female  present for CPE. All past medical history, surgical history, allergies, family history, immunizations, medications and social history were updated in the electronic medical record today. All recent labs, ED visits and hospitalizations within the last year were reviewed.  Health maintenance:  Colonoscopy:  no fhx. Routine screen at 9 Mammogram: no fhx, routine screen 42 Cervical cancer screening: last pap: 07/17/2020 Kenton Kingfisher, NP - gyn Immunizations: tdap utd 2016, Influenza utd 10/2020 (encouraged yearly), covid utd Infectious disease screening: HIV and Hep C collected today DEXA: routine screen 60-65 Assistive device: none Oxygen XNT:ZGYF Patient has a Dental home. Hospitalizations/ED visits: reviewed  Rash:  She reports that her rash on her left anterior thigh completely resolved with the steroid creams applied.  However soon as she quit using it it quickly returned.  She also has a small area on her right lower shin that she feels is the same type of rash. Pt reports she has had an area on her left thigh that was red, raised,  dry scaly and itchy. She has been using a cream she had for ?yeast of the skin fungal/steroid combo. She states it has helped some and it the area is not as red, but is still present.  Fhx: psoriasis in her mother.    Frequent infections/fatigue She endorses continued increased fatigue over the last 3-6 months.  She endorses joint pain, back pain, new onset rash as well.  CBC, CMP and thyroid levels have been tested and are normal.  There is a family history of psoriasis.  She would like to complete lab work today to further pursue cause. Prior note: Pt reports she has had frequent uti and yeast infections. She also had a skin infection after surgery and an uterine infection. She had recent cbc, cmp, tsh, lipids and a1c at her bariatric doctors which were normal.     Depression screen [facility] 2/9 12/19/2020 11/13/2020 09/15/2017  Decreased Interest 0 2 0  Down, Depressed, Hopeless 0 1 0  PHQ - 2 Score 0 3 0  Altered sleeping - 2 0  Tired, decreased energy - 3 3  Change in appetite - 1 0  Feeling bad or failure about yourself  - 2 0  Trouble concentrating - 1 0  Moving slowly or fidgety/restless - 0 0  Suicidal thoughts - 0 0  PHQ-9 Score - 12 3  Difficult doing work/chores - Somewhat difficult -   No flowsheet data found.   Immunization History  Administered Date(s) Administered   Influenza-Unspecified 12/10/2017, 09/14/2018, 10/11/2020   PFIZER(Purple Top)SARS-COV-2 Vaccination 03/23/2019, 04/16/2019, 12/15/2019  Pension scheme manager 10yr & up 11/02/2020   Tdap 01/11/2014   Past Medical History:  Diagnosis Date   Allergies    Allergy    Back pain    Chicken pox    Fatty liver    Gallbladder problem    Hx of vaginitis 12/10/2017   Recurrent yeast with Mirena IUD Reported history of uterine infection, but unable to find that in her Care Everywhere chart.   Lactose intolerance    Menometrorrhagia    Before BCPs   Migraines    Psoriasis    No Known  Allergies Past Surgical History:  Procedure Laterality Date   CHOLECYSTECTOMY N/A 07/12/2020   Procedure: LAPAROSCOPIC CHOLECYSTECTOMY;  Surgeon: WIleana Roup MD;  Location: [facility];  Service: General;  Laterality: N/A;   KNEE ARTHROSCOPY WITH LATERAL RELEASE Right 12/27/2019   Procedure: KNEE ARTHROSCOPY WITH LATERAL RELEASE; REMOVAL LOOSE FOREIGN BODY; DEBRIDEMENT/SHAVING CHONDROPLASTY;  Surgeon: VHiram Gash MD;  Location: [facility]  Service: Orthopedics;  Laterality: Right;   LASIK Bilateral 2016   TIBIAL TUBERCLERPLASTY Right 12/27/2019   Procedure: LIGAMENT RECONSTRUCTION KNEE EXTRA-ARTICULAR, ANTERIOR TIBIAL TUBERCLEPLASTY, DECOMPRESSION FASCIOTOMY, LEG ANTERIOR;  Surgeon: VHiram Gash MD;  Location: [facility]  Service: Orthopedics;  Laterality: Right;   WISDOM TOOTH EXTRACTION     Family History  Problem Relation Age of Onset   Hypertension Mother    Hyperlipidemia Mother    Anxiety disorder Mother    Sleep apnea Mother    Thyroid disease Father    Depression Father    Gout Father    Kidney Stones Father    Miscarriages / Stillbirths Sister    Anxiety disorder Daughter    Depression Daughter    Hypertension Maternal Grandmother    Depression Maternal Grandmother    Skin cancer Maternal Grandmother    Diverticulitis Maternal Grandmother    Social History   Social History Narrative   Marital status/children/pets: Single/Div., 2 daughters   Education/employment: associates degree, administrative work   [employer]      -smoke alarm in the home:Yes     - wears seatbelt: Yes     - Feels safe in their relationships: Yes       Allergies as of 12/19/2020   No Known Allergies      Medication List        Accurate as of December 19, 2020 10:54 AM. If you have any questions, ask your nurse or doctor.          fluocinonide ointment 0.05 % Commonly known as: LIDEX Apply 1 application topically 2 (two) times daily.    magnesium 30 MG tablet Take 30 mg by mouth 2 (two) times daily.   multivitamin with minerals Tabs tablet Take 1 tablet by mouth daily.   phentermine 15 MG capsule Take 1 capsule (15 mg total) by mouth daily with lunch.   Turmeric 400 MG Caps Take by mouth.   Vitamin D (Ergocalciferol) 1.25 MG (50000 UNIT) Caps capsule Commonly known as: DRISDOL Take 1 capsule (50,000 Units total) by mouth every 7 (seven) days.        All past medical history, surgical history, allergies, family history, immunizations andmedications were updated in the EMR today and reviewed under the history and medication portions of their EMR.      CT ABDOMEN PELVIS WO CONTRAST Result Date: 07/12/2020  IMPRESSION: 1. Cholelithiasis with mild stranding around the gallbladder possibly indicating cholecystitis. 2. No evidence of bowel  obstruction or inflammation. Electronically Signed   By: Lucienne Capers M.D.   On: 07/12/2020 03:51    ROS 14 pt review of systems performed and negative (unless mentioned in an HPI)  Objective:  BP 98/60   Pulse 96   Temp 98.4 F (36.9 C) (Oral)   Ht 5' 6"  (1.676 m)   Wt 191 lb (86.6 kg)   SpO2 97%   BMI 30.83 kg/m   Physical Exam Constitutional:      General: She is not in acute distress.    Appearance: Normal appearance. She is obese. She is not ill-appearing or toxic-appearing.  HENT:     Head: Normocephalic and atraumatic.     Right Ear: Tympanic membrane, ear canal and external ear normal. There is no impacted cerumen.     Left Ear: Tympanic membrane, ear canal and external ear normal. There is no impacted cerumen.     Nose: No congestion or rhinorrhea.     Mouth/Throat:     Mouth: Mucous membranes are moist.     Pharynx: Oropharynx is clear. No oropharyngeal exudate or posterior oropharyngeal erythema.  Eyes:     General: No scleral icterus.       Right eye: No discharge.        Left eye: No discharge.     Extraocular Movements: Extraocular movements  intact.     Conjunctiva/sclera: Conjunctivae normal.     Pupils: Pupils are equal, round, and reactive to light.  Cardiovascular:     Rate and Rhythm: Normal rate and regular rhythm.     Pulses: Normal pulses.     Heart sounds: Normal heart sounds. No murmur heard.   No friction rub. No gallop.  Pulmonary:     Effort: Pulmonary effort is normal. No respiratory distress.     Breath sounds: Normal breath sounds. No stridor. No wheezing, rhonchi or rales.  Chest:     Chest wall: No tenderness.  Abdominal:     General: Abdomen is flat. Bowel sounds are normal. There is no distension.     Palpations: Abdomen is soft. There is no mass.     Tenderness: There is no abdominal tenderness. There is no right CVA tenderness, left CVA tenderness, guarding or rebound.     Hernia: No hernia is present.  Musculoskeletal:        General: No swelling, tenderness or deformity. Normal range of motion.     Cervical back: Normal range of motion and neck supple. No rigidity or tenderness.     Right lower leg: No edema.     Left lower leg: No edema.  Lymphadenopathy:     Cervical: No cervical adenopathy.  Skin:    General: Skin is warm and dry.     Coloration: Skin is not jaundiced or pale.     Findings: Rash (left ant thigh rash improved, new right lower leg pea sized patch) present. No bruising, erythema or lesion.  Neurological:     General: No focal deficit present.     Mental Status: She is alert and oriented to person, place, and time. Mental status is at baseline.     Cranial Nerves: No cranial nerve deficit.     Sensory: No sensory deficit.     Motor: No weakness.     Coordination: Coordination normal.     Gait: Gait normal.     Deep Tendon Reflexes: Reflexes normal.  Psychiatric:        Mood and Affect: Mood normal.  Behavior: Behavior normal.        Thought Content: Thought content normal.        Judgment: Judgment normal.      No results found.  Assessment/plan: ORAL HALLGREN is a 40 y.o. female present for CPE  Class 1 obesity due to excess calories with serious comorbidity and body mass index (BMI) of 31.0 to 31.9 in adult Following w/ healthy weight and wellness.   Need for hepatitis C screening test - Hepatitis C antibody HIV screening declined - HIV antibody (with reflex)  Fatigue/Arthralgia, unspecified joint/skin rash Patient has had a recent history of fatigue for the past couple months.  She has low back pain and joint discomfort.  New onset rash on her thigh consistent with psoriasis.  Elected to move forward with autoimmune work-up, vitamin deficiencies and inflammatory arthritis work-up to further investigate cause.  Her initial CBC, CMP, TSH were all normal. - VITAMIN D 25 Hydroxy (Vit-D Deficiency, Fractures) - B12 - ANA, IFA Comprehensive Panel-([facility]) - Rheumatoid Factor - Cyclic citrul peptide antibody, IgG ([facility]) - HLA-B27 antigen - Sedimentation rate - Iron, TIBC and Ferritin Panel  Encounter for general adult medical examination with abnormal findings Colonoscopy:  no fhx. Routine screen at 68 Mammogram: no fhx, routine screen 42 Cervical cancer screening: last pap: 07/17/2020 Kenton Kingfisher, NP - gyn Immunizations: tdap utd 2016, Influenza utd 10/2020 (encouraged yearly), covid utd Infectious disease screening: HIV and Hep C collected today DEXA: routine screen 60-65 Patient was encouraged to exercise greater than 150 minutes a week. Patient was encouraged to choose a diet filled with fresh fruits and vegetables, and lean meats. AVS provided to patient today for education/recommendation on gender specific health and safety maintenance.  Return in about 1 year (around 12/20/2021) for CPE (30 min).   Orders Placed This Encounter  Procedures   VITAMIN D 25 Hydroxy (Vit-D Deficiency, Fractures)   Hepatitis C antibody   B12   HIV antibody (with reflex)   ANA, IFA Comprehensive Panel-([facility])   Rheumatoid Factor   Cyclic citrul peptide  antibody, IgG ([facility])   HLA-B27 antigen   Sedimentation rate   Iron, TIBC and Ferritin Panel    Meds ordered this encounter  Medications   fluocinonide ointment (LIDEX) 0.05 %    Sig: Apply 1 application topically 2 (two) times daily.    Dispense:  30 g    Refill:  5    Referral Orders  No referral(s) requested today     Electronically signed by: Howard Pouch, [place]

## 2020-12-19 NOTE — Patient Instructions (Addendum)
Great to see you today.  I have refilled the medication(s) we provide.   If labs were collected, we will inform you of lab results once received either by echart message or telephone call.   - echart message- for normal results that have been seen by the patient already.   - telephone call: abnormal results or if patient has not viewed results in their echart.    Health Maintenance, Female Adopting a healthy lifestyle and getting preventive care are important in promoting health and wellness. Ask your health care provider about: The right schedule for you to have regular tests and exams. Things you can do on your own to prevent diseases and keep yourself healthy. What should I know about diet, weight, and exercise? Eat a healthy diet  Eat a diet that includes plenty of vegetables, fruits, low-fat dairy products, and lean protein. Do not eat a lot of foods that are high in solid fats, added sugars, or sodium. Maintain a healthy weight Body mass index (BMI) is used to identify weight problems. It estimates body fat based on height and weight. Your health care provider can help determine your BMI and help you achieve or maintain a healthy weight. Get regular exercise Get regular exercise. This is one of the most important things you can do for your health. Most adults should: Exercise for at least 150 minutes each week. The exercise should increase your heart rate and make you sweat (moderate-intensity exercise). Do strengthening exercises at least twice a week. This is in addition to the moderate-intensity exercise. Spend less time sitting. Even light physical activity can be beneficial. Watch cholesterol and blood lipids Have your blood tested for lipids and cholesterol at 38 years of age, then have this test every 5 years. Have your cholesterol levels checked more often if: Your lipid or cholesterol levels are high. You are older than 38 years of age. You are at high risk for heart  disease. What should I know about cancer screening? Depending on your health history and family history, you may need to have cancer screening at various ages. This may include screening for: Breast cancer. Cervical cancer. Colorectal cancer. Skin cancer. Lung cancer. What should I know about heart disease, diabetes, and high blood pressure? Blood pressure and heart disease High blood pressure causes heart disease and increases the risk of stroke. This is more likely to develop in people who have high blood pressure readings or are overweight. Have your blood pressure checked: Every 3-5 years if you are 45-1 years of age. Every year if you are 94 years old or older. Diabetes Have regular diabetes screenings. This checks your fasting blood sugar level. Have the screening done: Once every three years after age 8 if you are at a normal weight and have a low risk for diabetes. More often and at a younger age if you are overweight or have a high risk for diabetes. What should I know about preventing infection? Hepatitis B If you have a higher risk for hepatitis B, you should be screened for this virus. Talk with your health care provider to find out if you are at risk for hepatitis B infection. Hepatitis C Testing is recommended for: Everyone born from [birth year] through 1963. Anyone with known risk factors for hepatitis C. Sexually transmitted infections (STIs) Get screened for STIs, including gonorrhea and chlamydia, if: You are sexually active and are younger than 38 years of age. You are older than 38 years of age and your health  care provider tells you that you are at risk for this type of infection. Your sexual activity has changed since you were last screened, and you are at increased risk for chlamydia or gonorrhea. Ask your health care provider if you are at risk. Ask your health care provider about whether you are at high risk for HIV. Your health care provider may recommend a  prescription medicine to help prevent HIV infection. If you choose to take medicine to prevent HIV, you should first get tested for HIV. You should then be tested every 3 months for as long as you are taking the medicine. Pregnancy If you are about to stop having your period (premenopausal) and you may become pregnant, seek counseling before you get pregnant. Take 400 to 800 micrograms (mcg) of folic acid every day if you become pregnant. Ask for birth control (contraception) if you want to prevent pregnancy. Osteoporosis and menopause Osteoporosis is a disease in which the bones lose minerals and strength with aging. This can result in bone fractures. If you are 28 years old or older, or if you are at risk for osteoporosis and fractures, ask your health care provider if you should: Be screened for bone loss. Take a calcium or vitamin D supplement to lower your risk of fractures. Be given hormone replacement therapy (HRT) to treat symptoms of menopause. Follow these instructions at home: Alcohol use Do not drink alcohol if: Your health care provider tells you not to drink. You are pregnant, may be pregnant, or are planning to become pregnant. If you drink alcohol: Limit how much you have to: 0-1 drink a day. Know how much alcohol is in your drink. In the [place] one drink equals one 12 oz bottle of beer (355 mL), one 5 oz glass of wine (148 mL), or one 1 oz glass of hard liquor (44 mL). Lifestyle Do not use any products that contain nicotine or tobacco. These products include cigarettes, chewing tobacco, and vaping devices, such as e-cigarettes. If you need help quitting, ask your health care provider. Do not use street drugs. Do not share needles. Ask your health care provider for help if you need support or information about quitting drugs. General instructions Schedule regular health, dental, and eye exams. Stay current with your vaccines. Tell your health care provider if: You often  feel depressed. You have ever been abused or do not feel safe at home. Summary Adopting a healthy lifestyle and getting preventive care are important in promoting health and wellness. Follow your health care provider's instructions about healthy diet, exercising, and getting tested or screened for diseases. Follow your health care provider's instructions on monitoring your cholesterol and blood pressure. This information is not intended to replace advice given to you by your health care provider. Make sure you discuss any questions you have with your health care provider. Document Revised: 05/19/2020 Document Reviewed: 05/19/2020 Elsevier Patient Education  2022 [employer]   Health Maintenance, Female Adopting a healthy lifestyle and getting preventive care are important in promoting health and wellness. Ask your health care provider about: The right schedule for you to have regular tests and exams. Things you can do on your own to prevent diseases and keep yourself healthy. What should I know about diet, weight, and exercise? Eat a healthy diet  Eat a diet that includes plenty of vegetables, fruits, low-fat dairy products, and lean protein. Do not eat a lot of foods that are high in solid fats, added sugars, or sodium. Maintain  a healthy weight Body mass index (BMI) is used to identify weight problems. It estimates body fat based on height and weight. Your health care provider can help determine your BMI and help you achieve or maintain a healthy weight. Get regular exercise Get regular exercise. This is one of the most important things you can do for your health. Most adults should: Exercise for at least 150 minutes each week. The exercise should increase your heart rate and make you sweat (moderate-intensity exercise). Do strengthening exercises at least twice a week. This is in addition to the moderate-intensity exercise. Spend less time sitting. Even light physical activity can be  beneficial. Watch cholesterol and blood lipids Have your blood tested for lipids and cholesterol at 38 years of age, then have this test every 5 years. Have your cholesterol levels checked more often if: Your lipid or cholesterol levels are high. You are older than 38 years of age. You are at high risk for heart disease. What should I know about cancer screening? Depending on your health history and family history, you may need to have cancer screening at various ages. This may include screening for: Breast cancer. Cervical cancer. Colorectal cancer. Skin cancer. Lung cancer. What should I know about heart disease, diabetes, and high blood pressure? Blood pressure and heart disease High blood pressure causes heart disease and increases the risk of stroke. This is more likely to develop in people who have high blood pressure readings or are overweight. Have your blood pressure checked: Every 3-5 years if you are 45-75 years of age. Every year if you are 61 years old or older. Diabetes Have regular diabetes screenings. This checks your fasting blood sugar level. Have the screening done: Once every three years after age 6 if you are at a normal weight and have a low risk for diabetes. More often and at a younger age if you are overweight or have a high risk for diabetes. What should I know about preventing infection? Hepatitis B If you have a higher risk for hepatitis B, you should be screened for this virus. Talk with your health care provider to find out if you are at risk for hepatitis B infection. Hepatitis C Testing is recommended for: Everyone born from [birth year] through 1963. Anyone with known risk factors for hepatitis C. Sexually transmitted infections (STIs) Get screened for STIs, including gonorrhea and chlamydia, if: You are sexually active and are younger than 38 years of age. You are older than 38 years of age and your health care provider tells you that you are at risk for  this type of infection. Your sexual activity has changed since you were last screened, and you are at increased risk for chlamydia or gonorrhea. Ask your health care provider if you are at risk. Ask your health care provider about whether you are at high risk for HIV. Your health care provider may recommend a prescription medicine to help prevent HIV infection. If you choose to take medicine to prevent HIV, you should first get tested for HIV. You should then be tested every 3 months for as long as you are taking the medicine. Pregnancy If you are about to stop having your period (premenopausal) and you may become pregnant, seek counseling before you get pregnant. Take 400 to 800 micrograms (mcg) of folic acid every day if you become pregnant. Ask for birth control (contraception) if you want to prevent pregnancy. Osteoporosis and menopause Osteoporosis is a disease in which the bones lose minerals  and strength with aging. This can result in bone fractures. If you are 91 years old or older, or if you are at risk for osteoporosis and fractures, ask your health care provider if you should: Be screened for bone loss. Take a calcium or vitamin D supplement to lower your risk of fractures. Be given hormone replacement therapy (HRT) to treat symptoms of menopause. Follow these instructions at home: Alcohol use Do not drink alcohol if: Your health care provider tells you not to drink. You are pregnant, may be pregnant, or are planning to become pregnant. If you drink alcohol: Limit how much you have to: 0-1 drink a day. Know how much alcohol is in your drink. In the [place] one drink equals one 12 oz bottle of beer (355 mL), one 5 oz glass of wine (148 mL), or one 1 oz glass of hard liquor (44 mL). Lifestyle Do not use any products that contain nicotine or tobacco. These products include cigarettes, chewing tobacco, and vaping devices, such as e-cigarettes. If you need help quitting, ask your health  care provider. Do not use street drugs. Do not share needles. Ask your health care provider for help if you need support or information about quitting drugs. General instructions Schedule regular health, dental, and eye exams. Stay current with your vaccines. Tell your health care provider if: You often feel depressed. You have ever been abused or do not feel safe at home. Summary Adopting a healthy lifestyle and getting preventive care are important in promoting health and wellness. Follow your health care provider's instructions about healthy diet, exercising, and getting tested or screened for diseases. Follow your health care provider's instructions on monitoring your cholesterol and blood pressure. This information is not intended to replace advice given to you by your health care provider. Make sure you discuss any questions you have with your health care provider. Document Revised: 05/19/2020 Document Reviewed: 05/19/2020 Elsevier Patient Education  2022 [employer].

## 2020-12-22 ENCOUNTER — Telehealth: Payer: Self-pay | Admitting: Family Medicine

## 2020-12-22 DIAGNOSIS — E559 Vitamin D deficiency, unspecified: Secondary | ICD-10-CM | POA: Insufficient documentation

## 2020-12-22 DIAGNOSIS — E611 Iron deficiency: Secondary | ICD-10-CM | POA: Insufficient documentation

## 2020-12-22 DIAGNOSIS — E538 Deficiency of other specified B group vitamins: Secondary | ICD-10-CM | POA: Insufficient documentation

## 2020-12-22 NOTE — Telephone Encounter (Signed)
Please inform patient the following information: If her labs are still pending I will take a few days to return.  I did want to update her on current results as there are some abnormals. Normal liver and kidney functions. Normal electrolytes Cholesterol panel looks amazing and is at goal for her.  Iron levels are just mildly lower than normal, she would benefit starting a low-dose iron supplement such as ferrous sulfate 325 mg once every other day. Vitamin D levels also just mildly lower than normal at 27, normal is over 30 and goal usually is between 40-50 for adequate bone health.  Would encourage her to add 800 units of vitamin D daily to her regimen. Her B12 is extremely low at 231.  This suggests that she does not absorb B12 well.  I would encourage her to start B12 1000 mcg daily of sublingual B12.  This comes and tabs to place under the tongue or solution that is held under the tongue for 30 seconds.  Her inflammatory marker is just mildly elevated, this could mean an inflammatory condition, but it is not very high. The rheumatoid factor is negative.  The rest of the test are pending.  We will call her with those results once received.

## 2020-12-22 NOTE — Telephone Encounter (Signed)
Spoke with pt regarding labs and instructions.   

## 2020-12-22 NOTE — Telephone Encounter (Signed)
Mychart message sent. Pt states she has been around flu a lot lately.

## 2020-12-23 ENCOUNTER — Other Ambulatory Visit (INDEPENDENT_AMBULATORY_CARE_PROVIDER_SITE_OTHER): Payer: Self-pay

## 2020-12-23 ENCOUNTER — Encounter (INDEPENDENT_AMBULATORY_CARE_PROVIDER_SITE_OTHER): Payer: Self-pay | Admitting: Family Medicine

## 2020-12-23 ENCOUNTER — Other Ambulatory Visit: Payer: Self-pay

## 2020-12-23 ENCOUNTER — Ambulatory Visit (INDEPENDENT_AMBULATORY_CARE_PROVIDER_SITE_OTHER): Payer: 59 | Admitting: Family Medicine

## 2020-12-23 VITALS — BP 102/68 | HR 80 | Temp 98.0°F | Ht 66.0 in | Wt 186.0 lb

## 2020-12-23 DIAGNOSIS — R632 Polyphagia: Secondary | ICD-10-CM

## 2020-12-23 DIAGNOSIS — Z6830 Body mass index (BMI) 30.0-30.9, adult: Secondary | ICD-10-CM | POA: Diagnosis not present

## 2020-12-23 DIAGNOSIS — E669 Obesity, unspecified: Secondary | ICD-10-CM

## 2020-12-23 DIAGNOSIS — E559 Vitamin D deficiency, unspecified: Secondary | ICD-10-CM | POA: Diagnosis not present

## 2020-12-23 LAB — ANA, IFA COMPREHENSIVE PANEL
Anti Nuclear Antibody (ANA): POSITIVE — AB
ENA SM Ab Ser-aCnc: <1 AI
SM/RNP: <1 AI
SSA (Ro) (ENA) Antibody, IgG: <1 AI
SSB (La) (ENA) Antibody, IgG: <1 AI
Scleroderma (Scl-70) (ENA) Antibody, IgG: <1 AI
ds DNA Ab: 3 IU/mL

## 2020-12-23 LAB — HIV ANTIBODY (ROUTINE TESTING W REFLEX): HIV 1&2 Ab, 4th Generation: NONREACTIVE

## 2020-12-23 LAB — CYCLIC CITRUL PEPTIDE ANTIBODY, IGG: Cyclic Citrullin Peptide Ab: <16 UNITS

## 2020-12-23 LAB — IRON,TIBC AND FERRITIN PANEL
%SAT: 11 % (calc) — ABNORMAL LOW (ref 16–45)
Ferritin: 50 ng/mL (ref 16–154)
Iron: 34 ug/dL — ABNORMAL LOW (ref 40–190)
TIBC: 305 mcg/dL (calc) (ref 250–450)

## 2020-12-23 LAB — HEPATITIS C ANTIBODY
Hepatitis C Ab: NONREACTIVE
SIGNAL TO CUT-OFF: 0.05 (ref <1.00)

## 2020-12-23 LAB — RHEUMATOID FACTOR: Rheumatoid fact SerPl-aCnc: <14 IU/mL (ref <14)

## 2020-12-23 LAB — HLA-B27 ANTIGEN: HLA-B27 Antigen: POSITIVE — AB

## 2020-12-23 LAB — ANTI-NUCLEAR AB-TITER (ANA TITER): ANA Titer 1: 1:80 {titer} — ABNORMAL HIGH

## 2020-12-23 MED ORDER — VITAMIN D (ERGOCALCIFEROL) 1.25 MG (50000 UNIT) PO CAPS
50000.0000 [IU] | ORAL_CAPSULE | ORAL | 0 refills | Status: DC
Start: 2020-12-23 — End: 2021-01-19

## 2020-12-23 MED ORDER — PHENTERMINE HCL 15 MG PO CAPS: 15.0000 mg | ORAL_CAPSULE | Freq: Every day | ORAL | 0 refills | Status: DC

## 2020-12-24 ENCOUNTER — Telehealth: Payer: Self-pay | Admitting: Family Medicine

## 2020-12-24 ENCOUNTER — Encounter (INDEPENDENT_AMBULATORY_CARE_PROVIDER_SITE_OTHER): Payer: Self-pay | Admitting: Family Medicine

## 2020-12-24 DIAGNOSIS — Z1589 Genetic susceptibility to other disease: Secondary | ICD-10-CM

## 2020-12-24 DIAGNOSIS — R700 Elevated erythrocyte sedimentation rate: Secondary | ICD-10-CM

## 2020-12-24 DIAGNOSIS — M2550 Pain in unspecified joint: Secondary | ICD-10-CM

## 2020-12-24 DIAGNOSIS — R768 Other specified abnormal immunological findings in serum: Secondary | ICD-10-CM

## 2020-12-24 DIAGNOSIS — M545 Low back pain, unspecified: Secondary | ICD-10-CM

## 2020-12-24 NOTE — Progress Notes (Signed)
Chief Complaint:   OBESITY Sherri Cobb is here to discuss her progress with her obesity treatment plan along with follow-up of her obesity related diagnoses. Sherri Cobb is on keeping a food journal and adhering to recommended goals of 1200 calories and 80 grams of protein and the Pescatarian Plan and states she is following her eating plan approximately 50% of the time. Sherri Cobb states she is doing Yoga for 20 minutes 3 times per week.  Today's visit was #: 4 Starting weight: 188 lbs Starting date: 11/13/2020 Today's weight: 186 lbs Today's date: 12/23/2020 Total lbs lost to date: 2 lbs Total lbs lost since last in-office visit: 4 lbs  Interim History: Sherri Cobb notes she has trouble adhering to dietary regimen. However, the phentermine prescribed at last office visit has helped reduced snacking. She tries to eat breakfast but is not usually hungry until lunch. She has occasional soda and latte once weekly. 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   Subjective:   1. Vitamin D insufficiency Sherri Cobb's Vitamin D is low at 27.12. She notes fatigue. She is on weekly prescription Vitamin D.  Lab Results  Component Value Date   VD25OH 27.12 (L) 12/19/2020   VD25OH 30.9 11/13/2020    2. Polyphagia Sherri Cobb notes Phentermine is working well for appetite control. She denies insomnia and jitteriness. She notes some irritability.  BP Readings from Last 3 Encounters:  12/23/20 102/68  12/19/20 98/60  11/27/20 134/83     Assessment/Plan:   1. Vitamin D insufficiency  We will refill prescription Vitamin D 50,000 IU every week and Sherri Cobb will follow-up for routine testing of Vitamin D, at least 2-3 times per year to avoid over-replacement.  - Vitamin D, Ergocalciferol, (DRISDOL) 1.25 MG (50000 UNIT) CAPS capsule; Take 1 capsule (50,000 Units total) by mouth every 7 (seven) days.  Dispense: 4 capsule; Refill:  0  2. Polyphagia I will have Phentermine 15 mg refilled by Dr. Earlene Plater.   3. Obesity current BMI 30.04 Sherri Cobb is currently in the action stage of change. As such, her goal is to continue with weight loss efforts. She has agreed to keeping a food journal and adhering to recommended goals of 1200 calories and 80 grams of protein daily.   Handouts: Protein content of food was provided today.  Exercise goals:  As is.  Behavioral modification strategies: increasing lean protein intake, decreasing simple carbohydrates, and keeping a strict food journal.  Sherri Cobb has agreed to follow-up with our clinic in 3 weeks.  Objective:   Blood pressure 102/68, pulse 80, temperature 98 F (36.7 C), height 5\' 6"  (1.676 m), weight 186 lb (84.4 kg), SpO2 97 %. Body mass index is 30.02 kg/m.  General: Cooperative, alert, well developed, in no acute distress. HEENT: Conjunctivae and lids unremarkable. Cardiovascular: Regular rhythm.  Lungs: Normal work of breathing. Neurologic: No focal deficits.   Lab Results  Component Value Date   CREATININE 0.94 11/13/2020   BUN 14 11/13/2020   NA 138 11/13/2020   K 4.8 11/13/2020   CL 103 11/13/2020   CO2 20 11/13/2020   Lab Results  Component Value Date   ALT 11 11/13/2020   AST 15 11/13/2020   ALKPHOS 49 11/13/2020   BILITOT 0.2 11/13/2020   Lab Results  Component Value Date   HGBA1C 5.1 11/13/2020   Lab Results  Component Value Date   INSULIN 13.2 11/13/2020  Lab Results  Component Value Date   TSH 2.150 11/13/2020   Lab Results  Component Value Date   CHOL 196 11/13/2020   HDL 50 11/13/2020   LDLCALC 129 (H) 11/13/2020   TRIG 96 11/13/2020   Lab Results  Component Value Date   VD25OH 27.12 (L) 12/19/2020   VD25OH 30.9 11/13/2020   Lab Results  Component Value Date   WBC 8.3 07/12/2020   HGB 13.2 07/12/2020   HCT 39.5 07/12/2020   MCV 88.2 07/12/2020   PLT 279 07/12/2020   Lab Results  Component Value Date   IRON 34  (L) 12/19/2020   TIBC 305 12/19/2020   FERRITIN 50 12/19/2020   Attestation Statements:   Reviewed by clinician on day of visit: allergies, medications, problem list, medical history, surgical history, family history, social history, and previous encounter notes.  I, Jackson Latino, RMA, am acting as Energy manager for [place], FNP.   I have reviewed the above documentation for accuracy and completeness, and I agree with the above. -  Jesse Sans, FNP

## 2020-12-24 NOTE — Telephone Encounter (Signed)
Spoke with pt regarding labs and instructions.   

## 2020-12-24 NOTE — Telephone Encounter (Signed)
Please inform patient: °Her HLA-B27 returned positive.  This could be an indication of certain autoimmune disorders that cause back pain and arthralgias. °Her inflammatory marker is mildly elevated and her ANA which is the screening for some autoimmune disorders is also positive.  The ANA is very weakly positive and does not necessarily indicate much when it is only 1: 80 titer, but could contribute to the full story of her symptoms. ° °I have referred her to rheumatology for further evaluation.  Rheumatologist specialize in autoimmune disorders and bone disorders.  They will further investigate cause of her abnormal lab values and start further work-up for her. ° ° °The meantime, I had sent her a message about her vitamin D, iron and B12.  I would recommend she start those supplements as they are certainly contributing to potential fatigue. °

## 2021-01-19 ENCOUNTER — Ambulatory Visit (INDEPENDENT_AMBULATORY_CARE_PROVIDER_SITE_OTHER): Payer: 59 | Admitting: Bariatrics

## 2021-01-19 ENCOUNTER — Other Ambulatory Visit (INDEPENDENT_AMBULATORY_CARE_PROVIDER_SITE_OTHER): Payer: Self-pay | Admitting: Family Medicine

## 2021-01-19 ENCOUNTER — Encounter (INDEPENDENT_AMBULATORY_CARE_PROVIDER_SITE_OTHER): Payer: Self-pay | Admitting: Bariatrics

## 2021-01-19 ENCOUNTER — Other Ambulatory Visit: Payer: Self-pay

## 2021-01-19 VITALS — BP 115/80 | HR 87 | Temp 98.2°F | Ht 66.0 in | Wt 184.0 lb

## 2021-01-19 DIAGNOSIS — E559 Vitamin D deficiency, unspecified: Secondary | ICD-10-CM

## 2021-01-19 DIAGNOSIS — Z6829 Body mass index (BMI) 29.0-29.9, adult: Secondary | ICD-10-CM

## 2021-01-19 DIAGNOSIS — R632 Polyphagia: Secondary | ICD-10-CM

## 2021-01-19 DIAGNOSIS — E669 Obesity, unspecified: Secondary | ICD-10-CM | POA: Diagnosis not present

## 2021-01-19 DIAGNOSIS — Z6830 Body mass index (BMI) 30.0-30.9, adult: Secondary | ICD-10-CM

## 2021-01-19 MED ORDER — PHENTERMINE HCL 15 MG PO CAPS: 15.0000 mg | ORAL_CAPSULE | Freq: Every day | ORAL | 0 refills | Status: DC

## 2021-01-19 MED ORDER — VITAMIN D (ERGOCALCIFEROL) 1.25 MG (50000 UNIT) PO CAPS: 50000.0000 [IU] | ORAL_CAPSULE | ORAL | 0 refills | Status: DC

## 2021-01-19 NOTE — Progress Notes (Signed)
Chief Complaint:   OBESITY Sherri Cobb is here to discuss her progress with her obesity treatment plan along with follow-up of her obesity related diagnoses. Sherri Cobb is on keeping a food journal and adhering to recommended goals of 1400 calories and 80 grams of protein and states she is following her eating plan approximately 0% of the time. Sherri Cobb states she is doing yoga for 20-30 minutes 2-3 times per week.  Today's visit was #: 5 Starting weight: 188 lbs Starting date: 11/13/2020 Today's weight: 184 lbs Today's date: 01/19/2021 Total lbs lost to date: 4 lbs Total lbs lost since last in-office visit: 2 lbs  Interim History: Sherri Cobb is down 2 lbs since her last visit. She is eating 2 meals planning applications.   Subjective:   1. Vitamin D insufficiency Sherri Cobb's energy is inconsistent.   2. Polyphagia Sherri Cobb states phentermine helps with appetite.   Assessment/Plan:   1. Vitamin D insufficiency Low Vitamin D level contributes to fatigue and are associated with obesity, breast, and colon cancer. We will refill prescription Vitamin D 50,000 IU every week for 1 month with no refills and Sherri Cobb will follow-up for routine testing of Vitamin D, at least 2-3 times per year to avoid over-replacement.  - Vitamin D, Ergocalciferol, (DRISDOL) 1.25 MG (50000 UNIT) CAPS capsule; Take 1 capsule (50,000 Units total) by mouth every 7 (seven) days.  Dispense: 4 capsule; Refill: 0  2. Polyphagia Intensive lifestyle modifications are the first line treatment for this issue. Sherri Cobb will continue with phentermine. We discussed several lifestyle modifications today and she will continue to work on diet, exercise and weight loss efforts. Orders and follow up as documented in patient record.  Counseling Polyphagia is excessive hunger. Causes can include: low blood sugars, hypERthyroidism, PMS, lack of sleep, stress, insulin resistance, diabetes, certain medications, and diets that are deficient  in protein and fiber.    3. Obesity current BMI 29.8 Sherri Cobb is currently in the action stage of change. As such, her goal is to continue with weight loss efforts. She has agreed to keeping a food journal and adhering to recommended goals of 1400 calories and 80 grams of protein.   Sherri Cobb will resume adhering to the plan and she will continue meal planning. We will refill phentermine 15 mg for 1 month with no refills. She will keep portions small.   - phentermine 15 MG capsule; Take 1 capsule (15 mg total) by mouth daily with lunch.  Dispense: 30 capsule; Refill: 0  Exercise goals:  As is. Sherri Cobb will do some walking and outside activities.   Behavioral modification strategies: increasing lean protein intake, decreasing simple carbohydrates, increasing vegetables, increasing water intake, decreasing eating out, no skipping meals, meal planning and cooking strategies, keeping healthy foods in the home, and planning for success.  [name] has agreed to follow-up with our clinic in 2 weeks. She was informed of the importance of frequent follow-up visits to maximize her success with intensive lifestyle modifications for her multiple health conditions.   Objective:   Blood pressure 115/80, pulse 87, temperature 98.2 F (36.8 C), height 5\' 6"  (1.676 m), weight 184 lb (83.5 kg), SpO2 97 %. Body mass index is 29.7 kg/m.  General: Cooperative, alert, well developed, in no acute distress. HEENT: Conjunctivae and lids unremarkable. Cardiovascular: Regular rhythm.  Lungs: Normal work of breathing. Neurologic: No focal deficits.   Lab Results  Component Value Date   CREATININE 0.94 11/13/2020   BUN 14 11/13/2020   NA 138 11/13/2020  K 4.8 11/13/2020   CL 103 11/13/2020   CO2 20 11/13/2020   Lab Results  Component Value Date   ALT 11 11/13/2020   AST 15 11/13/2020   ALKPHOS 49 11/13/2020   BILITOT 0.2 11/13/2020   Lab Results  Component Value Date   HGBA1C 5.1 11/13/2020   Lab  Results  Component Value Date   INSULIN 13.2 11/13/2020   Lab Results  Component Value Date   TSH 2.150 11/13/2020   Lab Results  Component Value Date   CHOL 196 11/13/2020   HDL 50 11/13/2020   LDLCALC 129 (H) 11/13/2020   TRIG 96 11/13/2020   Lab Results  Component Value Date   VD25OH 27.12 (L) 12/19/2020   VD25OH 30.9 11/13/2020   Lab Results  Component Value Date   WBC 8.3 07/12/2020   HGB 13.2 07/12/2020   HCT 39.5 07/12/2020   MCV 88.2 07/12/2020   PLT 279 07/12/2020   Lab Results  Component Value Date   IRON 34 (L) 12/19/2020   TIBC 305 12/19/2020   FERRITIN 50 12/19/2020   Attestation Statements:   Reviewed by clinician on day of visit: allergies, medications, problem list, medical history, surgical history, family history, social history, and previous encounter notes.  I, Sherri Cobb, RMA, am acting as Energy manager for [employer], DO.  I have reviewed the above documentation for accuracy and completeness, and I agree with the above. Corinna Capra, DO

## 2021-01-20 ENCOUNTER — Encounter (INDEPENDENT_AMBULATORY_CARE_PROVIDER_SITE_OTHER): Payer: Self-pay | Admitting: Bariatrics

## 2021-01-27 ENCOUNTER — Encounter: Payer: Self-pay | Admitting: Family Medicine

## 2021-01-27 DIAGNOSIS — M545 Low back pain, unspecified: Secondary | ICD-10-CM

## 2021-01-27 DIAGNOSIS — R768 Other specified abnormal immunological findings in serum: Secondary | ICD-10-CM

## 2021-01-27 DIAGNOSIS — R700 Elevated erythrocyte sedimentation rate: Secondary | ICD-10-CM

## 2021-01-27 DIAGNOSIS — Z1589 Genetic susceptibility to other disease: Secondary | ICD-10-CM

## 2021-02-02 ENCOUNTER — Ambulatory Visit (INDEPENDENT_AMBULATORY_CARE_PROVIDER_SITE_OTHER): Payer: 59 | Admitting: Bariatrics

## 2021-02-02 ENCOUNTER — Other Ambulatory Visit: Payer: Self-pay

## 2021-02-02 ENCOUNTER — Encounter (INDEPENDENT_AMBULATORY_CARE_PROVIDER_SITE_OTHER): Payer: Self-pay | Admitting: Bariatrics

## 2021-02-02 VITALS — BP 99/72 | HR 78 | Temp 97.7°F | Ht 66.0 in | Wt 183.0 lb

## 2021-02-02 DIAGNOSIS — E669 Obesity, unspecified: Secondary | ICD-10-CM | POA: Diagnosis not present

## 2021-02-02 DIAGNOSIS — Z6829 Body mass index (BMI) 29.0-29.9, adult: Secondary | ICD-10-CM

## 2021-02-02 DIAGNOSIS — E559 Vitamin D deficiency, unspecified: Secondary | ICD-10-CM | POA: Diagnosis not present

## 2021-02-02 DIAGNOSIS — R632 Polyphagia: Secondary | ICD-10-CM

## 2021-02-02 DIAGNOSIS — Z6830 Body mass index (BMI) 30.0-30.9, adult: Secondary | ICD-10-CM

## 2021-02-02 MED ORDER — PHENTERMINE HCL 15 MG PO CAPS: 15.0000 mg | ORAL_CAPSULE | Freq: Every day | ORAL | 0 refills | Status: DC

## 2021-02-02 MED ORDER — VITAMIN D (ERGOCALCIFEROL) 1.25 MG (50000 UNIT) PO CAPS: 50000.0000 [IU] | ORAL_CAPSULE | ORAL | 0 refills | Status: DC

## 2021-02-02 NOTE — Progress Notes (Signed)
Chief Complaint:   OBESITY Sherri Cobb is here to discuss her progress with her obesity treatment plan along with follow-up of her obesity related diagnoses. Sherri Cobb is on keeping a food journal and adhering to recommended goals of 1200-1250 calories and 80 grams of protein and states she is following her eating plan approximately 80-85% of the time. Sherri Cobb states she is doing yoga and pilates for 30 minutes 3 times per week.  Today's visit was #: 6 Starting weight: 188 lbs Starting date: 11/13/2020 Today's weight: 183 lbs Today's date: 02/02/2021 Total lbs lost to date: 5 lbs Total lbs lost since last in-office visit: 1 lb  Interim History: Sherri Cobb is down 1 additional pound since her last visit. She is doing better with the plan.  Subjective:   1. Vitamin D insufficiency Sherri Cobb is taking Vitamin D currently.  2. Sherri Cobb currently phentermine currently.  Assessment/Plan:   1. Vitamin D insufficiency Low Vitamin D level contributes to fatigue and are associated with obesity, breast, and colon cancer. We will refill prescription Vitamin D 50,000 IU every week for 1 month with no refills and Gaynel will follow-up for routine testing of Vitamin D, at least 2-3 times per year to avoid over-replacement.  - Vitamin D, Ergocalciferol, (DRISDOL) 1.25 MG (50000 UNIT) CAPS capsule; Take 1 capsule (50,000 Units total) by mouth every 7 (seven) days.  Dispense: 4 capsule; Refill: 0  2. Polyphagia We will refill phentermine 15 mg by mouth daily with lunch for 1 month with no refills. Intensive lifestyle modifications are the first line treatment for this issue. We discussed several lifestyle modifications today and she will continue to work on diet, exercise and weight loss efforts. Orders and follow up as documented in patient record.  Counseling Polyphagia is excessive hunger. Causes can include: low blood sugars, hypERthyroidism, PMS, lack of sleep, stress, insulin resistance,  diabetes, certain medications, and diets that are deficient in protein and fiber.   - phentermine 15 MG capsule; Take 1 capsule (15 mg total) by mouth daily with lunch.  Dispense: 30 capsule; Refill: 0  3. Obesity current BMI 29.6 Sherri Cobb is currently in the action stage of change. As such, her goal is to continue with weight loss efforts. She has agreed to keeping a food journal and adhering to recommended goals of 1200-1250 calories and 80 grams of protein.   Sherri Cobb will adhere closely to the plan. She will keep a journal, calories and protein.   - phentermine 15 MG capsule; Take 1 capsule (15 mg total) by mouth daily with lunch.  Dispense: 30 capsule; Refill: 0  Exercise goals:  As is.  Behavioral modification strategies: increasing lean protein intake, decreasing simple carbohydrates, increasing vegetables, increasing water intake, decreasing eating out, no skipping meals, meal planning and cooking strategies, keeping healthy foods in the home, and planning for success.  Sherri Cobb has agreed to follow-up with our clinic in 2 weeks. She was informed of the importance of frequent follow-up visits to maximize her success with intensive lifestyle modifications for her multiple health conditions.   Objective:   Blood pressure 99/72, pulse 78, temperature 97.7 F (36.5 C), height 5\' 6"  (1.676 m), weight 183 lb (83 kg), SpO2 99 %. Body mass index is 29.54 kg/m.  General: Cooperative, alert, well developed, in no acute distress. HEENT: Conjunctivae and lids unremarkable. Cardiovascular: Regular rhythm.  Lungs: Normal work of breathing. Neurologic: No focal deficits.   Lab Results  Component Value Date   CREATININE 0.94 11/13/2020  BUN 14 11/13/2020   NA 138 11/13/2020   K 4.8 11/13/2020   CL 103 11/13/2020   CO2 20 11/13/2020   Lab Results  Component Value Date   ALT 11 11/13/2020   AST 15 11/13/2020   ALKPHOS 49 11/13/2020   BILITOT 0.2 11/13/2020   Lab Results  Component  Value Date   HGBA1C 5.1 11/13/2020   Lab Results  Component Value Date   INSULIN 13.2 11/13/2020   Lab Results  Component Value Date   TSH 2.150 11/13/2020   Lab Results  Component Value Date   CHOL 196 11/13/2020   HDL 50 11/13/2020   LDLCALC 129 (H) 11/13/2020   TRIG 96 11/13/2020   Lab Results  Component Value Date   VD25OH 27.12 (L) 12/19/2020   VD25OH 30.9 11/13/2020   Lab Results  Component Value Date   WBC 8.3 07/12/2020   HGB 13.2 07/12/2020   HCT 39.5 07/12/2020   MCV 88.2 07/12/2020   PLT 279 07/12/2020   Lab Results  Component Value Date   IRON 34 (L) 12/19/2020   TIBC 305 12/19/2020   FERRITIN 50 12/19/2020   Attestation Statements:   Reviewed by clinician on day of visit: allergies, medications, problem list, medical history, surgical history, family history, social history, and previous encounter notes.  I, Lizbeth Bark, RMA, am acting as Location manager for [employer], DO.  I have reviewed the above documentation for accuracy and completeness, and I agree with the above. Jearld Lesch, DO

## 2021-02-03 ENCOUNTER — Encounter (INDEPENDENT_AMBULATORY_CARE_PROVIDER_SITE_OTHER): Payer: Self-pay | Admitting: Bariatrics

## 2021-02-16 ENCOUNTER — Other Ambulatory Visit: Payer: Self-pay

## 2021-02-16 ENCOUNTER — Encounter (INDEPENDENT_AMBULATORY_CARE_PROVIDER_SITE_OTHER): Payer: Self-pay | Admitting: Bariatrics

## 2021-02-16 ENCOUNTER — Ambulatory Visit (INDEPENDENT_AMBULATORY_CARE_PROVIDER_SITE_OTHER): Payer: 59 | Admitting: Bariatrics

## 2021-02-16 VITALS — BP 118/82 | HR 90 | Temp 98.0°F | Ht 66.0 in | Wt 182.0 lb

## 2021-02-16 DIAGNOSIS — E559 Vitamin D deficiency, unspecified: Secondary | ICD-10-CM | POA: Diagnosis not present

## 2021-02-16 DIAGNOSIS — E669 Obesity, unspecified: Secondary | ICD-10-CM

## 2021-02-16 DIAGNOSIS — Z6829 Body mass index (BMI) 29.0-29.9, adult: Secondary | ICD-10-CM | POA: Diagnosis not present

## 2021-02-16 DIAGNOSIS — R632 Polyphagia: Secondary | ICD-10-CM

## 2021-02-16 MED ORDER — PHENTERMINE HCL 15 MG PO CAPS: 15.0000 mg | ORAL_CAPSULE | Freq: Every day | ORAL | 0 refills | Status: DC

## 2021-02-16 MED ORDER — WEGOVY 0.25 MG/0.5ML ~~LOC~~ SOAJ: 0.2500 mg | SUBCUTANEOUS | 0 refills | Status: DC

## 2021-02-16 NOTE — Progress Notes (Signed)
Chief Complaint:   OBESITY Sherri Cobb is here to discuss her progress with her obesity treatment plan along with follow-up of her obesity related diagnoses. Sherri Cobb is on keeping a food journal and adhering to recommended goals of 1250 calories and 80 grams of protein and states she is following her eating plan approximately 50% of the time. Sherri Cobb states she is doing Pilates and yoga for 30 minutes 2-3 times per week.  Today's visit was #: 7 Starting weight: 188 lbs Starting date: 11/13/2020 Today's weight: 182 lbs Today's date: 02/16/2021 Total lbs lost to date: 6 lbs Total lbs lost since last in-office visit: 1 lb  Interim History: Sherri Cobb is down 1 lb since her last visit. She is taking phentermine without side effects. She had several celebrations and parties.  Subjective:   1. Polyphagia Sherri Cobb is taking phentermine currently.   2. Vitamin D insufficiency Sherri Cobb is currently taking Vitamin D.  Assessment/Plan:   1. Polyphagia Intensive lifestyle modifications are the first line treatment for this issue.Sherri Cobb will continue taking phentermine. She will keep protein high.We will refill phentermine 15 mg for 1 month with no refills. We discussed several lifestyle modifications today and she will continue to work on diet, exercise and weight loss efforts. Orders and follow up as documented in patient record.  Counseling Polyphagia is excessive hunger. Causes can include: low blood sugars, hypERthyroidism, PMS, lack of sleep, stress, insulin resistance, diabetes, certain medications, and diets that are deficient in protein and fiber.    - phentermine 15 MG capsule; Take 1 capsule (15 mg total) by mouth daily with lunch.  Dispense: 30 capsule; Refill: 0  2. Vitamin D insufficiency Low Vitamin D level contributes to fatigue and are associated with obesity, breast, and colon cancer. Sherri Cobb will continue prescription Vitamin D 50,000 IU every week and she will follow-up for  routine testing of Vitamin D, at least 2-3 times per year to avoid over-replacement.  3. Obesity current BMI 29.4 Sherri Cobb is currently in the action stage of change. As such, her goal is to continue with weight loss efforts. She has agreed to keeping a food journal and adhering to recommended goals of 1250 calories and 80 grams of protein.  We will refill phentermine 15 mg for 1 month with no refills. We will refill Wegovy 0.25 mg for 1 month with no refills (needs a prior authorization).   - Semaglutide-Weight Management (WEGOVY) 0.25 MG/0.5ML SOAJ; Inject 0.25 mg into the skin once a week.  Dispense: 2 mL; Refill: 0  Exercise goals:  Sherri Cobb will go back to the gym.  Behavioral modification strategies: increasing lean protein intake, decreasing simple carbohydrates, increasing vegetables, increasing water intake, decreasing eating out, no skipping meals, meal planning and cooking strategies, keeping healthy foods in the home, and planning for success.  Sherri Cobb has agreed to follow-up with our clinic in 2-3 weeks. She was informed of the importance of frequent follow-up visits to maximize her success with intensive lifestyle modifications for her multiple health conditions.   Objective:   Blood pressure 118/82, pulse 90, temperature 98 F (36.7 C), height 5\' 6"  (1.676 m), weight 182 lb (82.6 kg), SpO2 97 %. Body mass index is 29.38 kg/m.  General: Cooperative, alert, well developed, in no acute distress. HEENT: Conjunctivae and lids unremarkable. Cardiovascular: Regular rhythm.  Lungs: Normal work of breathing. Neurologic: No focal deficits.   Lab Results  Component Value Date   CREATININE 0.94 11/13/2020   BUN 14 11/13/2020   NA 138  11/13/2020   K 4.8 11/13/2020   CL 103 11/13/2020   CO2 20 11/13/2020   Lab Results  Component Value Date   ALT 11 11/13/2020   AST 15 11/13/2020   ALKPHOS 49 11/13/2020   BILITOT 0.2 11/13/2020   Lab Results  Component Value Date   HGBA1C  5.1 11/13/2020   Lab Results  Component Value Date   INSULIN 13.2 11/13/2020   Lab Results  Component Value Date   TSH 2.150 11/13/2020   Lab Results  Component Value Date   CHOL 196 11/13/2020   HDL 50 11/13/2020   LDLCALC 129 (H) 11/13/2020   TRIG 96 11/13/2020   Lab Results  Component Value Date   VD25OH 27.12 (L) 12/19/2020   VD25OH 30.9 11/13/2020   Lab Results  Component Value Date   WBC 8.3 07/12/2020   HGB 13.2 07/12/2020   HCT 39.5 07/12/2020   MCV 88.2 07/12/2020   PLT 279 07/12/2020   Lab Results  Component Value Date   IRON 34 (L) 12/19/2020   TIBC 305 12/19/2020   FERRITIN 50 12/19/2020   Attestation Statements:   Reviewed by clinician on day of visit: allergies, medications, problem list, medical history, surgical history, family history, social history, and previous encounter notes.  I, Jackson Latino, RMA, am acting as Energy manager for [employer], DO.  I have reviewed the above documentation for accuracy and completeness, and I agree with the above. Corinna Capra, DO

## 2021-02-17 ENCOUNTER — Encounter (INDEPENDENT_AMBULATORY_CARE_PROVIDER_SITE_OTHER): Payer: Self-pay | Admitting: Bariatrics

## 2021-02-18 ENCOUNTER — Encounter (INDEPENDENT_AMBULATORY_CARE_PROVIDER_SITE_OTHER): Payer: Self-pay | Admitting: Bariatrics

## 2021-02-18 NOTE — Telephone Encounter (Signed)
Called pt who stated that she willing to try [facility] but mentioned that there was a discussion about an endocrinologist.

## 2021-02-19 NOTE — Telephone Encounter (Signed)
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  Rheumatology is what is needed to evaluate her positive lab findings and symptoms.  I am sorry her appointment to them is delayed-we will get the new rheumatology referral placed for her with the above diagnostic codes.  I have placed secondary referral to [facility] rheumatology for her.

## 2021-02-20 ENCOUNTER — Other Ambulatory Visit (INDEPENDENT_AMBULATORY_CARE_PROVIDER_SITE_OTHER): Payer: Self-pay | Admitting: Bariatrics

## 2021-02-20 DIAGNOSIS — E559 Vitamin D deficiency, unspecified: Secondary | ICD-10-CM

## 2021-02-24 ENCOUNTER — Encounter (INDEPENDENT_AMBULATORY_CARE_PROVIDER_SITE_OTHER): Payer: Self-pay

## 2021-03-02 ENCOUNTER — Ambulatory Visit: Payer: 59 | Admitting: Rheumatology

## 2021-03-04 ENCOUNTER — Ambulatory Visit (INDEPENDENT_AMBULATORY_CARE_PROVIDER_SITE_OTHER): Payer: 59 | Admitting: Bariatrics

## 2021-03-04 ENCOUNTER — Encounter (INDEPENDENT_AMBULATORY_CARE_PROVIDER_SITE_OTHER): Payer: Self-pay | Admitting: Bariatrics

## 2021-03-04 ENCOUNTER — Other Ambulatory Visit: Payer: Self-pay

## 2021-03-04 VITALS — BP 104/71 | HR 81 | Temp 98.2°F | Ht 66.0 in | Wt 179.0 lb

## 2021-03-04 DIAGNOSIS — E559 Vitamin D deficiency, unspecified: Secondary | ICD-10-CM

## 2021-03-04 DIAGNOSIS — E669 Obesity, unspecified: Secondary | ICD-10-CM

## 2021-03-04 DIAGNOSIS — R632 Polyphagia: Secondary | ICD-10-CM

## 2021-03-04 DIAGNOSIS — Z6829 Body mass index (BMI) 29.0-29.9, adult: Secondary | ICD-10-CM

## 2021-03-04 NOTE — Progress Notes (Signed)
Chief Complaint:   OBESITY Sherri Cobb is here to discuss her progress with her obesity treatment plan along with follow-up of her obesity related diagnoses. Sherri Cobb is on keeping a food journal and adhering to recommended goals of 1250 calories and 80 grams of protein daily and states she is following her eating plan approximately 85% of the time. Sherri Cobb states she is doing yoga and pilates for 30-45 minutes 2-3 times per week.  Today's visit was #: 8 Starting weight: 188 lbs Starting date: 11/13/2020 Today's weight: 179 lbs Today's date: 03/04/2021 Total lbs lost to date: 9 Total lbs lost since last in-office visit: 3  Interim History: Sherri Cobb is down 3 lbs since her last visit.  Subjective:   1. Vitamin D insufficiency Sherri Cobb is taking Vitamin D currently.  2. Sherri Cobb is taking Wegovy with mild constipation noted. She notes decreased hunger.  Assessment/Plan:   1. Vitamin D insufficiency Sherri Cobb will continue prescription Vitamin D 50,000 IU every week and will follow-up for routine testing of Vitamin D, at least 2-3 times per year to avoid over-replacement.  2. Polyphagia Intensive lifestyle modifications are the first line treatment for this issue. We discussed several lifestyle modifications today. Sherri Cobb will continue Wegovy, keep water intake high, and keep protein high at 80 grams per day. Increase fiber and flax seed. Orders and follow up as documented in patient record.  Counseling Polyphagia is excessive hunger. Causes can include: low blood sugars, hypERthyroidism, PMS, lack of sleep, stress, insulin resistance, diabetes, certain medications, and diets that are deficient in protein and fiber.   3. Obesity, with current BMI of 29.0 Sherri Cobb is currently in the action stage of change. As such, her goal is to continue with weight loss efforts. She has agreed to keeping a food journal and adhering to recommended goals of 1250 calories and 80 grams of  protein daily.   Meal planning. Intentional eating. Increase fresh, whole foods.  Exercise goals: As is.  Behavioral modification strategies: increasing lean protein intake, decreasing simple carbohydrates, increasing vegetables, increasing water intake, decreasing eating out, no skipping meals, meal planning and cooking strategies, keeping healthy foods in the home, and planning for success.  Sherri Cobb has agreed to follow-up with our clinic in 2 weeks. She was informed of the importance of frequent follow-up visits to maximize her success with intensive lifestyle modifications for her multiple health conditions.   Objective:   Blood pressure 104/71, pulse 81, temperature 98.2 F (36.8 C), height 5\' 6"  (1.676 m), weight 179 lb (81.2 kg), SpO2 98 %. Body mass index is 28.89 kg/m.  General: Cooperative, alert, well developed, in no acute distress. HEENT: Conjunctivae and lids unremarkable. Cardiovascular: Regular rhythm.  Lungs: Normal work of breathing. Neurologic: No focal deficits.   Lab Results  Component Value Date   CREATININE 0.94 11/13/2020   BUN 14 11/13/2020   NA 138 11/13/2020   K 4.8 11/13/2020   CL 103 11/13/2020   CO2 20 11/13/2020   Lab Results  Component Value Date   ALT 11 11/13/2020   AST 15 11/13/2020   ALKPHOS 49 11/13/2020   BILITOT 0.2 11/13/2020   Lab Results  Component Value Date   HGBA1C 5.1 11/13/2020   Lab Results  Component Value Date   INSULIN 13.2 11/13/2020   Lab Results  Component Value Date   TSH 2.150 11/13/2020   Lab Results  Component Value Date   CHOL 196 11/13/2020   HDL 50 11/13/2020   LDLCALC 129 (H)  11/13/2020   TRIG 96 11/13/2020   Lab Results  Component Value Date   VD25OH 27.12 (L) 12/19/2020   VD25OH 30.9 11/13/2020   Lab Results  Component Value Date   WBC 8.3 07/12/2020   HGB 13.2 07/12/2020   HCT 39.5 07/12/2020   MCV 88.2 07/12/2020   PLT 279 07/12/2020   Lab Results  Component Value Date   IRON 34  (L) 12/19/2020   TIBC 305 12/19/2020   FERRITIN 50 12/19/2020   Attestation Statements:   Reviewed by clinician on day of visit: allergies, medications, problem list, medical history, surgical history, family history, social history, and previous encounter notes.   Trude Mcburney, am acting as Energy manager for [employer], DO.  I have reviewed the above documentation for accuracy and completeness, and I agree with the above. Corinna Capra, DO

## 2021-03-15 ENCOUNTER — Other Ambulatory Visit (INDEPENDENT_AMBULATORY_CARE_PROVIDER_SITE_OTHER): Payer: Self-pay | Admitting: Bariatrics

## 2021-03-15 DIAGNOSIS — E669 Obesity, unspecified: Secondary | ICD-10-CM

## 2021-03-15 DIAGNOSIS — Z6830 Body mass index (BMI) 30.0-30.9, adult: Secondary | ICD-10-CM

## 2021-03-16 MED ORDER — WEGOVY 0.25 MG/0.5ML ~~LOC~~ SOAJ: 0.2500 mg | SUBCUTANEOUS | 0 refills | Status: DC

## 2021-03-16 NOTE — Telephone Encounter (Signed)
Dr.Brown 

## 2021-03-16 NOTE — Telephone Encounter (Signed)
LAST APPOINTMENT DATE: 03/04/21 ?NEXT APPOINTMENT DATE: 03/30/21 ? ? ?[facility] [phone] - [place], [place] - [address] ?[address] ? [place] [address] ?Phone: [phone] Fax: [phone] ? ?Patient is requesting a refill of the following medications: ?Pending Prescriptions:                       Disp   Refills ?  Semaglutide-Weight Management (WEGOVY) 0.2*2 mL   0       ?Sig: Inject 0.25 mg into the skin once a week. ? ? ?Date last filled: 02/16/21 ?Previously prescribed by Dr. Manson Passey ? ?Lab Results ?     Component                Value               Date                 ?     HGBA1C                   5.1                 11/13/2020           ?Lab Results ?     Component                Value               Date                 ?     LDLCALC                  129 (H)             11/13/2020           ?     CREATININE               0.94                11/13/2020           ?Lab Results ?     Component                Value               Date                 ?     VD25OH                   27.12 (L)           12/19/2020           ?     VD25OH                   30.9                11/13/2020           ? ?BP Readings from Last 3 Encounters: ?03/04/21 : 104/71 ?02/16/21 : 118/82 ?02/02/21 : 99/72 ? ?

## 2021-03-24 ENCOUNTER — Ambulatory Visit: Payer: 59 | Admitting: Rheumatology

## 2021-03-30 ENCOUNTER — Encounter (INDEPENDENT_AMBULATORY_CARE_PROVIDER_SITE_OTHER): Payer: Self-pay | Admitting: Family Medicine

## 2021-03-30 ENCOUNTER — Ambulatory Visit (INDEPENDENT_AMBULATORY_CARE_PROVIDER_SITE_OTHER): Payer: 59 | Admitting: Family Medicine

## 2021-03-30 ENCOUNTER — Other Ambulatory Visit: Payer: Self-pay

## 2021-03-30 VITALS — BP 103/72 | HR 83 | Temp 98.2°F | Ht 66.0 in | Wt 176.0 lb

## 2021-03-30 DIAGNOSIS — E66811 Obesity, class 1: Secondary | ICD-10-CM

## 2021-03-30 DIAGNOSIS — Z6828 Body mass index (BMI) 28.0-28.9, adult: Secondary | ICD-10-CM

## 2021-03-30 DIAGNOSIS — R632 Polyphagia: Secondary | ICD-10-CM | POA: Diagnosis not present

## 2021-03-30 DIAGNOSIS — E559 Vitamin D deficiency, unspecified: Secondary | ICD-10-CM | POA: Diagnosis not present

## 2021-03-30 DIAGNOSIS — E669 Obesity, unspecified: Secondary | ICD-10-CM

## 2021-03-30 DIAGNOSIS — Z9189 Other specified personal risk factors, not elsewhere classified: Secondary | ICD-10-CM

## 2021-03-30 MED ORDER — VITAMIN D (ERGOCALCIFEROL) 1.25 MG (50000 UNIT) PO CAPS: 50000.0000 [IU] | ORAL_CAPSULE | ORAL | 0 refills | Status: DC

## 2021-03-30 MED ORDER — WEGOVY 0.5 MG/0.5ML ~~LOC~~ SOAJ: 0.5000 mg | SUBCUTANEOUS | 0 refills | Status: DC

## 2021-04-02 NOTE — Progress Notes (Signed)
? ? ? ?Chief Complaint:  ? ?OBESITY ?Mitchell is here to discuss her progress with her obesity treatment plan along with follow-up of her obesity related diagnoses. Larue is on keeping a food journal and adhering to recommended goals of 1250 calories and 80 grams of protein and states she is following her eating plan approximately 85-90% of the time. Eda states she is walking, doing yoga and pilates for 30 minutes 3 times per week. ? ?Today's visit was #: 9 ?Starting weight: 188 lbs ?Starting date: 11/13/2020 ?Today's weight: 176 lbs ?Today's date: 03/30/2021 ?Total lbs lost to date: 12 lbs ?Total lbs lost since last in-office visit: 3 lbs ? ?Interim History: This is Darene's first office visit with me.  She is a patient of Dr. Manson Passey.  She has no hunger at all, denies cravings, and loves the [facility] as it enables her to have a healthy relationship with food.  She does not meet her protein goals everyday.  She is going on a cruise in 3 weeks. ? ?Subjective:  ? ?1. Polyphagia ?She has been on Wegovy 0.25 mg x6 weeks now.  Starting to "wean off" at the end of the week.  No side effects currently.  She endorses having constipation when she first started it. ? ?2. Vitamin D insufficiency ?She is currently taking prescription vitamin D 50,000 IU each week. She denies nausea, vomiting or muscle weakness. ? ?3. At risk for side effect of medication ?Thelia is at risk for medication side effect due to increasing her dose of Wegovy today. ? ?Assessment/Plan:  ?No orders of the defined types were placed in this encounter. ? ? ?Medications Discontinued During This Encounter  ?Medication Reason  ? Semaglutide-Weight Management (WEGOVY) 0.25 MG/0.5ML SOAJ Dose change  ? Vitamin D, Ergocalciferol, (DRISDOL) 1.25 MG (50000 UNIT) CAPS capsule Reorder  ?  ? ?Meds ordered this encounter  ?Medications  ? Vitamin D, Ergocalciferol, (DRISDOL) 1.25 MG (50000 UNIT) CAPS capsule  ?  Sig: Take 1 capsule (50,000 Units total) by mouth  every 7 (seven) days.  ?  Dispense:  4 capsule  ?  Refill:  0  ? Semaglutide-Weight Management (WEGOVY) 0.5 MG/0.5ML SOAJ  ?  Sig: Inject 0.5 mg into the skin once a week.  ?  Dispense:  2 mL  ?  Refill:  0  ?  ? ?1. Polyphagia ?Increase Wegovy to 0.5 mg weekly.  Risks/benefits of medication discussed with patient.  Okay to use phentermine sparingly still.  She uses it only 50% of the time or less, and declines the need for a refill today. ? ?- Increase Semaglutide-Weight Management (WEGOVY) 0.5 MG/0.5ML SOAJ; Inject 0.5 mg into the skin once a week.  Dispense: 2 mL; Refill: 0 ? ?2. Vitamin D insufficiency ?Low Vitamin D level contributes to fatigue and are associated with obesity, breast, and colon cancer. She agrees to continue to take prescription Vitamin D @50 ,000 IU every week and will follow-up for routine testing of Vitamin D, at least 2-3 times per year to avoid over-replacement. ? ?- Refill Vitamin D, Ergocalciferol, (DRISDOL) 1.25 MG (50000 UNIT) CAPS capsule; Take 1 capsule (50,000 Units total) by mouth every 7 (seven) days.  Dispense: 4 capsule; Refill: 0 ? ?3. At risk for side effect of medication ?Due to Leata's current conditions and medications, she is at a higher risk for drug side effect.  At least 10 minutes was spent on counseling her about these concerns today.  We discussed the benefits and potential risks of these  medications, and all of patient's concerns were addressed and questions were answered.  she will call us, or their PCP or other specialists who treat their conditions with medications, with any questions or concerns that may develop.   ? ?4. Obesity with current BMI of 28.4 ? ?Rasheema is currently in the action stage of change. As such, her goal is to continue with weight loss efforts. She has agreed to keeping a food journal and adhering to recommended goals of 1250 calories and 90+ grams of protein. ? ?Increase protein at dinner - goal is 50 grams minimum - and increase Wegovy.   ? ?Exercise goals:  As is. ? ?Behavioral modification strategies: increasing lean protein intake, travel eating strategies, planning for success, and keeping a strict food journal. ? ?Gillie has agreed to follow-up with our clinic in 3-4 weeks with Irene Limbo, FNP. She was informed of the importance of frequent follow-up visits to maximize her success with intensive lifestyle modifications for her multiple health conditions.  ? ?Objective:  ? ?Blood pressure 103/72, pulse 83, temperature 98.2 ?F (36.8 ?C), height 5\' 6"  (1.676 m), weight 176 lb (79.8 kg), SpO2 97 %. ?Body mass index is 28.41 kg/m?. ? ?General: Cooperative, alert, well developed, in no acute distress. ?HEENT: Conjunctivae and lids unremarkable. ?Cardiovascular: Regular rhythm.  ?Lungs: Normal work of breathing. ?Neurologic: No focal deficits.  ? ?Lab Results  ?Component Value Date  ? CREATININE 0.94 11/13/2020  ? BUN 14 11/13/2020  ? NA 138 11/13/2020  ? K 4.8 11/13/2020  ? CL 103 11/13/2020  ? CO2 20 11/13/2020  ? ?Lab Results  ?Component Value Date  ? ALT 11 11/13/2020  ? AST 15 11/13/2020  ? ALKPHOS 49 11/13/2020  ? BILITOT 0.2 11/13/2020  ? ?Lab Results  ?Component Value Date  ? HGBA1C 5.1 11/13/2020  ? ?Lab Results  ?Component Value Date  ? INSULIN 13.2 11/13/2020  ? ?Lab Results  ?Component Value Date  ? TSH 2.150 11/13/2020  ? ?Lab Results  ?Component Value Date  ? CHOL 196 11/13/2020  ? HDL 50 11/13/2020  ? LDLCALC 129 (H) 11/13/2020  ? TRIG 96 11/13/2020  ? ?Lab Results  ?Component Value Date  ? VD25OH 27.12 (L) 12/19/2020  ? VD25OH 30.9 11/13/2020  ? ?Lab Results  ?Component Value Date  ? WBC 8.3 07/12/2020  ? HGB 13.2 07/12/2020  ? HCT 39.5 07/12/2020  ? MCV 88.2 07/12/2020  ? PLT 279 07/12/2020  ? ?Lab Results  ?Component Value Date  ? IRON 34 (L) 12/19/2020  ? TIBC 305 12/19/2020  ? FERRITIN 50 12/19/2020  ? ?Attestation Statements:  ? ?Reviewed by clinician on day of visit: allergies, medications, problem list, medical  history, surgical history, family history, social history, and previous encounter notes. ? ?I, 14/09/2020, CMA, am acting as transcriptionist for Insurance claims handler, DO. ? ?I have reviewed the above documentation for accuracy and completeness, and I agree with the above. [employer], D.O. ? ?The 21st Century Cures Act was signed into law in 2016 which includes the topic of electronic health records.  This provides immediate access to information in MyChart.  This includes consultation notes, operative notes, office notes, lab results and pathology reports.  If you have any questions about what you read please let 2017 know at your next visit so we can discuss your concerns and take corrective action if need be.  We are right here with you. ? ?

## 2021-04-20 ENCOUNTER — Ambulatory Visit (INDEPENDENT_AMBULATORY_CARE_PROVIDER_SITE_OTHER): Payer: 59 | Admitting: Bariatrics

## 2021-04-20 ENCOUNTER — Encounter (INDEPENDENT_AMBULATORY_CARE_PROVIDER_SITE_OTHER): Payer: Self-pay | Admitting: Bariatrics

## 2021-04-20 VITALS — BP 127/86 | HR 98 | Temp 98.0°F | Ht 66.0 in | Wt 176.0 lb

## 2021-04-20 DIAGNOSIS — R632 Polyphagia: Secondary | ICD-10-CM

## 2021-04-20 DIAGNOSIS — E559 Vitamin D deficiency, unspecified: Secondary | ICD-10-CM

## 2021-04-20 DIAGNOSIS — Z6828 Body mass index (BMI) 28.0-28.9, adult: Secondary | ICD-10-CM | POA: Diagnosis not present

## 2021-04-20 DIAGNOSIS — E669 Obesity, unspecified: Secondary | ICD-10-CM | POA: Diagnosis not present

## 2021-04-20 MED ORDER — WEGOVY 0.5 MG/0.5ML ~~LOC~~ SOAJ: 0.5000 mg | SUBCUTANEOUS | 0 refills | Status: DC

## 2021-04-20 NOTE — Progress Notes (Signed)
? ? ? ?Chief Complaint:  ? ?OBESITY ?Sherri Cobb is here to discuss her progress with her obesity treatment plan along with follow-up of her obesity related diagnoses. Sherri Cobb is on keeping a food journal and adhering to recommended goals of 1250 calories and 80 plus grams of protein and states she is following her eating plan approximately 95% of the time. Sherri Cobb states she is walking for 30 minutes 7 times per week and strength training for 20 minutes 2 times per week. ? ?Today's visit was #: 10 ?Starting weight: 188 lbs ?Starting date: 11/13/2020 ?Today's weight: 176 lbs ?Today's date: 04/20/2021 ?Total lbs lost to date: 12 lbs ?Total lbs lost since last in-office visit: 0 ? ?Interim History: Sherri Cobb's weight remain the same has her last visit.  ? ?Subjective:  ? ?1. Polyphagia ?Sherri Cobb notes occasionally gastrointestinal symptoms with fried foods. She notes decreased appetite with [id number].  ? ?2. Vitamin D insufficiency ?Sherri Cobb is currently taking multivitamins.  ? ?Assessment/Plan:  ? ?1. Polyphagia ?We will refill Wegovy 0.5 mg for 1 month with no refills. Shew ill avoid fried foods. Intensive lifestyle modifications are the first line treatment for this issue. We discussed several lifestyle modifications today and she will continue to work on diet, exercise and weight loss efforts. Orders and follow up as documented in patient record. ? ?Counseling ?Polyphagia is excessive hunger. ?Causes can include: low blood sugars, hypERthyroidism, PMS, lack of sleep, stress, insulin resistance, diabetes, certain medications, and diets that are deficient in protein and fiber.   ? ?- Semaglutide-Weight Management (WEGOVY) 0.5 MG/0.5ML SOAJ; Inject 0.5 mg into the skin once a week.  Dispense: 2 mL; Refill: 0 ? ?2. Vitamin D insufficiency ?Low Vitamin D level contributes to fatigue and are associated with obesity, breast, and colon cancer. Sherri Cobb will continue multivitamins and she will follow-up for routine testing of Vitamin  D, at least 2-3 times per year to avoid over-replacement. ? ?3. Obesity with current BMI of 28.4 ?Sherri Cobb is currently in the action stage of change. As such, her goal is to continue with weight loss efforts. She has agreed to keeping a food journal and adhering to recommended goals of 1250 calories and 80 plus grams of protein.  ? ?Sherri Cobb will continue meal planning and she will be mindful eating.  ? ?Exercise goals:  As is. Sherri Cobb is doing well with her exercise.  ? ?Behavioral modification strategies: increasing lean protein intake, decreasing simple carbohydrates, increasing vegetables, increasing water intake, decreasing eating out, no skipping meals, meal planning and cooking strategies, keeping healthy foods in the home, and planning for success. ? ?Sherri Cobb has agreed to follow-up with our clinic in 4-5 weeks (fasting). She was informed of the importance of frequent follow-up visits to maximize her success with intensive lifestyle modifications for her multiple health conditions.  ? ?Objective:  ? ?Blood pressure 127/86, pulse 98, temperature 98 ?F (36.7 ?C), height 5\' 6"  (1.676 m), weight 176 lb (79.8 kg), SpO2 98 %. ?Body mass index is 28.41 kg/m?. ? ?General: Cooperative, alert, well developed, in no acute distress. ?HEENT: Conjunctivae and lids unremarkable. ?Cardiovascular: Regular rhythm.  ?Lungs: Normal work of breathing. ?Neurologic: No focal deficits.  ? ?Lab Results  ?Component Value Date  ? CREATININE 0.94 11/13/2020  ? BUN 14 11/13/2020  ? NA 138 11/13/2020  ? K 4.8 11/13/2020  ? CL 103 11/13/2020  ? CO2 20 11/13/2020  ? ?Lab Results  ?Component Value Date  ? ALT 11 11/13/2020  ? AST 15 11/13/2020  ? ALKPHOS  49 11/13/2020  ? BILITOT 0.2 11/13/2020  ? ?Lab Results  ?Component Value Date  ? HGBA1C 5.1 11/13/2020  ? ?Lab Results  ?Component Value Date  ? INSULIN 13.2 11/13/2020  ? ?Lab Results  ?Component Value Date  ? TSH 2.150 11/13/2020  ? ?Lab Results  ?Component Value Date  ? CHOL 196  11/13/2020  ? HDL 50 11/13/2020  ? LDLCALC 129 (H) 11/13/2020  ? TRIG 96 11/13/2020  ? ?Lab Results  ?Component Value Date  ? VD25OH 27.12 (L) 12/19/2020  ? VD25OH 30.9 11/13/2020  ? ?Lab Results  ?Component Value Date  ? WBC 8.3 07/12/2020  ? HGB 13.2 07/12/2020  ? HCT 39.5 07/12/2020  ? MCV 88.2 07/12/2020  ? PLT 279 07/12/2020  ? ?Lab Results  ?Component Value Date  ? IRON 34 (L) 12/19/2020  ? TIBC 305 12/19/2020  ? FERRITIN 50 12/19/2020  ? ?Attestation Statements:  ? ?Reviewed by clinician on day of visit: allergies, medications, problem list, medical history, surgical history, family history, social history, and previous encounter notes. ? ?I, Lizbeth Bark, RMA, am acting as transcriptionist for [employer], DO. ? ?I have reviewed the above documentation for accuracy and completeness, and I agree with the above. Jearld Lesch, DO ? ?

## 2021-04-21 ENCOUNTER — Encounter (INDEPENDENT_AMBULATORY_CARE_PROVIDER_SITE_OTHER): Payer: Self-pay | Admitting: Bariatrics

## 2021-04-23 ENCOUNTER — Ambulatory Visit (INDEPENDENT_AMBULATORY_CARE_PROVIDER_SITE_OTHER): Payer: 59 | Admitting: Nurse Practitioner

## 2021-04-27 ENCOUNTER — Other Ambulatory Visit (INDEPENDENT_AMBULATORY_CARE_PROVIDER_SITE_OTHER): Payer: Self-pay | Admitting: Family Medicine

## 2021-04-27 DIAGNOSIS — E559 Vitamin D deficiency, unspecified: Secondary | ICD-10-CM

## 2021-06-03 ENCOUNTER — Ambulatory Visit (INDEPENDENT_AMBULATORY_CARE_PROVIDER_SITE_OTHER): Payer: 59 | Admitting: Bariatrics

## 2021-06-03 ENCOUNTER — Encounter (INDEPENDENT_AMBULATORY_CARE_PROVIDER_SITE_OTHER): Payer: Self-pay | Admitting: Bariatrics

## 2021-06-03 VITALS — BP 95/68 | HR 75 | Temp 98.3°F | Ht 66.0 in | Wt 172.0 lb

## 2021-06-03 DIAGNOSIS — E559 Vitamin D deficiency, unspecified: Secondary | ICD-10-CM | POA: Diagnosis not present

## 2021-06-03 DIAGNOSIS — R632 Polyphagia: Secondary | ICD-10-CM

## 2021-06-03 DIAGNOSIS — E88819 Insulin resistance, unspecified: Secondary | ICD-10-CM

## 2021-06-03 DIAGNOSIS — E538 Deficiency of other specified B group vitamins: Secondary | ICD-10-CM

## 2021-06-03 DIAGNOSIS — Z6827 Body mass index (BMI) 27.0-27.9, adult: Secondary | ICD-10-CM

## 2021-06-03 DIAGNOSIS — E669 Obesity, unspecified: Secondary | ICD-10-CM

## 2021-06-03 DIAGNOSIS — E8881 Metabolic syndrome: Secondary | ICD-10-CM

## 2021-06-03 DIAGNOSIS — Z6830 Body mass index (BMI) 30.0-30.9, adult: Secondary | ICD-10-CM

## 2021-06-03 MED ORDER — VITAMIN D (ERGOCALCIFEROL) 1.25 MG (50000 UNIT) PO CAPS: 50000.0000 [IU] | ORAL_CAPSULE | ORAL | 0 refills | Status: DC

## 2021-06-03 MED ORDER — WEGOVY 0.5 MG/0.5ML ~~LOC~~ SOAJ: 0.5000 mg | SUBCUTANEOUS | 0 refills | Status: DC

## 2021-06-03 MED ORDER — PHENTERMINE HCL 15 MG PO CAPS: 15.0000 mg | ORAL_CAPSULE | Freq: Every day | ORAL | 0 refills | Status: DC

## 2021-06-04 ENCOUNTER — Encounter (INDEPENDENT_AMBULATORY_CARE_PROVIDER_SITE_OTHER): Payer: Self-pay

## 2021-06-04 ENCOUNTER — Telehealth (INDEPENDENT_AMBULATORY_CARE_PROVIDER_SITE_OTHER): Payer: Self-pay | Admitting: Bariatrics

## 2021-06-04 LAB — COMPREHENSIVE METABOLIC PANEL
ALT: 9 IU/L (ref 0–32)
AST: 10 IU/L (ref 0–40)
Albumin/Globulin Ratio: 1.7 (ref 1.2–2.2)
Albumin: 4.5 g/dL (ref 3.8–4.8)
Alkaline Phosphatase: 43 IU/L — ABNORMAL LOW (ref 44–121)
BUN/Creatinine Ratio: 16 (ref 9–23)
BUN: 14 mg/dL (ref 6–20)
Bilirubin Total: 0.4 mg/dL (ref 0.0–1.2)
CO2: 23 mmol/L (ref 20–29)
Calcium: 9.7 mg/dL (ref 8.7–10.2)
Chloride: 105 mmol/L (ref 96–106)
Creatinine, Ser: 0.85 mg/dL (ref 0.57–1.00)
Globulin, Total: 2.6 g/dL (ref 1.5–4.5)
Glucose: 87 mg/dL (ref 70–99)
Potassium: 4.5 mmol/L (ref 3.5–5.2)
Sodium: 142 mmol/L (ref 134–144)
Total Protein: 7.1 g/dL (ref 6.0–8.5)
eGFR: 90 mL/min/{1.73_m2} (ref >59)

## 2021-06-04 LAB — VITAMIN D 25 HYDROXY (VIT D DEFICIENCY, FRACTURES): Vit D, 25-Hydroxy: 45.5 ng/mL (ref 30.0–100.0)

## 2021-06-04 LAB — LIPID PANEL WITH LDL/HDL RATIO
Cholesterol, Total: 179 mg/dL (ref 100–199)
HDL: 44 mg/dL (ref >39)
LDL Chol Calc (NIH): 116 mg/dL — ABNORMAL HIGH (ref 0–99)
LDL/HDL Ratio: 2.6 ratio (ref 0.0–3.2)
Triglycerides: 102 mg/dL (ref 0–149)
VLDL Cholesterol Cal: 19 mg/dL (ref 5–40)

## 2021-06-04 LAB — HEMOGLOBIN A1C
Est. average glucose Bld gHb Est-mCnc: 100 mg/dL
Hgb A1c MFr Bld: 5.1 % (ref 4.8–5.6)

## 2021-06-04 LAB — INSULIN, RANDOM: INSULIN: 10.9 u[IU]/mL (ref 2.6–24.9)

## 2021-06-04 LAB — VITAMIN B12: Vitamin B-12: 461 pg/mL (ref 232–1245)

## 2021-06-04 NOTE — Telephone Encounter (Signed)
Dr. Manson Passey - Prior authorization approved for Phentermine. Effective:06/03/2021- 09/03/2021 Patient sent approval message via mychart.

## 2021-06-09 NOTE — Progress Notes (Unsigned)
Chief Complaint:   OBESITY Onyx is here to discuss her progress with her obesity treatment plan along with follow-up of her obesity related diagnoses. Franchelle is on keeping a food journal and adhering to recommended goals of 1250 calories and 80 grams of  protein and states she is following her eating plan approximately 75-80% of the time. Yanel states she is doing yoga for 30-45 minutes 2 times per week.  Today's visit was #: 11 Starting weight: 188 lbs Starting date: 11/13/2020 Today's weight: 172 lbs Today's date: 06/03/2021 Total lbs lost to date: 16 lbs Total lbs lost since last in-office visit: 4 lbs  Interim History: Anagabriela is down an additional 4 lbs and has done well overall.   Subjective:   1. Vitamin D insufficiency Kismet is taking Vitamin D as directed.   2. Polyphagia Anda is taking Reginal Lutes currently.   3. Insulin resistance Stephine is currently taking Wegovy. His last insulin level was 13.2.  4. B12 deficiency Rosea is not taking Vitamin B 12 consistently.   Assessment/Plan:   1. Vitamin D insufficiency Low Vitamin D level contributes to fatigue and are associated with obesity, breast, and colon cancer. We will refill prescription Vitamin D 50,000 IU every week for 1 month with no refills and Lamar will follow-up for routine testing of Vitamin D, at least 2-3 times per year to avoid over-replacement. We will check Vitamin D today.   - Vitamin D, Ergocalciferol, (DRISDOL) 1.25 MG (50000 UNIT) CAPS capsule; Take 1 capsule (50,000 Units total) by mouth every 7 (seven) days.  Dispense: 4 capsule; Refill: 0 - VITAMIN D 25 Hydroxy (Vit-D Deficiency, Fractures)  2. Polyphagia Intensive lifestyle modifications are the first line treatment for this issue. We will refill Wegovy 0.5 mg for 1 month with no refills. We will check labs today. We discussed several lifestyle modifications today and she will continue to work on diet, exercise and weight loss  efforts. Orders and follow up as documented in patient record.  Counseling Polyphagia is excessive hunger. Causes can include: low blood sugars, hypERthyroidism, PMS, lack of sleep, stress, insulin resistance, diabetes, certain medications, and diets that are deficient in protein and fiber.    - Semaglutide-Weight Management (WEGOVY) 0.5 MG/0.5ML SOAJ; Inject 0.5 mg into the skin once a week.  Dispense: 2 mL; Refill: 0  - Insulin, random - Hemoglobin A1c - Lipid Panel With LDL/HDL Ratio  3. Insulin resistance Shametra will continue taking Wegovy. She will keep all carbohydrates low. We will check labs today. She will continue to work on weight loss, exercise, and decreasing simple carbohydrates to help decrease the risk of diabetes. Harjit agreed to follow-up with [place] as directed to closely monitor her progress.  - Comprehensive metabolic panel - Insulin, random - Hemoglobin A1c - Lipid Panel With LDL/HDL Ratio  4. B12 deficiency The diagnosis was reviewed with the patient. We will check Vitamin B 12 today. Counseling provided today, see below. We will continue to monitor. Orders and follow up as documented in patient record.  Counseling The body needs vitamin B12: to make red blood cells; to make DNA; and to help the nerves work properly so they can carry messages from the brain to the body.  The main causes of vitamin B12 deficiency include dietary deficiency, digestive diseases, pernicious anemia, and having a surgery in which part of the stomach or small intestine is removed.  Certain medicines can make it harder for the body to absorb vitamin B12. These medicines  include: heartburn medications; some antibiotics; some medications used to treat diabetes, gout, and high cholesterol.  In some cases, there are no symptoms of this condition. If the condition leads to anemia or nerve damage, various symptoms can occur, such as weakness or fatigue, shortness of breath, and numbness or tingling  in your hands and feet.   Treatment:  May include taking vitamin B12 supplements.  Avoid alcohol.  Eat lots of healthy foods that contain vitamin B12: Beef, pork, chicken, [place], and organ meats, such as liver.  Seafood: This includes clams, rainbow trout, salmon, tuna, and haddock. Eggs.  Cereal and dairy products that are fortified: This means that vitamin B12 has been added to the food.   - Vitamin B12  5. Obesity, Current BMI 27.8 Marcel is currently in the action stage of change. As such, her goal is to continue with weight loss efforts. She has agreed to keeping a food journal and adhering to recommended goals of 1250 calories and 80 grams of protein.   Ronnett will continue meal planning and she will be mindful eating. We will refill phentermine 15 mg for 1 month with no refills.   - phentermine 15 MG capsule; Take 1 capsule (15 mg total) by mouth daily with lunch.  Dispense: 30 capsule; Refill: 0  Exercise goals:  As is  Behavioral modification strategies: increasing lean protein intake, decreasing simple carbohydrates, increasing vegetables, increasing water intake, decreasing eating out, no skipping meals, meal planning and cooking strategies, keeping healthy foods in the home, and planning for success.  Austine has agreed to follow-up with our clinic in 4 weeks. She was informed of the importance of frequent follow-up visits to maximize her success with intensive lifestyle modifications for her multiple health conditions.   Latrisha was informed we would discuss her lab results at her next visit unless there is a critical issue that needs to be addressed sooner. Jendaya agreed to keep her next visit at the agreed upon time to discuss these results.  Objective:   Blood pressure 95/68, pulse 75, temperature 98.3 F (36.8 C), height 5\' 6"  (1.676 m), weight 172 lb (78 kg), SpO2 98 %. Body mass index is 27.76 kg/m.  General: Cooperative, alert, well developed, in no acute  distress. HEENT: Conjunctivae and lids unremarkable. Cardiovascular: Regular rhythm.  Lungs: Normal work of breathing. Neurologic: No focal deficits.   Lab Results  Component Value Date   CREATININE 0.85 06/03/2021   BUN 14 06/03/2021   NA 142 06/03/2021   K 4.5 06/03/2021   CL 105 06/03/2021   CO2 23 06/03/2021   Lab Results  Component Value Date   ALT 9 06/03/2021   AST 10 06/03/2021   ALKPHOS 43 (L) 06/03/2021   BILITOT 0.4 06/03/2021   Lab Results  Component Value Date   HGBA1C 5.1 06/03/2021   HGBA1C 5.1 11/13/2020   Lab Results  Component Value Date   INSULIN 10.9 06/03/2021   INSULIN 13.2 11/13/2020   Lab Results  Component Value Date   TSH 2.150 11/13/2020   Lab Results  Component Value Date   CHOL 179 06/03/2021   HDL 44 06/03/2021   LDLCALC 116 (H) 06/03/2021   TRIG 102 06/03/2021   Lab Results  Component Value Date   VD25OH 45.5 06/03/2021   VD25OH 27.12 (L) 12/19/2020   VD25OH 30.9 11/13/2020   Lab Results  Component Value Date   WBC 8.3 07/12/2020   HGB 13.2 07/12/2020   HCT 39.5 07/12/2020   MCV  88.2 07/12/2020   PLT 279 07/12/2020   Lab Results  Component Value Date   IRON 34 (L) 12/19/2020   TIBC 305 12/19/2020   FERRITIN 50 12/19/2020   Attestation Statements:   Reviewed by clinician on day of visit: allergies, medications, problem list, medical history, surgical history, family history, social history, and previous encounter notes.  I, Jackson LatinoAlisa White, RMA, am acting as Energy managertranscriptionist for [employer] Energy[employer]el Mavis Fichera, DO.  I have reviewed the above documentation for accuracy and completeness, and I agree with the above. Corinna Capra-  Murna Backer, DO

## 2021-06-10 ENCOUNTER — Encounter (INDEPENDENT_AMBULATORY_CARE_PROVIDER_SITE_OTHER): Payer: Self-pay | Admitting: Bariatrics

## 2021-06-22 ENCOUNTER — Encounter (INDEPENDENT_AMBULATORY_CARE_PROVIDER_SITE_OTHER): Payer: Self-pay | Admitting: Bariatrics

## 2021-06-29 ENCOUNTER — Ambulatory Visit (INDEPENDENT_AMBULATORY_CARE_PROVIDER_SITE_OTHER): Payer: 59 | Admitting: Bariatrics

## 2021-06-29 ENCOUNTER — Encounter (INDEPENDENT_AMBULATORY_CARE_PROVIDER_SITE_OTHER): Payer: Self-pay | Admitting: Bariatrics

## 2021-06-29 VITALS — BP 90/61 | HR 86 | Temp 98.2°F | Ht 66.0 in | Wt 170.0 lb

## 2021-06-29 DIAGNOSIS — Z6827 Body mass index (BMI) 27.0-27.9, adult: Secondary | ICD-10-CM

## 2021-06-29 DIAGNOSIS — R632 Polyphagia: Secondary | ICD-10-CM

## 2021-06-29 DIAGNOSIS — E559 Vitamin D deficiency, unspecified: Secondary | ICD-10-CM

## 2021-06-29 DIAGNOSIS — E669 Obesity, unspecified: Secondary | ICD-10-CM | POA: Diagnosis not present

## 2021-06-29 MED ORDER — VITAMIN D (ERGOCALCIFEROL) 1.25 MG (50000 UNIT) PO CAPS: 50000.0000 [IU] | ORAL_CAPSULE | ORAL | 0 refills | Status: DC

## 2021-06-29 MED ORDER — PHENTERMINE HCL 15 MG PO CAPS: 15.0000 mg | ORAL_CAPSULE | Freq: Every day | ORAL | 0 refills | Status: DC

## 2021-06-30 NOTE — Progress Notes (Unsigned)
Chief Complaint:   OBESITY Sherri Cobb is here to discuss her progress with her obesity treatment plan along with follow-up of her obesity related diagnoses. Sherri Cobb is on keeping a food journal and adhering to recommended goals of 1250 calories and 80 grams of protein daily and states she is following her eating plan approximately 75% of the time. Sherri Cobb states she is doing yoga for 30 minutes 2-3 times per week.  Today's visit was #: 12 Starting weight: 188 lbs Starting date: 11/13/2020 Today's weight: 170 lbs Today's date: 06/29/2021 Total lbs lost to date: 18 Total lbs lost since last in-office visit: 2  Interim History: Sherri Cobb is down 2 pounds since her last visit.  She is taking phentermine and it helps with her appetite.  Subjective:   1. Polyphagia Sherri Cobb is taking Wegovy 0.5 mg weekly.  2. Vitamin D insufficiency Sherri Cobb is taking vitamin D prescription.  Assessment/Plan:   1. Polyphagia Sherri Cobb will continue Wegovy 0.5 mg once weekly, and we will refill for 1 month.  2. Vitamin D insufficiency Sherri Cobb will continue prescription vitamin D 50,000 units weekly, and we will refill for 1 month.  - Vitamin D, Ergocalciferol, (DRISDOL) 1.25 MG (50000 UNIT) CAPS capsule; Take 1 capsule (50,000 Units total) by mouth every 7 (seven) days.  Dispense: 4 capsule; Refill: 0  3. Obesity, Current BMI 27.4 Sherri Cobb is currently in the action stage of change. As such, her goal is to continue with weight loss efforts. She has agreed to keeping a food journal and adhering to recommended goals of 1250 calories and 80 grams of protein daily.   Sherri Cobb will continue journaling.  Reviewed labs from 06/03/2021, CMP, lipids, A1c, and vitamin D.  We discussed various medication options to help Sherri Cobb with her weight loss efforts and we both agreed to continue phentermine 15 mg daily with lunch, and we will refill for 1 month.  - phentermine 15 MG capsule; Take 1 capsule (15 mg total) by  mouth daily with lunch.  Dispense: 30 capsule; Refill: 0  Exercise goals: As is.  Behavioral modification strategies: increasing lean protein intake, decreasing simple carbohydrates, increasing vegetables, increasing water intake, decreasing eating out, no skipping meals, meal planning and cooking strategies, keeping healthy foods in the home, and planning for success.  Sherri Cobb has agreed to follow-up with our clinic in 3 to 4 weeks. She was informed of the importance of frequent follow-up visits to maximize her success with intensive lifestyle modifications for her multiple health conditions.   Objective:   Blood pressure 90/61, pulse 86, temperature 98.2 F (36.8 C), height 5\' 6"  (1.676 m), weight 170 lb (77.1 kg), SpO2 96 %. Body mass index is 27.44 kg/m.  General: Cooperative, alert, well developed, in no acute distress. HEENT: Conjunctivae and lids unremarkable. Cardiovascular: Regular rhythm.  Lungs: Normal work of breathing. Neurologic: No focal deficits.   Lab Results  Component Value Date   CREATININE 0.85 06/03/2021   BUN 14 06/03/2021   NA 142 06/03/2021   K 4.5 06/03/2021   CL 105 06/03/2021   CO2 23 06/03/2021   Lab Results  Component Value Date   ALT 9 06/03/2021   AST 10 06/03/2021   ALKPHOS 43 (L) 06/03/2021   BILITOT 0.4 06/03/2021   Lab Results  Component Value Date   HGBA1C 5.1 06/03/2021   HGBA1C 5.1 11/13/2020   Lab Results  Component Value Date   INSULIN 10.9 06/03/2021   INSULIN 13.2 11/13/2020   Lab Results  Component  Value Date   TSH 2.150 11/13/2020   Lab Results  Component Value Date   CHOL 179 06/03/2021   HDL 44 06/03/2021   LDLCALC 116 (H) 06/03/2021   TRIG 102 06/03/2021   Lab Results  Component Value Date   VD25OH 45.5 06/03/2021   VD25OH 27.12 (L) 12/19/2020   VD25OH 30.9 11/13/2020   Lab Results  Component Value Date   WBC 8.3 07/12/2020   HGB 13.2 07/12/2020   HCT 39.5 07/12/2020   MCV 88.2 07/12/2020   PLT 279  07/12/2020   Lab Results  Component Value Date   IRON 34 (L) 12/19/2020   TIBC 305 12/19/2020   FERRITIN 50 12/19/2020   Attestation Statements:   Reviewed by clinician on day of visit: allergies, medications, problem list, medical history, surgical history, family history, social history, and previous encounter notes.   Trude Mcburney, am acting as Energy manager for [employer], DO.  I have reviewed the above documentation for accuracy and completeness, and I agree with the above. -  ***

## 2021-07-04 ENCOUNTER — Encounter (INDEPENDENT_AMBULATORY_CARE_PROVIDER_SITE_OTHER): Payer: Self-pay | Admitting: Bariatrics

## 2021-07-04 MED ORDER — WEGOVY 0.5 MG/0.5ML ~~LOC~~ SOAJ: 0.5000 mg | SUBCUTANEOUS | 0 refills | Status: DC

## 2021-07-13 ENCOUNTER — Other Ambulatory Visit (HOSPITAL_COMMUNITY): Payer: Self-pay

## 2021-07-13 ENCOUNTER — Other Ambulatory Visit (INDEPENDENT_AMBULATORY_CARE_PROVIDER_SITE_OTHER): Payer: Self-pay | Admitting: Bariatrics

## 2021-07-13 DIAGNOSIS — R632 Polyphagia: Secondary | ICD-10-CM

## 2021-07-13 MED ORDER — WEGOVY 0.5 MG/0.5ML ~~LOC~~ SOAJ
0.5000 mg | SUBCUTANEOUS | 0 refills | Status: DC
  Filled 2021-07-13: qty 2, 28d supply, fill #0

## 2021-07-13 NOTE — Telephone Encounter (Signed)
Please review

## 2021-07-15 ENCOUNTER — Other Ambulatory Visit (HOSPITAL_COMMUNITY): Payer: Self-pay

## 2021-07-16 ENCOUNTER — Other Ambulatory Visit (HOSPITAL_COMMUNITY): Payer: Self-pay

## 2021-07-21 NOTE — Progress Notes (Unsigned)
Office Visit Note  Patient: Sherri Cobb             Date of Birth: [birth year]           MRN: [id number]             PCP: Ma Hillock, DO Referring: Ma Hillock, DO Visit Date: 07/22/2021 Occupation: @GUAROCC @  Subjective:  No chief complaint on file.   History of Present Illness: Sherri Cobb is a 41 y.o. female ***   Activities of Daily Living:  Patient reports morning stiffness for *** {minute/hour:19697}.   Patient {ACTIONS;DENIES/REPORTS:21021675::"Denies"} nocturnal pain.  Difficulty dressing/grooming: {ACTIONS;DENIES/REPORTS:21021675::"Denies"} Difficulty climbing stairs: {ACTIONS;DENIES/REPORTS:21021675::"Denies"} Difficulty getting out of chair: {ACTIONS;DENIES/REPORTS:21021675::"Denies"} Difficulty using hands for taps, buttons, cutlery, and/or writing: {ACTIONS;DENIES/REPORTS:21021675::"Denies"}  No Rheumatology ROS completed.   PMFS History:  Patient Active Problem List   Diagnosis Date Noted   Iron deficiency 12/22/2020   Vitamin D insufficiency 12/22/2020   B12 deficiency 12/22/2020   Class 1 obesity with serious comorbidity and body mass index (BMI) of 30.0 to 30.9 in adult 11/17/2020   Psoriasis 11/14/2020   Migraine headache 11/14/2020   Menometrorrhagia    Perennial allergic rhinitis 03/01/2016    Past Medical History:  Diagnosis Date   Allergies    Allergy    Back pain    Chicken pox    Fatty liver    Gallbladder problem    Hx of vaginitis 12/10/2017   Recurrent yeast with Mirena IUD Reported history of uterine infection, but unable to find that in her Care Everywhere chart.   Lactose intolerance    Menometrorrhagia    Before BCPs   Migraines    Psoriasis     Family History  Problem Relation Age of Onset   Hypertension Mother    Hyperlipidemia Mother    Anxiety disorder Mother    Sleep apnea Mother    Thyroid disease Father    Depression Father    Gout Father    Kidney Stones Father    Miscarriages / Stillbirths  Sister    Anxiety disorder Daughter    Depression Daughter    Hypertension Maternal Grandmother    Depression Maternal Grandmother    Skin cancer Maternal Grandmother    Diverticulitis Maternal Grandmother    Past Surgical History:  Procedure Laterality Date   CHOLECYSTECTOMY N/A 07/12/2020   Procedure: LAPAROSCOPIC CHOLECYSTECTOMY;  Surgeon: Ileana Roup, MD;  Location: [facility];  Service: General;  Laterality: N/A;   KNEE ARTHROSCOPY WITH LATERAL RELEASE Right 12/27/2019   Procedure: KNEE ARTHROSCOPY WITH LATERAL RELEASE; REMOVAL LOOSE FOREIGN BODY; DEBRIDEMENT/SHAVING CHONDROPLASTY;  Surgeon: Hiram Gash, MD;  Location: [facility];  Service: Orthopedics;  Laterality: Right;   LASIK Bilateral 2016   TIBIAL TUBERCLERPLASTY Right 12/27/2019   Procedure: LIGAMENT RECONSTRUCTION KNEE EXTRA-ARTICULAR, ANTERIOR TIBIAL TUBERCLEPLASTY, DECOMPRESSION FASCIOTOMY, LEG ANTERIOR;  Surgeon: Hiram Gash, MD;  Location: [place];  Service: Orthopedics;  Laterality: Right;   WISDOM TOOTH EXTRACTION     Social History   Social History Narrative   Marital status/children/pets: Single/Div., 2 daughters   Education/employment: associates degree, administrative work   Engineer, materials:      -smoke alarm in the home:Yes     - wears seatbelt: Yes     - Feels safe in their relationships: Yes      Immunization History  Administered Date(s) Administered   Influenza-Unspecified 12/10/2017, 09/14/2018, 10/11/2020   PFIZER(Purple Top)SARS-COV-2 Vaccination 03/23/2019, 04/16/2019, 12/15/2019   Pfizer Covid-19  Vaccine Bivalent Booster 44yr & up 11/02/2020   Tdap 01/11/2014     Objective: Vital Signs: There were no vitals taken for this visit.   Physical Exam   Musculoskeletal Exam: ***  CDAI Exam: CDAI Score: -- Patient Global: --; Provider Global: -- Swollen: --; Tender: -- Joint Exam 07/22/2021   No joint exam has been documented for this visit   There is  currently no information documented on the homunculus. Go to the Rheumatology activity and complete the homunculus joint exam.  Investigation: No additional findings.  Imaging: No results found.  Recent Labs: Lab Results  Component Value Date   WBC 8.3 07/12/2020   HGB 13.2 07/12/2020   PLT 279 07/12/2020   NA 142 06/03/2021   K 4.5 06/03/2021   CL 105 06/03/2021   CO2 23 06/03/2021   GLUCOSE 87 06/03/2021   BUN 14 06/03/2021   CREATININE 0.85 06/03/2021   BILITOT 0.4 06/03/2021   ALKPHOS 43 (L) 06/03/2021   AST 10 06/03/2021   ALT 9 06/03/2021   PROT 7.1 06/03/2021   ALBUMIN 4.5 06/03/2021   CALCIUM 9.7 06/03/2021   GFRAA 93 09/15/2017    Speciality Comments: No specialty comments available.  Procedures:  No procedures performed Allergies: Patient has no known allergies.   Assessment / Plan:     Visit Diagnoses: HLA B27 (HLA B27 positive) - 12/19/20: HLA-B27+, ESR 27, anti-CCP<16, RF<14, HIV-, ANA 1:80NS, iron 34, ferritin 50, dsDNA 3, scl-70-, Sm-, Ro-, La-   Lumbar pain  Elevated sed rate  ANA positive - 12/19/20: ANA 1:80NS, ENA negative  Psoriasis  Vitamin D insufficiency  B12 deficiency  Iron deficiency  Menometrorrhagia  Perennial allergic rhinitis  Hx of migraines  Orders: No orders of the defined types were placed in this encounter.  No orders of the defined types were placed in this encounter.   Face-to-face time spent with patient was *** minutes. Greater than 50% of time was spent in counseling and coordination of care.  Follow-Up Instructions: No follow-ups on file.   TOfilia Neas PA-C  Note - This record has been created using Dragon software.  Chart creation errors have been sought, but may not always  have been located. Such creation errors do not reflect on  the standard of medical care.

## 2021-07-22 ENCOUNTER — Ambulatory Visit (INDEPENDENT_AMBULATORY_CARE_PROVIDER_SITE_OTHER): Payer: 59

## 2021-07-22 ENCOUNTER — Encounter: Payer: Self-pay | Admitting: Rheumatology

## 2021-07-22 ENCOUNTER — Ambulatory Visit: Payer: 59 | Admitting: Rheumatology

## 2021-07-22 VITALS — BP 110/80 | HR 92 | Ht 66.0 in | Wt 174.4 lb

## 2021-07-22 DIAGNOSIS — M25561 Pain in right knee: Secondary | ICD-10-CM

## 2021-07-22 DIAGNOSIS — Z840 Family history of diseases of the skin and subcutaneous tissue: Secondary | ICD-10-CM

## 2021-07-22 DIAGNOSIS — J3089 Other allergic rhinitis: Secondary | ICD-10-CM

## 2021-07-22 DIAGNOSIS — M545 Low back pain, unspecified: Secondary | ICD-10-CM

## 2021-07-22 DIAGNOSIS — E559 Vitamin D deficiency, unspecified: Secondary | ICD-10-CM

## 2021-07-22 DIAGNOSIS — M249 Joint derangement, unspecified: Secondary | ICD-10-CM

## 2021-07-22 DIAGNOSIS — R700 Elevated erythrocyte sedimentation rate: Secondary | ICD-10-CM

## 2021-07-22 DIAGNOSIS — M533 Sacrococcygeal disorders, not elsewhere classified: Secondary | ICD-10-CM

## 2021-07-22 DIAGNOSIS — M62838 Other muscle spasm: Secondary | ICD-10-CM

## 2021-07-22 DIAGNOSIS — L409 Psoriasis, unspecified: Secondary | ICD-10-CM

## 2021-07-22 DIAGNOSIS — G8929 Other chronic pain: Secondary | ICD-10-CM

## 2021-07-22 DIAGNOSIS — Z1589 Genetic susceptibility to other disease: Secondary | ICD-10-CM

## 2021-07-22 DIAGNOSIS — Z8669 Personal history of other diseases of the nervous system and sense organs: Secondary | ICD-10-CM

## 2021-07-22 DIAGNOSIS — E538 Deficiency of other specified B group vitamins: Secondary | ICD-10-CM

## 2021-07-22 DIAGNOSIS — E611 Iron deficiency: Secondary | ICD-10-CM

## 2021-07-22 DIAGNOSIS — R768 Other specified abnormal immunological findings in serum: Secondary | ICD-10-CM

## 2021-07-22 DIAGNOSIS — M7918 Myalgia, other site: Secondary | ICD-10-CM

## 2021-07-22 DIAGNOSIS — N921 Excessive and frequent menstruation with irregular cycle: Secondary | ICD-10-CM

## 2021-07-22 LAB — SEDIMENTATION RATE: Sed Rate: 9 mm/h (ref 0–20)

## 2021-07-23 NOTE — Progress Notes (Signed)
Office Visit Note  Patient: Sherri Cobb             Date of Birth: 1980/01/12           MRN: [id number]             PCP: Ma Hillock, DO Referring: Ma Hillock, DO Visit Date: 08/06/2021 Occupation: @GUAROCC @  Subjective:  Fatigue, muscle pain and joint pain  History of Present Illness: Sherri Cobb is a 41 y.o. female with history of psoriasis, hypermobility and myofascial pain.  She states she continues to have fatigue and generalized achiness.  She has not noticed any joint swelling.  She continues to have some lower back pain and SI joint discomfort.  She denies any history of Planter fasciitis or Achilles tendinitis.  There is no history of uveitis.  There is no history of oral ulcers, nasal ulcers, sicca symptoms, Raynaud's phenomenon, malar rash, photosensitivity or lymphadenopathy.  She states her right knee joint continues to cause discomfort especially when she is kneeling.  She gets only occasional rash from psoriasis.  She states that she has not a scheduled an appointment with the physical therapy yet.  Activities of Daily Living:  Patient reports morning stiffness for 0 minutes.   Patient Denies nocturnal pain.  Difficulty dressing/grooming: Denies Difficulty climbing stairs: Denies Difficulty getting out of chair: Denies Difficulty using hands for taps, buttons, cutlery, and/or writing: Denies  Review of Systems  Constitutional:  Positive for fatigue.  HENT:  Negative for mouth sores and mouth dryness.   Eyes:  Negative for dryness.  Respiratory:  Negative for shortness of breath.   Cardiovascular:  Negative for chest pain and palpitations.  Gastrointestinal:  Negative for blood in stool, constipation and diarrhea.  Endocrine: Negative for increased urination.  Genitourinary:  Negative for involuntary urination.  Musculoskeletal:  Positive for myalgias and myalgias. Negative for joint pain, joint pain, joint swelling, muscle weakness, morning stiffness  and muscle tenderness.  Skin:  Negative for color change, rash, hair loss and sensitivity to sunlight.  Allergic/Immunologic: Negative for susceptible to infections.  Neurological:  Negative for dizziness and headaches.  Hematological:  Negative for swollen glands.  Psychiatric/Behavioral:  Negative for depressed mood and sleep disturbance. The patient is not nervous/anxious.     PMFS History:  Patient Active Problem List   Diagnosis Date Noted   Iron deficiency 12/22/2020   Vitamin D insufficiency 12/22/2020   B12 deficiency 12/22/2020   Class 1 obesity with serious comorbidity and body mass index (BMI) of 30.0 to 30.9 in adult 11/17/2020   Psoriasis 11/14/2020   Migraine headache 11/14/2020   Menometrorrhagia    Perennial allergic rhinitis 03/01/2016    Past Medical History:  Diagnosis Date   Allergies    Allergy    Back pain    Chicken pox    Fatty liver    Gallbladder problem    Hx of vaginitis 12/10/2017   Recurrent yeast with Mirena IUD Reported history of uterine infection, but unable to find that in her Care Everywhere chart.   Lactose intolerance    Menometrorrhagia    Before BCPs   Migraines    Psoriasis     Family History  Problem Relation Age of Onset   Hypertension Mother    Hyperlipidemia Mother    Anxiety disorder Mother    Sleep apnea Mother    Thyroid disease Father    Depression Father    Gout Father    Kidney Stones  Father    Miscarriages / Stillbirths Sister    Hypertension Maternal Grandmother    Depression Maternal Grandmother    Skin cancer Maternal Grandmother    Diverticulitis Maternal Grandmother    Anxiety disorder Daughter    Depression Daughter    ADD / ADHD Daughter    Past Surgical History:  Procedure Laterality Date   CHOLECYSTECTOMY N/A 07/12/2020   Procedure: LAPAROSCOPIC CHOLECYSTECTOMY;  Surgeon: Ileana Roup, MD;  Location: [facility];  Service: General;  Laterality: N/A;   KNEE ARTHROSCOPY WITH LATERAL RELEASE Right  12/27/2019   Procedure: KNEE ARTHROSCOPY WITH LATERAL RELEASE; REMOVAL LOOSE FOREIGN BODY; DEBRIDEMENT/SHAVING CHONDROPLASTY;  Surgeon: Hiram Gash, MD;  Location: [facility];  Service: Orthopedics;  Laterality: Right;   LASIK Bilateral 2016   TIBIAL TUBERCLERPLASTY Right 12/27/2019   Procedure: LIGAMENT RECONSTRUCTION KNEE EXTRA-ARTICULAR, ANTERIOR TIBIAL TUBERCLEPLASTY, DECOMPRESSION FASCIOTOMY, LEG ANTERIOR;  Surgeon: Hiram Gash, MD;  Location: [place];  Service: Orthopedics;  Laterality: Right;   WISDOM TOOTH EXTRACTION     Social History   Social History Narrative   Marital status/children/pets: Single/Div., 2 daughters   Education/employment: associates degree, administrative work   Engineer, materials:      -smoke alarm in the home:Yes     - wears seatbelt: Yes     - Feels safe in their relationships: Yes      Immunization History  Administered Date(s) Administered   Influenza-Unspecified 12/10/2017, 09/14/2018, 10/11/2020   PFIZER(Purple Top)SARS-COV-2 Vaccination 03/23/2019, 04/16/2019, 12/15/2019   Pfizer Covid-19 Vaccine Bivalent Booster 53yr & up 11/02/2020   Tdap 01/11/2014     Objective: Vital Signs: BP 108/76 (BP Location: Left Arm, Patient Position: Sitting, Cuff Size: Normal)   Pulse 80   Ht 5' 6"  (1.676 m)   Wt 175 lb 9.6 oz (79.7 kg)   BMI 28.34 kg/m    Physical Exam Vitals and nursing note reviewed.  Constitutional:      Appearance: She is well-developed.  HENT:     Head: Normocephalic and atraumatic.  Eyes:     Conjunctiva/sclera: Conjunctivae normal.  Cardiovascular:     Rate and Rhythm: Normal rate and regular rhythm.     Heart sounds: Normal heart sounds.  Pulmonary:     Effort: Pulmonary effort is normal.     Breath sounds: Normal breath sounds.  Abdominal:     General: Bowel sounds are normal.     Palpations: Abdomen is soft.  Musculoskeletal:     Cervical back: Normal range of motion.  Lymphadenopathy:      Cervical: No cervical adenopathy.  Skin:    General: Skin is warm and dry.     Capillary Refill: Capillary refill takes less than 2 seconds.  Neurological:     Mental Status: She is alert and oriented to person, place, and time.  Psychiatric:        Behavior: Behavior normal.      Musculoskeletal Exam: Cervical, thoracic and lumbar spine were in good range of motion.  She had no discomfort over lumbar spine on palpation.  She had mild tenderness over SI joints.  Shoulder joints, elbow joints, wrist joints, MCPs PIPs and DIPs with good range of motion with hypermobility in all of her joints.  Hip joints and knee joints with good range of motion.  Warmth was noted on the palpation of her right knee joint.  Postsurgical scarring was noted on the right knee joint.  Left knee joint was in good range of motion without  any warmth swelling or effusion.  She had no synovitis over ankles or MTPs.  There was no planter fasciitis or Achilles tendinitis.  CDAI Exam: CDAI Score: -- Patient Global: --; Provider Global: -- Swollen: --; Tender: -- Joint Exam 08/06/2021   No joint exam has been documented for this visit   There is currently no information documented on the homunculus. Go to the Rheumatology activity and complete the homunculus joint exam.  Investigation: No additional findings.  Imaging: XR Pelvis 1-2 Views  Result Date: 07/22/2021 Mild SI joint sclerosis was noted bilaterally.  No SI joint narrowing or erosive changes were noted. Impression: Mild SI joint sclerosis was noted which could be consistent with osteoarthritis.  XR Lumbar Spine 2-3 Views  Result Date: 07/22/2021 Levoscoliosis was noted.  No significant disc space narrowing was noted.  Anterior spurring was noted.  Facet joint arthropathy was noted. Impression: These findings are consistent with scoliosis, degenerative changes and facet joint arthropathy.  XR KNEE 3 VIEW RIGHT  Result Date: 07/22/2021 Moderate lateral  compartment narrowing was noted.  No patellofemoral narrowing was noted.  Screws were noted t in the tibia. Impression: These findings are consistent with moderate osteoarthritis of the knee joint.   Recent Labs: Lab Results  Component Value Date   WBC 8.3 07/12/2020   HGB 13.2 07/12/2020   PLT 279 07/12/2020   NA 142 06/03/2021   K 4.5 06/03/2021   CL 105 06/03/2021   CO2 23 06/03/2021   GLUCOSE 87 06/03/2021   BUN 14 06/03/2021   CREATININE 0.85 06/03/2021   BILITOT 0.4 06/03/2021   ALKPHOS 43 (L) 06/03/2021   AST 10 06/03/2021   ALT 9 06/03/2021   PROT 7.1 06/03/2021   ALBUMIN 4.5 06/03/2021   CALCIUM 9.7 06/03/2021   GFRAA 93 09/15/2017    Speciality Comments: No specialty comments available.  Procedures:  No procedures performed Allergies: Patient has no known allergies.   Assessment / Plan:     Visit Diagnoses: DDD (degenerative disc disease), lumbar - Lumbar spine x-ray shows mild anterior spurring and facet joint arthropathy.  X-ray findings were reviewed with the patient.  She will benefit from core strengthening exercises.  She will schedule an appointment with the physical therapist.  I also gave her a handout on back exercises.  She also has mild scoliosis.  Chronic SI joint pain - Mild SI joint sclerosis was noted consistent with osteoarthritis.  She has mild tenderness over SI joints.  She does not have typical symptoms of sacroiliitis.  She has no morning stiffness.  Her sedimentation rate on July 22, 2021 was 9.  Primary osteoarthritis of right knee - Moderate osteoarthritis knee joint was noted on the x-rays.  July 22, 2021.  X-ray findings were reviewed with the patient.  She continues to have pain and discomfort in her right knee joint and had warmth on palpation.  She had surgery by Dr. Griffin Basil.  She will schedule appointment with him.  Trapezius muscle spasm-most likely related to myofascial pain syndrome.  She will benefit from physical  therapy.  Psoriasis-she gets only occasional psoriasis lesions.  HLA B27 (HLA B27 positive)-no evidence of inflammatory arthritis or tendinitis was noted today.  I advised her to contact me if she develops any new symptoms.  ANA positive-her ANA was low titer positive and ENA was negative.  She has no clinical features of autoimmune disease.  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  She also has 2 daughters who  have hypermobility per patient.  Benefits of isometric exercises were discussed.  She will discuss this further with her physical therapist.  Myofascial pain-she gives history of generalized pain, fatigue and has positive tender points.  There is history of fibromyalgia in her maternal grandmother.  Detailed counsel regarding fibromyalgia and myofascial pain syndrome was provided.  Daily exercise was emphasized.  She practices yoga.  She may benefit from water aerobics and swimming.  I also advised her to discuss possible use of Cymbalta with Dr. Raoul Pitch.  Vitamin D insufficiency-she had history of vitamin D deficiency.  Repeat vitamin D level was normal.  Other medical problems are listed as follows:  B12 deficiency  Iron deficiency  Hx of migraines  Menometrorrhagia  Perennial allergic rhinitis  Family history of psoriasis in mother  Orders: No orders of the defined types were placed in this encounter.  No orders of the defined types were placed in this encounter.   Follow-Up Instructions: Return if symptoms worsen or fail to improve, for Osteoarthritis, psoriasis, myofascial pain.  I advised her to contact me if she develops any new symptoms.  Otherwise she will be returning on a as needed basis.   Bo Merino, MD  Note - This record has been created using Editor, commissioning.  Chart creation errors have been sought, but may not always  have been located. Such creation errors do not reflect on  the standard of medical care.

## 2021-07-23 NOTE — Progress Notes (Signed)
Sed rate is normal

## 2021-07-24 ENCOUNTER — Other Ambulatory Visit (INDEPENDENT_AMBULATORY_CARE_PROVIDER_SITE_OTHER): Payer: Self-pay | Admitting: Bariatrics

## 2021-07-24 DIAGNOSIS — E559 Vitamin D deficiency, unspecified: Secondary | ICD-10-CM

## 2021-07-27 ENCOUNTER — Ambulatory Visit (INDEPENDENT_AMBULATORY_CARE_PROVIDER_SITE_OTHER): Payer: 59 | Admitting: Bariatrics

## 2021-08-03 ENCOUNTER — Other Ambulatory Visit (HOSPITAL_COMMUNITY): Payer: Self-pay

## 2021-08-03 ENCOUNTER — Encounter (INDEPENDENT_AMBULATORY_CARE_PROVIDER_SITE_OTHER): Payer: Self-pay | Admitting: Bariatrics

## 2021-08-03 ENCOUNTER — Ambulatory Visit (INDEPENDENT_AMBULATORY_CARE_PROVIDER_SITE_OTHER): Payer: 59 | Admitting: Bariatrics

## 2021-08-03 VITALS — BP 106/73 | HR 77 | Temp 97.9°F | Ht 66.0 in | Wt 172.0 lb

## 2021-08-03 DIAGNOSIS — R632 Polyphagia: Secondary | ICD-10-CM | POA: Diagnosis not present

## 2021-08-03 DIAGNOSIS — Z6827 Body mass index (BMI) 27.0-27.9, adult: Secondary | ICD-10-CM

## 2021-08-03 DIAGNOSIS — E669 Obesity, unspecified: Secondary | ICD-10-CM

## 2021-08-03 DIAGNOSIS — E8881 Metabolic syndrome: Secondary | ICD-10-CM | POA: Diagnosis not present

## 2021-08-03 MED ORDER — WEGOVY 1 MG/0.5ML ~~LOC~~ SOAJ
1.0000 mg | SUBCUTANEOUS | 0 refills | Status: DC
  Filled 2021-08-03: qty 2, 28d supply, fill #0

## 2021-08-03 MED ORDER — PHENTERMINE HCL 15 MG PO CAPS
15.0000 mg | ORAL_CAPSULE | Freq: Every day | ORAL | 0 refills | Status: DC
  Filled 2021-08-03 – 2021-08-05 (×2): qty 30, 30d supply, fill #0

## 2021-08-05 ENCOUNTER — Other Ambulatory Visit (HOSPITAL_COMMUNITY): Payer: Self-pay

## 2021-08-06 ENCOUNTER — Ambulatory Visit: Payer: 59 | Admitting: Rheumatology

## 2021-08-06 ENCOUNTER — Other Ambulatory Visit (HOSPITAL_COMMUNITY): Payer: Self-pay

## 2021-08-06 ENCOUNTER — Encounter: Payer: Self-pay | Admitting: Rheumatology

## 2021-08-06 VITALS — BP 108/76 | HR 80 | Ht 66.0 in | Wt 175.6 lb

## 2021-08-06 DIAGNOSIS — M62838 Other muscle spasm: Secondary | ICD-10-CM | POA: Diagnosis not present

## 2021-08-06 DIAGNOSIS — M7918 Myalgia, other site: Secondary | ICD-10-CM

## 2021-08-06 DIAGNOSIS — N921 Excessive and frequent menstruation with irregular cycle: Secondary | ICD-10-CM

## 2021-08-06 DIAGNOSIS — M533 Sacrococcygeal disorders, not elsewhere classified: Secondary | ICD-10-CM | POA: Diagnosis not present

## 2021-08-06 DIAGNOSIS — E538 Deficiency of other specified B group vitamins: Secondary | ICD-10-CM

## 2021-08-06 DIAGNOSIS — Z840 Family history of diseases of the skin and subcutaneous tissue: Secondary | ICD-10-CM

## 2021-08-06 DIAGNOSIS — G8929 Other chronic pain: Secondary | ICD-10-CM

## 2021-08-06 DIAGNOSIS — Z1589 Genetic susceptibility to other disease: Secondary | ICD-10-CM

## 2021-08-06 DIAGNOSIS — E611 Iron deficiency: Secondary | ICD-10-CM

## 2021-08-06 DIAGNOSIS — M249 Joint derangement, unspecified: Secondary | ICD-10-CM

## 2021-08-06 DIAGNOSIS — R768 Other specified abnormal immunological findings in serum: Secondary | ICD-10-CM

## 2021-08-06 DIAGNOSIS — E559 Vitamin D deficiency, unspecified: Secondary | ICD-10-CM

## 2021-08-06 DIAGNOSIS — Z8669 Personal history of other diseases of the nervous system and sense organs: Secondary | ICD-10-CM

## 2021-08-06 DIAGNOSIS — M1711 Unilateral primary osteoarthritis, right knee: Secondary | ICD-10-CM | POA: Diagnosis not present

## 2021-08-06 DIAGNOSIS — M5136 Other intervertebral disc degeneration, lumbar region: Secondary | ICD-10-CM | POA: Diagnosis not present

## 2021-08-06 DIAGNOSIS — J3089 Other allergic rhinitis: Secondary | ICD-10-CM

## 2021-08-06 DIAGNOSIS — L409 Psoriasis, unspecified: Secondary | ICD-10-CM

## 2021-08-06 NOTE — Patient Instructions (Signed)
Back Exercises The following exercises strengthen the muscles that help to support the trunk (torso) and back. They also help to keep the lower back flexible. Doing these exercises can help to prevent or lessen existing low back pain. If you have back pain or discomfort, try doing these exercises 2-3 times each day or as told by your health care provider. As your pain improves, do them once each day, but increase the number of times that you repeat the steps for each exercise (do more repetitions). To prevent the recurrence of back pain, continue to do these exercises once each day or as told by your health care provider. Do exercises exactly as told by your health care provider and adjust them as directed. It is normal to feel mild stretching, pulling, tightness, or discomfort as you do these exercises, but you should stop right away if you feel sudden pain or your pain gets worse. Exercises Single knee to chest Repeat these steps 3-5 times for each leg: Lie on your back on a firm bed or the floor with your legs extended. Bring one knee to your chest. Your other leg should stay extended and in contact with the floor. Hold your knee in place by grabbing your knee or thigh with both hands and hold. Pull on your knee until you feel a gentle stretch in your lower back or buttocks. Hold the stretch for 10-30 seconds. Slowly release and straighten your leg.  Pelvic tilt Repeat these steps 5-10 times: Lie on your back on a firm bed or the floor with your legs extended. Bend your knees so they are pointing toward the ceiling and your feet are flat on the floor. Tighten your lower abdominal muscles to press your lower back against the floor. This motion will tilt your pelvis so your tailbone points up toward the ceiling instead of pointing to your feet or the floor. With gentle tension and even breathing, hold this position for 5-10 seconds.  Cat-cow Repeat these steps until your lower back becomes  more flexible: Get into a hands-and-knees position on a firm bed or the floor. Keep your hands under your shoulders, and keep your knees under your hips. You may place padding under your knees for comfort. Let your head hang down toward your chest. Contract your abdominal muscles and point your tailbone toward the floor so your lower back becomes rounded like the back of a cat. Hold this position for 5 seconds. Slowly lift your head, let your abdominal muscles relax, and point your tailbone up toward the ceiling so your back forms a sagging arch like the back of a cow. Hold this position for 5 seconds.  Press-ups Repeat these steps 5-10 times: Lie on your abdomen (face-down) on a firm bed or the floor. Place your palms near your head, about shoulder-width apart. Keeping your back as relaxed as possible and keeping your hips on the floor, slowly straighten your arms to raise the top half of your body and lift your shoulders. Do not use your back muscles to raise your upper torso. You may adjust the placement of your hands to make yourself more comfortable. Hold this position for 5 seconds while you keep your back relaxed. Slowly return to lying flat on the floor.  Bridges Repeat these steps 10 times: Lie on your back on a firm bed or the floor. Bend your knees so they are pointing toward the ceiling and your feet are flat on the floor. Your arms should be flat   at your sides, next to your body. Tighten your buttocks muscles and lift your buttocks off the floor until your waist is at almost the same height as your knees. You should feel the muscles working in your buttocks and the back of your thighs. If you do not feel these muscles, slide your feet 1-2 inches (2.5-5 cm) farther away from your buttocks. Hold this position for 3-5 seconds. Slowly lower your hips to the starting position, and allow your buttocks muscles to relax completely. If this exercise is too easy, try doing it with your arms  crossed over your chest. Abdominal crunches Repeat these steps 5-10 times: Lie on your back on a firm bed or the floor with your legs extended. Bend your knees so they are pointing toward the ceiling and your feet are flat on the floor. Cross your arms over your chest. Tip your chin slightly toward your chest without bending your neck. Tighten your abdominal muscles and slowly raise your torso high enough to lift your shoulder blades a tiny bit off the floor. Avoid raising your torso higher than that because it can put too much stress on your lower back and does not help to strengthen your abdominal muscles. Slowly return to your starting position.  Back lifts Repeat these steps 5-10 times: Lie on your abdomen (face-down) with your arms at your sides, and rest your forehead on the floor. Tighten the muscles in your legs and your buttocks. Slowly lift your chest off the floor while you keep your hips pressed to the floor. Keep the back of your head in line with the curve in your back. Your eyes should be looking at the floor. Hold this position for 3-5 seconds. Slowly return to your starting position.  Contact a health care provider if: Your back pain or discomfort gets much worse when you do an exercise. Your worsening back pain or discomfort does not lessen within 2 hours after you exercise. If you have any of these problems, stop doing these exercises right away. Do not do them again unless your health care provider says that you can. Get help right away if: You develop sudden, severe back pain. If this happens, stop doing the exercises right away. Do not do them again unless your health care provider says that you can. This information is not intended to replace advice given to you by your health care provider. Make sure you discuss any questions you have with your health care provider. Document Revised: 06/24/2020 Document Reviewed: 03/12/2020 [employer] Patient Education  2023 [employer] Knee Exercises Ask your health care provider which exercises are safe for you. Do exercises exactly as told by your health care provider and adjust them as directed. It is normal to feel mild stretching, pulling, tightness, or discomfort as you do these exercises. Stop right away if you feel sudden pain or your pain gets worse. Do not begin these exercises until told by your health care provider. Stretching and range-of-motion exercises These exercises warm up your muscles and joints and improve the movement and flexibility of your knee. These exercises also help to relieve pain and swelling. Knee extension, prone  Lie on your abdomen (prone position) on a bed. Place your left / right knee just beyond the edge of the surface so your knee is not on the bed. You can put a towel under your left / right thigh just above your kneecap for comfort. Relax your leg muscles and allow gravity to straighten your knee (  extension). You should feel a stretch behind your left / right knee. Hold this position for __________ seconds. Scoot up so your knee is supported between repetitions. Repeat __________ times. Complete this exercise __________ times a day. Knee flexion, active  Lie on your back with both legs straight. If this causes back discomfort, bend your left / right knee so your foot is flat on the floor. Slowly slide your left / right heel back toward your buttocks. Stop when you feel a gentle stretch in the front of your knee or thigh (flexion). Hold this position for __________ seconds. Slowly slide your left / right heel back to the starting position. Repeat __________ times. Complete this exercise __________ times a day. Quadriceps stretch, prone  Lie on your abdomen on a firm surface, such as a bed or padded floor. Bend your left / right knee and hold your ankle. If you cannot reach your ankle or pant leg, loop a belt around your foot and grab the belt instead. Gently pull your heel toward  your buttocks. Your knee should not slide out to the side. You should feel a stretch in the front of your thigh and knee (quadriceps). Hold this position for __________ seconds. Repeat __________ times. Complete this exercise __________ times a day. Hamstring, supine  Lie on your back (supine position). Loop a belt or towel over the ball of your left / right foot. The ball of your foot is on the walking surface, right under your toes. Straighten your left / right knee and slowly pull on the belt to raise your leg until you feel a gentle stretch behind your knee (hamstring). Do not let your knee bend while you do this. Keep your other leg flat on the floor. Hold this position for __________ seconds. Repeat __________ times. Complete this exercise __________ times a day. Strengthening exercises These exercises build strength and endurance in your knee. Endurance is the ability to use your muscles for a long time, even after they get tired. Quadriceps, isometric This exercise strengthens the muscles in front of your thigh (quadriceps) without moving your knee joint (isometric). Lie on your back with your left / right leg extended and your other knee bent. Put a rolled towel or small pillow under your knee if told by your health care provider. Slowly tense the muscles in the front of your left / right thigh. You should see your kneecap slide up toward your hip or see increased dimpling just above the knee. This motion will push the back of the knee toward the floor. For __________ seconds, hold the muscle as tight as you can without increasing your pain. Relax the muscles slowly and completely. Repeat __________ times. Complete this exercise __________ times a day. Straight leg raises This exercise strengthens the muscles in front of your thigh (quadriceps) and the muscles that move your hips (hip flexors). Lie on your back with your left / right leg extended and your other knee bent. Tense the  muscles in the front of your left / right thigh. You should see your kneecap slide up or see increased dimpling just above the knee. Your thigh may even shake a bit. Keep these muscles tight as you raise your leg 4-6 inches (10-15 cm) off the floor. Do not let your knee bend. Hold this position for __________ seconds. Keep these muscles tense as you lower your leg. Relax your muscles slowly and completely after each repetition. Repeat __________ times. Complete this exercise __________ times a day.   Hamstring, isometric  Lie on your back on a firm surface. Bend your left / right knee about __________ degrees. Dig your left / right heel into the surface as if you are trying to pull it toward your buttocks. Tighten the muscles in the back of your thighs (hamstring) to "dig" as hard as you can without increasing any pain. Hold this position for __________ seconds. Release the tension gradually and allow your muscles to relax completely for __________ seconds after each repetition. Repeat __________ times. Complete this exercise __________ times a day. Hamstring curls If told by your health care provider, do this exercise while wearing ankle weights. Begin with __________lb / kg weights. Then increase the weight by 1 lb (0.5 kg) increments. Do not wear ankle weights that are more than __________lb / kg. Lie on your abdomen with your legs straight. Bend your left / right knee as far as you can without feeling pain. Keep your hips flat against the floor. Hold this position for __________ seconds. Slowly lower your leg to the starting position. Repeat __________ times. Complete this exercise __________ times a day. Squats This exercise strengthens the muscles in front of your thigh and knee (quadriceps). Stand in front of a table, with your feet and knees pointing straight ahead. You may rest your hands on the table for balance but not for support. Slowly bend your knees and lower your hips like you  are going to sit in a chair. Keep your weight over your heels, not over your toes. Keep your lower legs upright so they are parallel with the table legs. Do not let your hips go lower than your knees. Do not bend lower than told by your health care provider. If your knee pain increases, do not bend as low. Hold the squat position for __________ seconds. Slowly push with your legs to return to standing. Do not use your hands to pull yourself to standing. Repeat __________ times. Complete this exercise __________ times a day. Wall slides This exercise strengthens the muscles in front of your thigh and knee (quadriceps). Lean your back against a smooth wall or door, and walk your feet out 18-24 inches (46-61 cm) from it. Place your feet hip-width apart. Slowly slide down the wall or door until your knees bend __________ degrees. Keep your knees over your heels, not over your toes. Keep your knees in line with your hips. Hold this position for __________ seconds. Repeat __________ times. Complete this exercise __________ times a day. Straight leg raises, side-lying This exercise strengthens the muscles that rotate the leg at the hip and move it away from your body (hip abductors). Lie on your side with your left / right leg in the top position. Lie so your head, shoulder, knee, and hip line up. You may bend your bottom knee to help you keep your balance. Roll your hips slightly forward so your hips are stacked directly over each other and your left / right knee is facing forward. Leading with your heel, lift your top leg 4-6 inches (10-15 cm). You should feel the muscles in your outer hip lifting. Do not let your foot drift forward. Do not let your knee roll toward the ceiling. Hold this position for __________ seconds. Slowly return your leg to the starting position. Let your muscles relax completely after each repetition. Repeat __________ times. Complete this exercise __________ times a  day. Straight leg raises, prone This exercise stretches the muscles that move your hips away from the front   of the pelvis (hip extensors). Lie on your abdomen on a firm surface. You can put a pillow under your hips if that is more comfortable. Tense the muscles in your buttocks and lift your left / right leg about 4-6 inches (10-15 cm). Keep your knee straight as you lift your leg. Hold this position for __________ seconds. Slowly lower your leg to the starting position. Let your leg relax completely after each repetition. Repeat __________ times. Complete this exercise __________ times a day. This information is not intended to replace advice given to you by your health care provider. Make sure you discuss any questions you have with your health care provider. Document Revised: 09/09/2020 Document Reviewed: 09/09/2020 Else[employer] Patient Education  2023 [employer]  

## 2021-08-10 ENCOUNTER — Encounter (INDEPENDENT_AMBULATORY_CARE_PROVIDER_SITE_OTHER): Payer: Self-pay | Admitting: Bariatrics

## 2021-08-10 NOTE — Progress Notes (Signed)
Chief Complaint:   OBESITY Sherri Cobb is here to discuss her progress with her obesity treatment plan along with follow-up of her obesity related diagnoses. Sherri Cobb is on keeping a food journal and adhering to recommended goals of 1250 calories and 80 grams of protein daily and states she is following her eating plan approximately 80% of the time. Sherri Cobb states she is doing yoga, walking , and bike riding for 60 minutes 6 times per week.  Today's visit was #: 13 Starting weight: 188 lbs Starting date: 11/13/2020 Today's weight: 172 lbs Today's date: 08/03/2021 Total lbs lost to date: 16 Total lbs lost since last in-office visit: 0  Interim History: Sherri Cobb is up 2 pounds since her last visit.  She has been to the beach, but she is exercising.  She is doing well with her water intake.  Subjective:   1. Polyphagia Sherri Cobb is taking Wegovy and phentermine as prescribed.   2. Insulin resistance Sherri Cobb is taking Wegovy and phentermine as prescribed.   Assessment/Plan:   1. Polyphagia Sherri Cobb agreed to increase Wegovy from 0.5 mg to 1 mg once weekly, and we will refill for 1 month.  - Semaglutide-Weight Management (WEGOVY) 1 MG/0.5ML SOAJ; Inject 1 mg into the skin once a week.  Dispense: 2 mL; Refill: 0  2. Insulin resistance Sherri Cobb will continue her medications, and she will minimize all carbohydrates (sugar and starch).  3. Obesity, Current BMI 27.8 Sherri Cobb is currently in the action stage of change. As such, her goal is to continue with weight loss efforts. She has agreed to keeping a food journal and adhering to recommended goals of 1250 calories and 80 grams of protein daily.   She will continue to adhere closely to the plan.  Reviewed PDMP.  We discussed various medication options to help Sherri Cobb with her weight loss efforts and we both agreed to continue phentermine 15 mg daily with lunch, and we will refill for 1 month.  - phentermine 15 MG capsule; Take 1 capsule (15  mg total) by mouth daily with lunch.  Dispense: 30 capsule; Refill: 0  Exercise goals: As is.   Behavioral modification strategies: increasing lean protein intake, decreasing simple carbohydrates, increasing vegetables, increasing water intake, decreasing eating out, no skipping meals, meal planning and cooking strategies, keeping healthy foods in the home, and planning for success.  Sherri Cobb has agreed to follow-up with our clinic in 3 to 4 weeks. She was informed of the importance of frequent follow-up visits to maximize her success with intensive lifestyle modifications for her multiple health conditions.   Objective:   Blood pressure 106/73, pulse 77, temperature 97.9 F (36.6 C), height 5\' 6"  (1.676 m), weight 172 lb (78 kg), SpO2 96 %. Body mass index is 27.76 kg/m.  General: Cooperative, alert, well developed, in no acute distress. HEENT: Conjunctivae and lids unremarkable. Cardiovascular: Regular rhythm.  Lungs: Normal work of breathing. Neurologic: No focal deficits.   Lab Results  Component Value Date   CREATININE 0.85 06/03/2021   BUN 14 06/03/2021   NA 142 06/03/2021   K 4.5 06/03/2021   CL 105 06/03/2021   CO2 23 06/03/2021   Lab Results  Component Value Date   ALT 9 06/03/2021   AST 10 06/03/2021   ALKPHOS 43 (L) 06/03/2021   BILITOT 0.4 06/03/2021   Lab Results  Component Value Date   HGBA1C 5.1 06/03/2021   HGBA1C 5.1 11/13/2020   Lab Results  Component Value Date   INSULIN 10.9 06/03/2021  INSULIN 13.2 11/13/2020   Lab Results  Component Value Date   TSH 2.150 11/13/2020   Lab Results  Component Value Date   CHOL 179 06/03/2021   HDL 44 06/03/2021   LDLCALC 116 (H) 06/03/2021   TRIG 102 06/03/2021   Lab Results  Component Value Date   VD25OH 45.5 06/03/2021   VD25OH 27.12 (L) 12/19/2020   VD25OH 30.9 11/13/2020   Lab Results  Component Value Date   WBC 8.3 07/12/2020   HGB 13.2 07/12/2020   HCT 39.5 07/12/2020   MCV 88.2 07/12/2020    PLT 279 07/12/2020   Lab Results  Component Value Date   IRON 34 (L) 12/19/2020   TIBC 305 12/19/2020   FERRITIN 50 12/19/2020   Attestation Statements:   Reviewed by clinician on day of visit: allergies, medications, problem list, medical history, surgical history, family history, social history, and previous encounter notes.   Trude Mcburney, am acting as Energy manager for [employer], DO.  I have reviewed the above documentation for accuracy and completeness, and I agree with the above. Corinna Capra, DO

## 2021-08-19 ENCOUNTER — Encounter (INDEPENDENT_AMBULATORY_CARE_PROVIDER_SITE_OTHER): Payer: Self-pay

## 2021-08-24 ENCOUNTER — Encounter: Payer: Self-pay | Admitting: Family Medicine

## 2021-08-25 NOTE — Telephone Encounter (Signed)
These advise patient if we are going to discuss fibromyalgia and medication/treatment, then we will need to have an appointment to initiate management.  It can be a virtual appointment if she would like and has access to virtual platform.

## 2021-08-25 NOTE — Telephone Encounter (Signed)
Please advise if another visit is necessary.

## 2021-09-02 ENCOUNTER — Encounter: Payer: Self-pay | Admitting: Family Medicine

## 2021-09-02 ENCOUNTER — Telehealth (INDEPENDENT_AMBULATORY_CARE_PROVIDER_SITE_OTHER): Payer: 59 | Admitting: Family Medicine

## 2021-09-02 ENCOUNTER — Other Ambulatory Visit (HOSPITAL_COMMUNITY): Payer: Self-pay

## 2021-09-02 DIAGNOSIS — M419 Scoliosis, unspecified: Secondary | ICD-10-CM | POA: Diagnosis not present

## 2021-09-02 DIAGNOSIS — M7918 Myalgia, other site: Secondary | ICD-10-CM

## 2021-09-02 MED ORDER — DULOXETINE HCL 30 MG PO CPEP
30.0000 mg | ORAL_CAPSULE | Freq: Every day | ORAL | 1 refills | Status: DC
Start: 2021-09-02 — End: 2021-12-09
  Filled 2021-09-02: qty 90, 90d supply, fill #0

## 2021-09-02 NOTE — Progress Notes (Unsigned)
VIRTUAL VISIT VIA VIDEO  I connected with Sherri Cobb on 09/03/21 at  1:40 PM EDT by a video enabled telemedicine application and verified that I am speaking with the correct person using two identifiers. Location patient: Home Location provider: [facility], Office Persons participating in the virtual visit: Patient, Dr. Raoul Pitch and Cyndra Numbers, CMA  I discussed the limitations of evaluation and management by telemedicine and the availability of in person appointments. The patient expressed understanding and agreed to proceed.    Sherri Cobb , Sep 11, 1980, 41 y.o., female MRN: [id number] Patient Care Team    Relationship Specialty Notifications Start End  Ma Hillock, DO PCP - General Family Medicine  11/14/20   Georgia Lopes, DO Consulting Physician Bariatrics  11/14/20   Hiram Gash, MD Consulting Physician Orthopedic Surgery  11/14/20   [place] [facility] Nurse Practitioner Gynecology  11/14/20    Comment: Joycelyn Rua, NP    Chief Complaint  Patient presents with   Fibromyalgia     Subjective: Pt presents for an OV to discuss starting a medication for her myofascial pain syndrome.  She recently has had a rheumatological work-up for her discomforts and was found to have an HLA-B27 positivity, mildly elevated ESR, iron deficiency, B12 deficiency, vitamin deficiency and a very weak positive ANA.  Rheumatology evaluated her and did not feel her labs/symptoms reflected a particular autoimmune disorder.  They reports they diagnosed her with fibromyalgia/myofascial pain syndrome.  Patient reports she initially did not want to start medications but feels she needs to take this step to better her health.  She is tired of hurting and tired of being tired.     12/19/2020   10:29 AM 11/13/2020    8:29 AM 09/15/2017    9:55 AM  Depression screen PHQ 2/9  Decreased Interest 0 2 0  Down, Depressed, Hopeless 0 1 0  PHQ - 2 Score 0 3 0  Altered sleeping  2 0  Tired,  decreased energy  3 3  Change in appetite  1 0  Feeling bad or failure about yourself   2 0  Trouble concentrating  1 0  Moving slowly or fidgety/restless  0 0  Suicidal thoughts  0 0  PHQ-9 Score  12 3  Difficult doing work/chores  Somewhat difficult     No Known Allergies Social History   Social History Narrative   Marital status/children/pets: Single/Div., 2 daughters   Education/employment: associates degree, administrative work   Engineer, materials:      -smoke alarm in the home:Yes     - wears seatbelt: Yes     - Feels safe in their relationships: Yes      Past Medical History:  Diagnosis Date   Allergies    Allergy    Back pain    Chicken pox    Fatty liver    Gallbladder problem    Hx of vaginitis 12/10/2017   Recurrent yeast with Mirena IUD Reported history of uterine infection, but unable to find that in her Care Everywhere chart.   Lactose intolerance    Menometrorrhagia    Before BCPs   Migraines    Psoriasis    Past Surgical History:  Procedure Laterality Date   CHOLECYSTECTOMY N/A 07/12/2020   Procedure: LAPAROSCOPIC CHOLECYSTECTOMY;  Surgeon: Ileana Roup, MD;  Location: [facility];  Service: General;  Laterality: N/A;   KNEE ARTHROSCOPY WITH LATERAL RELEASE Right 12/27/2019   Procedure: KNEE ARTHROSCOPY  WITH LATERAL RELEASE; REMOVAL LOOSE FOREIGN BODY; DEBRIDEMENT/SHAVING CHONDROPLASTY;  Surgeon: Hiram Gash, MD;  Location: [place];  Service: Orthopedics;  Laterality: Right;   LASIK Bilateral 2016   TIBIAL TUBERCLERPLASTY Right 12/27/2019   Procedure: LIGAMENT RECONSTRUCTION KNEE EXTRA-ARTICULAR, ANTERIOR TIBIAL TUBERCLEPLASTY, DECOMPRESSION FASCIOTOMY, LEG ANTERIOR;  Surgeon: Hiram Gash, MD;  Location: [place];  Service: Orthopedics;  Laterality: Right;   WISDOM TOOTH EXTRACTION     Family History  Problem Relation Age of Onset   Hypertension Mother    Hyperlipidemia Mother    Anxiety disorder Mother    Sleep  apnea Mother    Thyroid disease Father    Depression Father    Gout Father    Kidney Stones Father    Miscarriages / Stillbirths Sister    Hypertension Maternal Grandmother    Depression Maternal Grandmother    Skin cancer Maternal Grandmother    Diverticulitis Maternal Grandmother    Anxiety disorder Daughter    Depression Daughter    ADD / ADHD Daughter    Allergies as of 09/02/2021   No Known Allergies      Medication List        Accurate as of September 02, 2021 11:59 PM. If you have any questions, ask your nurse or doctor.          DULoxetine 30 MG capsule Commonly known as: Cymbalta Take 1 capsule (30 mg total) by mouth daily. Started by: Howard Pouch, DO   fluocinonide ointment 0.05 % Commonly known as: LIDEX Apply 1 application topically 2 (two) times daily. What changed:  when to take this reasons to take this   magnesium 30 MG tablet Take 30 mg by mouth 2 (two) times daily.   multivitamin with minerals Tabs tablet Take 1 tablet by mouth daily.   phentermine 15 MG capsule Take 1 capsule (15 mg total) by mouth daily with lunch.   Turmeric 400 MG Caps Take by mouth.   VITAMIN B-12 PO Take by mouth daily.   Vitamin D (Ergocalciferol) 1.25 MG (50000 UNIT) Caps capsule Commonly known as: DRISDOL Take 1 capsule (50,000 Units total) by mouth every 7 (seven) days.   Wegovy 1 MG/0.5ML Soaj Generic drug: Semaglutide-Weight Management Inject 1 mg into the skin once a week.        All past medical history, surgical history, allergies, family history, immunizations andmedications were updated in the EMR today and reviewed under the history and medication portions of their EMR.     ROS Negative, with the exception of above mentioned in HPI   Objective:  There were no vitals taken for this visit. There is no height or weight on file to calculate BMI. Physical Exam Vitals and nursing note reviewed.  Constitutional:      General: She is not in acute  distress.    Appearance: Normal appearance. She is normal weight. She is not ill-appearing or toxic-appearing.  Eyes:     Extraocular Movements: Extraocular movements intact.     Conjunctiva/sclera: Conjunctivae normal.     Pupils: Pupils are equal, round, and reactive to light.  Neurological:     Mental Status: She is alert and oriented to person, place, and time. Mental status is at baseline.  Psychiatric:        Mood and Affect: Mood normal.        Behavior: Behavior normal.        Thought Content: Thought content normal.  Judgment: Judgment normal.      No results found. No results found. No results found for this or any previous visit (from the past 24 hour(s)).  Assessment/Plan: Sherri Cobb is a 41 y.o. female present for OV for  Myofascial pain syndrome/Scoliosis, unspecified scoliosis type, unspecified spinal region Today we discussed myofascial pain syndrome in more detail. We discussed medications that can help treat MPS and medications that can be used when she is having flares. We also discussed the benefits of OMT treatments and exercise routinely.  She would like to have a referral to sports medicine for repeat eval and treat of OMT. - Ambulatory referral to Sports Medicine Start Cymbalta 30 mg daily Follow-up 8 weeks  Reviewed expectations re: course of current medical issues. Discussed self-management of symptoms. Outlined signs and symptoms indicating need for more acute intervention. Patient verbalized understanding and all questions were answered. Patient received an After-Visit Summary.    Orders Placed This Encounter  Procedures   Ambulatory referral to Sports Medicine   Meds ordered this encounter  Medications   DULoxetine (CYMBALTA) 30 MG capsule    Sig: Take 1 capsule (30 mg total) by mouth daily.    Dispense:  90 capsule    Refill:  1   Referral Orders         Ambulatory referral to Sports Medicine       Note is dictated  utilizing voice recognition software. Although note has been proof read prior to signing, occasional typographical errors still can be missed. If any questions arise, please do not hesitate to call for verification.   electronically signed by:  Howard Pouch, DO  [place]

## 2021-09-02 NOTE — Patient Instructions (Signed)
Myofascial Pain Syndrome and Fibromyalgia ?Myofascial pain syndrome and fibromyalgia are both pain disorders. You may feel this pain mainly in your muscles. ?Myofascial pain syndrome: ?Always has tender points in the muscles that will cause pain when pressed (trigger points). The pain may come and go. ?Usually affects your neck, upper back, and shoulder areas. The pain often moves into your arms and hands. ?Fibromyalgia: ?Has muscle pains and tenderness that come and go. ?Is often associated with tiredness (fatigue) and sleep problems. ?Has trigger points. ?Tends to be long-lasting (chronic), but is not life-threatening. ?Fibromyalgia and myofascial pain syndrome are not the same. However, they often occur together. If you have both conditions, each can make the other worse. Both are common and can cause enough pain and fatigue to make day-to-day activities difficult. Both can be hard to diagnose because their symptoms are common in many other conditions. ?What are the causes? ?The exact causes of these conditions are not known. ?What increases the risk? ?You are more likely to develop either of these conditions if: ?You have a family history of the condition. ?You are female. ?You have certain triggers, such as: ?Spine disorders. ?An injury (trauma) or other physical stressors. ?Being under a lot of stress. ?Medical conditions such as osteoarthritis, rheumatoid arthritis, or lupus. ?What are the signs or symptoms? ?Fibromyalgia ?The main symptom of fibromyalgia is widespread pain and tenderness in your muscles. Pain is sometimes described as stabbing, shooting, or burning. ?You may also have: ?Tingling or numbness. ?Sleep problems and fatigue. ?Problems with attention and concentration (fibro fog). ?Other symptoms may include: ?Bowel and bladder problems. ?Headaches. ?Vision problems. ?Sensitivity to odors and noises. ?Depression or mood changes. ?Painful menstrual periods (dysmenorrhea). ?Dry skin or eyes. ?These  symptoms can vary over time. ?Myofascial pain syndrome ?Symptoms of myofascial pain syndrome include: ?Tight, ropy bands of muscle. ?Uncomfortable sensations in muscle areas. These may include aching, cramping, burning, numbness, tingling, and weakness. ?Difficulty moving certain parts of the body freely (poor range of motion). ?How is this diagnosed? ?This condition may be diagnosed by your symptoms and medical history. You will also have a physical exam. In general: ?Fibromyalgia is diagnosed if you have pain, fatigue, and other symptoms for more than 3 months, and symptoms cannot be explained by another condition. ?Myofascial pain syndrome is diagnosed if you have trigger points in your muscles, and those trigger points are tender and cause pain elsewhere in your body (referred pain). ?How is this treated? ?Treatment for these conditions depends on the type that you have. ?For fibromyalgia a healthy lifestyle is the most important treatment including aerobic and strength exercises. Different types of medicines are used to help treat pain and include: ?NSAIDs. ?Medicines for treating depression. ?Medicines that help control seizures. ?Medicines that relax the muscles. ?Treatment for myofascial pain syndrome includes: ?Pain medicines, such as NSAIDs. ?Cooling and stretching of muscles. ?Massage therapy with myofascial release technique. ?Trigger point injections. ?Treating these conditions often requires a team of health care providers. These may include: ?Your primary care provider. ?A physical therapist. ?Complementary health care providers, such as massage therapists or acupuncturists. ?A psychiatrist for cognitive behavioral therapy. ?Follow these instructions at home: ?Medicines ?Take over-the-counter and prescription medicines only as told by your health care provider. ?Ask your health care provider if the medicine prescribed to you: ?Requires you to avoid driving or using machinery. ?Can cause constipation.  You may need to take these actions to prevent or treat constipation: ?Drink enough fluid to keep your urine pale   yellow. ?Take over-the-counter or prescription medicines. ?Eat foods that are high in fiber, such as beans, whole grains, and fresh fruits and vegetables. ?Limit foods that are high in fat and processed sugars, such as fried or sweet foods. ?Lifestyle ? ?Do exercises as told by your health care provider or physical therapist. ?Practice relaxation techniques to control your stress. You may want to try: ?Biofeedback. ?Visual imagery. ?Hypnosis. ?Muscle relaxation. ?Yoga. ?Meditation. ?Maintain a healthy lifestyle. This includes eating a healthy diet and getting enough sleep. ?Do not use any products that contain nicotine or tobacco. These products include cigarettes, chewing tobacco, and vaping devices, such as e-cigarettes. If you need help quitting, ask your health care provider. ?General instructions ?Talk to your health care provider about complementary treatments, such as acupuncture or massage. ?Do not do activities that stress or strain your muscles. This includes repetitive motions and heavy lifting. ?Keep all follow-up visits. This is important. ?Where to find support ?Consider joining a support group with others who are diagnosed with this condition. ?National Fibromyalgia Association: www.fmaware.org ?Where to find more information ?American Chronic Pain Association: www.theacpa.org ?Contact a health care provider if: ?You have new symptoms. ?Your symptoms get worse or your pain is severe. ?You have side effects from your medicines. ?You have trouble sleeping. ?Your condition is causing depression or anxiety. ?Get help right away if: ?You have thoughts of hurting yourself or others. ?Get help right awayif you feel like you may hurt yourself or others, or have thoughts about taking your own life. Go to your nearest emergency room or: ?Call 911. ?Call the National Suicide Prevention Lifeline at  1-800-273-8255 or 988. This is open 24 hours a day. ?Text the Crisis Text Line at 741741. ?Summary ?Myofascial pain syndrome and fibromyalgia are pain disorders. ?Myofascial pain syndrome has tender points in the muscles that will cause pain when pressed (trigger points). Fibromyalgia also has muscle pains and tenderness that come and go, but this condition is often associated with fatigue and sleep disturbances. ?Fibromyalgia and myofascial pain syndrome are not the same but often occur together, causing pain and fatigue that make day-to-day activities difficult. ?Follow your health care provider's instructions for taking medicines and maintaining a healthy lifestyle. ?This information is not intended to replace advice given to you by your health care provider. Make sure you discuss any questions you have with your health care provider. ?Document Revised: 11/28/2020 Document Reviewed: 11/28/2020 ?Elsevier Patient Education ? 2023 Elsevier Inc. ? ?

## 2021-09-03 ENCOUNTER — Other Ambulatory Visit (HOSPITAL_COMMUNITY): Payer: Self-pay

## 2021-09-03 ENCOUNTER — Encounter: Payer: Self-pay | Admitting: Family Medicine

## 2021-09-07 ENCOUNTER — Other Ambulatory Visit (HOSPITAL_COMMUNITY): Payer: Self-pay

## 2021-09-07 ENCOUNTER — Encounter (INDEPENDENT_AMBULATORY_CARE_PROVIDER_SITE_OTHER): Payer: Self-pay | Admitting: Bariatrics

## 2021-09-07 ENCOUNTER — Ambulatory Visit (INDEPENDENT_AMBULATORY_CARE_PROVIDER_SITE_OTHER): Payer: 59 | Admitting: Bariatrics

## 2021-09-07 VITALS — BP 99/70 | HR 80 | Temp 98.2°F | Ht 66.0 in | Wt 166.0 lb

## 2021-09-07 DIAGNOSIS — R632 Polyphagia: Secondary | ICD-10-CM | POA: Insufficient documentation

## 2021-09-07 DIAGNOSIS — E559 Vitamin D deficiency, unspecified: Secondary | ICD-10-CM

## 2021-09-07 DIAGNOSIS — E669 Obesity, unspecified: Secondary | ICD-10-CM | POA: Diagnosis not present

## 2021-09-07 DIAGNOSIS — Z6826 Body mass index (BMI) 26.0-26.9, adult: Secondary | ICD-10-CM

## 2021-09-07 MED ORDER — PHENTERMINE HCL 15 MG PO CAPS
15.0000 mg | ORAL_CAPSULE | Freq: Every day | ORAL | 0 refills | Status: DC
  Filled 2021-09-07: qty 30, 30d supply, fill #0

## 2021-09-07 MED ORDER — VITAMIN D (ERGOCALCIFEROL) 1.25 MG (50000 UNIT) PO CAPS
50000.0000 [IU] | ORAL_CAPSULE | ORAL | 0 refills | Status: DC
  Filled 2021-09-07: qty 4, 28d supply, fill #0

## 2021-09-07 MED ORDER — WEGOVY 1 MG/0.5ML ~~LOC~~ SOAJ
1.0000 mg | SUBCUTANEOUS | 0 refills | Status: DC
  Filled 2021-09-07: qty 2, 28d supply, fill #0

## 2021-09-08 NOTE — Progress Notes (Unsigned)
Chief Complaint:   OBESITY Sherri Cobb is here to discuss her progress with her obesity treatment plan along with follow-up of her obesity related diagnoses. Sherri Cobb is on keeping a food journal and adhering to recommended goals of 1250 calories and 80 grams of protein daily and states she is following her eating plan approximately 90% of the time. Sherri Cobb states she is doing yoga for 30-45 minutes 2 times per week.  Today's visit was #: 14 Starting weight: 188 lbs Starting date: 11/13/2020 Today's weight: 166 lbs Today's date: 09/07/2021 Total lbs lost to date: 22 Total lbs lost since last in-office visit: 6  Interim History: Sherri Cobb is down 6 lbs since her last visit. She is following the plan 90%. She is doing well with her water and protein.   Subjective:   1. Vitamin D insufficiency Sherri Cobb is taking Vitamin D.   2. Polyphagia Sherri Cobb is taking Wegovy with no side effects.   Assessment/Plan:   1. Vitamin D insufficiency We will refill prescription Vitamin D 50,000 IU once weekly for 1 month.  - Vitamin D, Ergocalciferol, (DRISDOL) 1.25 MG (50000 UNIT) CAPS capsule; Take 1 capsule (50,000 Units total) by mouth every 7 (seven) days.  Dispense: 4 capsule; Refill: 0  2. Polyphagia We will refill Wegovy 1 mg once weekly for 1 month.  - Semaglutide-Weight Management (WEGOVY) 1 MG/0.5ML SOAJ; Inject 1 mg into the skin once a week.  Dispense: 2 mL; Refill: 0  3. Obesity, Current BMI 26.8 Sherri Cobb is currently in the action stage of change. As such, her goal is to continue with weight loss efforts. She has agreed to keeping a food journal and adhering to recommended goals of 1250 calories and 80 grams of protein daily.   Meal planning and intentional eating were discussed.   We discussed various medication options to help Sherri Cobb with her weight loss efforts and we both agreed to continue phentermine 15 mg once daily, and we will refill for 1 month.  - phentermine 15 MG  capsule; Take 1 capsule (15 mg total) by mouth daily with lunch.  Dispense: 30 capsule; Refill: 0  Exercise goals: As is.   Behavioral modification strategies: increasing lean protein intake, decreasing simple carbohydrates, increasing vegetables, increasing water intake, decreasing eating out, no skipping meals, meal planning and cooking strategies, keeping healthy foods in the home, and planning for success.  Sherri Cobb has agreed to follow-up with our clinic in 4 weeks with William Hamburger, NP. She was informed of the importance of frequent follow-up visits to maximize her success with intensive lifestyle modifications for her multiple health conditions.   Objective:   Blood pressure 99/70, pulse 80, temperature 98.2 F (36.8 C), height 5\' 6"  (1.676 m), weight 166 lb (75.3 kg), SpO2 97 %. Body mass index is 26.79 kg/m.  General: Cooperative, alert, well developed, in no acute distress. HEENT: Conjunctivae and lids unremarkable. Cardiovascular: Regular rhythm.  Lungs: Normal work of breathing. Neurologic: No focal deficits.   Lab Results  Component Value Date   CREATININE 0.85 06/03/2021   BUN 14 06/03/2021   NA 142 06/03/2021   K 4.5 06/03/2021   CL 105 06/03/2021   CO2 23 06/03/2021   Lab Results  Component Value Date   ALT 9 06/03/2021   AST 10 06/03/2021   ALKPHOS 43 (L) 06/03/2021   BILITOT 0.4 06/03/2021   Lab Results  Component Value Date   HGBA1C 5.1 06/03/2021   HGBA1C 5.1 11/13/2020   Lab Results  Component  Value Date   INSULIN 10.9 06/03/2021   INSULIN 13.2 11/13/2020   Lab Results  Component Value Date   TSH 2.150 11/13/2020   Lab Results  Component Value Date   CHOL 179 06/03/2021   HDL 44 06/03/2021   LDLCALC 116 (H) 06/03/2021   TRIG 102 06/03/2021   Lab Results  Component Value Date   VD25OH 45.5 06/03/2021   VD25OH 27.12 (L) 12/19/2020   VD25OH 30.9 11/13/2020   Lab Results  Component Value Date   WBC 8.3 07/12/2020   HGB 13.2 07/12/2020    HCT 39.5 07/12/2020   MCV 88.2 07/12/2020   PLT 279 07/12/2020   Lab Results  Component Value Date   IRON 34 (L) 12/19/2020   TIBC 305 12/19/2020   FERRITIN 50 12/19/2020   Attestation Statements:   Reviewed by clinician on day of visit: allergies, medications, problem list, medical history, surgical history, family history, social history, and previous encounter notes.   Trude Mcburney, am acting as Energy manager for [employer], DO.  I have reviewed the above documentation for accuracy and completeness, and I agree with the above. Corinna Capra, DO

## 2021-09-09 ENCOUNTER — Encounter (INDEPENDENT_AMBULATORY_CARE_PROVIDER_SITE_OTHER): Payer: Self-pay | Admitting: Bariatrics

## 2021-09-16 ENCOUNTER — Other Ambulatory Visit (HOSPITAL_COMMUNITY): Payer: Self-pay

## 2021-09-17 ENCOUNTER — Other Ambulatory Visit (HOSPITAL_COMMUNITY): Payer: Self-pay

## 2021-10-05 ENCOUNTER — Encounter (INDEPENDENT_AMBULATORY_CARE_PROVIDER_SITE_OTHER): Payer: Self-pay | Admitting: Adult Health

## 2021-10-05 ENCOUNTER — Other Ambulatory Visit (HOSPITAL_COMMUNITY): Payer: Self-pay

## 2021-10-05 ENCOUNTER — Ambulatory Visit (INDEPENDENT_AMBULATORY_CARE_PROVIDER_SITE_OTHER): Payer: 59 | Admitting: Adult Health

## 2021-10-05 DIAGNOSIS — R632 Polyphagia: Secondary | ICD-10-CM | POA: Diagnosis not present

## 2021-10-05 DIAGNOSIS — Z6826 Body mass index (BMI) 26.0-26.9, adult: Secondary | ICD-10-CM

## 2021-10-05 DIAGNOSIS — E669 Obesity, unspecified: Secondary | ICD-10-CM

## 2021-10-05 MED ORDER — WEGOVY 1 MG/0.5ML ~~LOC~~ SOAJ
1.0000 mg | SUBCUTANEOUS | 0 refills | Status: DC
  Filled 2021-10-05 – 2021-11-03 (×3): qty 2, 28d supply, fill #0

## 2021-10-07 NOTE — Progress Notes (Signed)
Chief Complaint:   OBESITY Sherri Cobb is here to discuss her progress with her obesity treatment plan along with follow-up of her obesity related diagnoses. Sherri Cobb is on keeping a food journal and adhering to recommended goals of 1250 calories and 80 protein and states she is following her eating plan approximately 80% of the time. Sherri Cobb states she is piliates, yoga, walking 30 minutes 3 times per week.  Today's visit was #: 15 Starting weight: 188 lbs Starting date: 11/13/2020 Today's weight: 162 lbs Today's date: 10/05/2021 Total lbs lost to date: 26 lbs Total lbs lost since last in-office visit: 4 lbs  Interim History:  11/27/20-Started on Phentermine  02/16/21-Started on [facility] 02/16/2021.   Sherri Cobb reports that she often stays within calorie range, however she has been challenged to hit at least 80 grams protein per day.    Subjective:   1. Polyphagia She is down 4 lbs since last office visit.  Blood pressure is at goal.   PDMP verified, no aberrances noted.   Discussed risks and benefits of phentermine therapy, agreeable to stopping.   Assessment/Plan:   1. Polyphagia Discontinue phentermine.   Continue Semaglutide-Weight Management (WEGOVY) 1 MG/0.5ML SOAJ; Inject 1 mg into the skin once a week.  Dispense: 2 mL; Refill: 0  2. Obesity, Current BMI 26.3 Handout:  protein content of food.   Continue Semaglutide-Weight Management (WEGOVY) 1 MG/0.5ML SOAJ; Inject 1 mg into the skin once a week.  Dispense: 2 mL; Refill: 0  Sherri Cobb is currently in the action stage of change. As such, her goal is to continue with weight loss efforts. She has agreed to keeping a food journal and adhering to recommended goals of 1250 calories and 80 protein.   Exercise goals:  As is.   Behavioral modification strategies: increasing lean protein intake, decreasing simple carbohydrates, meal planning and cooking strategies, keeping healthy foods in the home, and planning for  success.  Sherri Cobb has agreed to follow-up with our clinic in 4 weeks. She was informed of the importance of frequent follow-up visits to maximize her success with intensive lifestyle modifications for her multiple health conditions.   Objective:   Blood pressure 110/73, pulse 84, temperature 98 F (36.7 C), height 5\' 6"  (1.676 m), weight 162 lb (73.5 kg), SpO2 97 %. Body mass index is 26.15 kg/m.  General: Cooperative, alert, well developed, in no acute distress. HEENT: Conjunctivae and lids unremarkable. Cardiovascular: Regular rhythm.  Lungs: Normal work of breathing. Neurologic: No focal deficits.   Lab Results  Component Value Date   CREATININE 0.85 06/03/2021   BUN 14 06/03/2021   NA 142 06/03/2021   K 4.5 06/03/2021   CL 105 06/03/2021   CO2 23 06/03/2021   Lab Results  Component Value Date   ALT 9 06/03/2021   AST 10 06/03/2021   ALKPHOS 43 (L) 06/03/2021   BILITOT 0.4 06/03/2021   Lab Results  Component Value Date   HGBA1C 5.1 06/03/2021   HGBA1C 5.1 11/13/2020   Lab Results  Component Value Date   INSULIN 10.9 06/03/2021   INSULIN 13.2 11/13/2020   Lab Results  Component Value Date   TSH 2.150 11/13/2020   Lab Results  Component Value Date   CHOL 179 06/03/2021   HDL 44 06/03/2021   LDLCALC 116 (H) 06/03/2021   TRIG 102 06/03/2021   Lab Results  Component Value Date   VD25OH 45.5 06/03/2021   VD25OH 27.12 (L) 12/19/2020   VD25OH 30.9 11/13/2020  Lab Results  Component Value Date   WBC 8.3 07/12/2020   HGB 13.2 07/12/2020   HCT 39.5 07/12/2020   MCV 88.2 07/12/2020   PLT 279 07/12/2020   Lab Results  Component Value Date   IRON 34 (L) 12/19/2020   TIBC 305 12/19/2020   FERRITIN 50 12/19/2020    Attestation Statements:   Reviewed by clinician on day of visit: allergies, medications, problem list, medical history, surgical history, family history, social history, and previous encounter notes.  I, Malcolm Metro, RMA, am acting as  Energy manager for William Hamburger, NP.  I have reviewed the above documentation for accuracy and completeness, and I agree with the above. -  Treyon Wymore d. Burdett Pinzon, NP-C

## 2021-10-14 NOTE — Progress Notes (Signed)
[facility] [address] [place] [address] Phone: [phone] Subjective:   [name], am serving as a scribe for Dr. Antoine Primas.  I'm seeing this patient by the request  of:  Kuneff, Renee A, DO  CC: Low back pain  YHC:[id number]  Sherri Cobb is a 41 y.o. female coming in with complaint of back pain. Patient states that she feels sore and fatigued constantly for many years. Patient notes stress in her life especially with 41 yo with some mental health issues.  Patient works sedentary job and wonders if some of her inactivity is causing her symptoms. Has been seen by rheumatology and was diagnosed with fibromyalgia. Problem areas are R knee, lower back and both traps.   Patient had MPFL on R knee 2 years ago by Dr. Everardo Pacific. Continues to have pain, itching, and heat coming from the joint. Saw ortho who is going to remove screws in 4 days.          Past Medical History:  Diagnosis Date   Allergies    Allergy    Back pain    Chicken pox    Fatty liver    Gallbladder problem    Hx of vaginitis 12/10/2017   Recurrent yeast with Mirena IUD Reported history of uterine infection, but unable to find that in her Care Everywhere chart.   Lactose intolerance    Menometrorrhagia    Before BCPs   Migraines    Psoriasis    Past Surgical History:  Procedure Laterality Date   CHOLECYSTECTOMY N/A 07/12/2020   Procedure: LAPAROSCOPIC CHOLECYSTECTOMY;  Surgeon: Andria Meuse, MD;  Location: [facility];  Service: General;  Laterality: N/A;   KNEE ARTHROSCOPY WITH LATERAL RELEASE Right 12/27/2019   Procedure: KNEE ARTHROSCOPY WITH LATERAL RELEASE; REMOVAL LOOSE FOREIGN BODY; DEBRIDEMENT/SHAVING CHONDROPLASTY;  Surgeon: Bjorn Pippin, MD;  Location: [facility];  Service: Orthopedics;  Laterality: Right;   LASIK Bilateral 2016   TIBIAL TUBERCLERPLASTY Right 12/27/2019   Procedure: LIGAMENT RECONSTRUCTION KNEE EXTRA-ARTICULAR,  ANTERIOR TIBIAL TUBERCLEPLASTY, DECOMPRESSION FASCIOTOMY, LEG ANTERIOR;  Surgeon: Bjorn Pippin, MD;  Location: [facility];  Service: Orthopedics;  Laterality: Right;   WISDOM TOOTH EXTRACTION     Social History   Socioeconomic History   Marital status: Domestic Partner    Spouse name: Luisa Hart   Number of children: 2   Years of education: Not on file   Highest education level: Associate degree: occupational, Scientist, product/process development, or vocational program  Occupational History   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  Other Topics Concern   Not on file  Social History Narrative   Marital status/children/pets: Single/Div., 2 daughters   Education/employment: associates degree, administrative work   Field seismologist:      -smoke alarm in the home:Yes     - wears seatbelt: Yes     - Feels safe in their relationships: Yes      Social Determinants of Corporate investment banker Strain: Not on file  Food Insecurity: No Food  Insecurity (09/15/2017)   Hunger Vital Sign    Worried About Running Out of Food in the Last Year: Never true    Ran Out of Food in the Last Year: Never true  Transportation Needs: No Transportation Needs (09/15/2017)   PRAPARE - Hydrologist (Medical): No    Lack of Transportation (Non-Medical): No  Physical Activity: Not on file  Stress: Stress Concern Present (09/15/2017)   [place]    Feeling of Stress : To some extent  Social Connections: Not on file   No Known Allergies Family History  Problem Relation Age of  Onset   Hypertension Mother    Hyperlipidemia Mother    Anxiety disorder Mother    Sleep apnea Mother    Thyroid disease Father    Depression Father    Gout Father    Kidney Stones Father    Miscarriages / Stillbirths Sister    Hypertension Maternal Grandmother    Depression Maternal Grandmother    Skin cancer Maternal Grandmother    Diverticulitis Maternal Grandmother    Anxiety disorder Daughter    Depression Daughter    ADD / ADHD Daughter         Current Outpatient Medications (Hematological):    Cyanocobalamin (VITAMIN B-12 PO), Take by mouth daily.  Current Outpatient Medications (Other):    DULoxetine (CYMBALTA) 30 MG capsule, Take 1 capsule (30 mg total) by mouth daily.   fluocinonide ointment (LIDEX) 1.69 %, Apply 1 application topically 2 (two) times daily. (Patient taking differently: Apply 1 application  topically as needed.)   magnesium 30 MG tablet, Take 30 mg by mouth 2 (two) times daily.   Multiple Vitamin (MULTIVITAMIN WITH MINERALS) TABS tablet, Take 1 tablet by mouth daily.   phentermine 15 MG capsule, Take 1 capsule (15 mg total) by mouth daily with lunch.   Semaglutide-Weight Management (WEGOVY) 1 MG/0.5ML SOAJ, Inject 1 mg into the skin once a week.   Turmeric 400 MG CAPS, Take by mouth.   Vitamin D, Ergocalciferol, (DRISDOL) 1.25 MG (50000 UNIT) CAPS capsule, Take 1 capsule (50,000 Units total) by mouth every 7 (seven) days.   Reviewed prior external information including notes and imaging from  primary care provider As well as notes that were available from care everywhere and other healthcare systems.  Past medical history, social, surgical and family history all reviewed in electronic medical record.  No pertanent information unless stated regarding to the chief complaint.   Review of Systems:  No headache, visual changes, nausea, vomiting, diarrhea, constipation, dizziness, abdominal pain, skin rash, fevers, chills, night sweats, weight loss,  swollen lymph nodes, body aches, joint swelling, chest pain, shortness of breath, mood changes. POSITIVE muscle aches, body aches  Objective  Blood pressure 102/76, pulse 80, height 5\' 6"  (1.676 m), weight 158 lb (71.7 kg), SpO2 98 %.   General: No apparent distress alert and oriented x3 mood and affect normal, dressed appropriately.  HEENT: Pupils equal, extraocular movements intact  Respiratory: Patient's speak in full sentences and does not appear short of breath  Cardiovascular: No lower extremity edema, non tender, no erythema  Musculoskeletal exam shows that patient does have hypermobility noted.  Beighton score of 7.  No sign of any true dislocation but patient has had a dislocation of the knee previously.  Postsurgical changes of the right knee noted with some mild swelling on the anterior lateral aspect just under the incision.  Mild crepitus  of the right knee noted.  Low back does have some tenderness to palpation in the paraspinal musculature.  Seems to be more over the sacroiliac joint.  Osteopathic findings C4 flexed rotated and side bent right C6 flexed rotated and side bent right T3 extended rotated and side bent right inhaled third rib T9 extended rotated and side bent left L2 flexed rotated and side bent right L4 flexed rotated and side bent left Sacrum right on right  [id number]; 15 additional minutes spent for Therapeutic exercises as stated in above notes.  This included exercises focusing on stretching, strengthening, with significant focus on eccentric aspects.   Long term goals include an improvement in range of motion, strength, endurance as well as avoiding reinjury. Patient's frequency would include in 1-2 times a day, 3-5 times a week for a duration of 6-12 weeks. Low back exercises that included:  Pelvic tilt/bracing instruction to focus on control of the pelvic girdle and lower abdominal muscles  Glute strengthening exercises, focusing on proper firing of the glutes  without engaging the low back muscles Proper stretching techniques for maximum relief for the hamstrings, hip flexors, low back and some rotation where tolerated  Proper technique shown and discussed handout in great detail with ATC.  All questions were discussed and answered.     Impression and Recommendations:    The above documentation has been reviewed and is accurate and complete [name], DO

## 2021-10-15 ENCOUNTER — Ambulatory Visit: Payer: 59 | Admitting: Family Medicine

## 2021-10-15 ENCOUNTER — Encounter: Payer: Self-pay | Admitting: Family Medicine

## 2021-10-15 VITALS — BP 102/76 | HR 80 | Ht 66.0 in | Wt 158.0 lb

## 2021-10-15 DIAGNOSIS — M9903 Segmental and somatic dysfunction of lumbar region: Secondary | ICD-10-CM | POA: Diagnosis not present

## 2021-10-15 DIAGNOSIS — M2550 Pain in unspecified joint: Secondary | ICD-10-CM

## 2021-10-15 DIAGNOSIS — M542 Cervicalgia: Secondary | ICD-10-CM | POA: Diagnosis not present

## 2021-10-15 DIAGNOSIS — M9908 Segmental and somatic dysfunction of rib cage: Secondary | ICD-10-CM | POA: Diagnosis not present

## 2021-10-15 DIAGNOSIS — M9901 Segmental and somatic dysfunction of cervical region: Secondary | ICD-10-CM

## 2021-10-15 DIAGNOSIS — M9902 Segmental and somatic dysfunction of thoracic region: Secondary | ICD-10-CM

## 2021-10-15 DIAGNOSIS — M9904 Segmental and somatic dysfunction of sacral region: Secondary | ICD-10-CM

## 2021-10-15 DIAGNOSIS — M545 Low back pain, unspecified: Secondary | ICD-10-CM | POA: Diagnosis not present

## 2021-10-15 NOTE — Patient Instructions (Addendum)
Scapular and SI joint Vit D 4000-5000IU daily K2 165mcg daily for 4 weeks Turmeric 500mg  daily Tart Cherry Exftract 12000mg  at night PT [facility] PT See me in 5-6 weeks

## 2021-10-16 DIAGNOSIS — M2550 Pain in unspecified joint: Secondary | ICD-10-CM | POA: Insufficient documentation

## 2021-10-16 DIAGNOSIS — M9903 Segmental and somatic dysfunction of lumbar region: Secondary | ICD-10-CM | POA: Insufficient documentation

## 2021-10-16 NOTE — Assessment & Plan Note (Signed)
Patient does have hypermobility noted.  I do think that this is contributing.  Discussed the need for more stability and strengthening.  Discussed icing regimen and home exercises.  Core strengthening and hip abductors.  Work with Product/process development scientist but do feel formal physical therapy will be beneficial as well.  Patient is taking some duloxetine at the moment.  If we have any worsening pain consider increasing.  Discussed other over-the-counter supplementations that could be helpful.  Follow-up with me again in 6 to 8 weeks

## 2021-10-16 NOTE — Assessment & Plan Note (Signed)

## 2021-10-27 ENCOUNTER — Encounter (INDEPENDENT_AMBULATORY_CARE_PROVIDER_SITE_OTHER): Payer: Self-pay | Admitting: Adult Health

## 2021-10-27 ENCOUNTER — Other Ambulatory Visit (HOSPITAL_COMMUNITY): Payer: Self-pay

## 2021-10-28 ENCOUNTER — Telehealth (INDEPENDENT_AMBULATORY_CARE_PROVIDER_SITE_OTHER): Payer: Self-pay | Admitting: Adult Health

## 2021-10-28 ENCOUNTER — Encounter (INDEPENDENT_AMBULATORY_CARE_PROVIDER_SITE_OTHER): Payer: Self-pay

## 2021-10-28 NOTE — Telephone Encounter (Signed)
Mina Marble - Prior authorization approved for [employer]. Effective:  10/28/2021 to 10/29/2022. Patient sent approval message via mychart.

## 2021-10-28 NOTE — Telephone Encounter (Signed)
Prior authorization has been started for [facility]. Pharmacy just sent request this morning.  Waiting on a response from the insurance. Thanks!

## 2021-11-03 ENCOUNTER — Other Ambulatory Visit (HOSPITAL_COMMUNITY): Payer: Self-pay

## 2021-11-03 ENCOUNTER — Ambulatory Visit (INDEPENDENT_AMBULATORY_CARE_PROVIDER_SITE_OTHER): Payer: 59 | Admitting: Adult Health

## 2021-11-05 ENCOUNTER — Other Ambulatory Visit (HOSPITAL_COMMUNITY): Payer: Self-pay

## 2021-11-05 ENCOUNTER — Encounter (INDEPENDENT_AMBULATORY_CARE_PROVIDER_SITE_OTHER): Payer: Self-pay | Admitting: Family Medicine

## 2021-11-05 ENCOUNTER — Ambulatory Visit (INDEPENDENT_AMBULATORY_CARE_PROVIDER_SITE_OTHER): Payer: 59 | Admitting: Family Medicine

## 2021-11-05 VITALS — BP 101/68 | HR 93 | Temp 98.3°F | Ht 66.0 in | Wt 160.0 lb

## 2021-11-05 DIAGNOSIS — E559 Vitamin D deficiency, unspecified: Secondary | ICD-10-CM

## 2021-11-05 DIAGNOSIS — R632 Polyphagia: Secondary | ICD-10-CM | POA: Diagnosis not present

## 2021-11-05 DIAGNOSIS — Z6825 Body mass index (BMI) 25.0-25.9, adult: Secondary | ICD-10-CM | POA: Diagnosis not present

## 2021-11-05 DIAGNOSIS — E669 Obesity, unspecified: Secondary | ICD-10-CM | POA: Diagnosis not present

## 2021-11-05 MED ORDER — VITAMIN D (ERGOCALCIFEROL) 1.25 MG (50000 UNIT) PO CAPS
50000.0000 [IU] | ORAL_CAPSULE | ORAL | 0 refills | Status: DC
  Filled 2021-11-05: qty 4, 28d supply, fill #0

## 2021-11-05 MED ORDER — WEGOVY 1 MG/0.5ML ~~LOC~~ SOAJ
1.0000 mg | SUBCUTANEOUS | 0 refills | Status: DC
  Filled 2021-11-05: qty 2, 28d supply, fill #0

## 2021-11-17 NOTE — Progress Notes (Signed)
Chief Complaint:   OBESITY Sherri Cobb is here to discuss her progress with her obesity treatment plan along with follow-up of her obesity related diagnoses. Sherri Cobb is on keeping a food journal and adhering to recommended goals of 1250 calories and 80 grams of protein and states she is following her eating plan approximately 70% of the time. Sherri Cobb states she is walking/piliates/yoga 30 minutes 3 times per week.  Today's visit was #: 14 Starting weight: 188 lbs Starting date: 11/13/2020 Today's weight: 160 lbs Today's date: 11/05/2021 Total lbs lost to date: 28 lbs Total lbs lost since last in-office visit: 2  Interim History: Sherri Cobb's dad was in a motorcycle accident at [facility] and is critically ill. She is under a lot of stress, tearful during office visit. They are in the mist of having to make a decision about him.  Subjective:   1. Polyphagia Sherri Cobb has been off Wegovy 3 weeks now, because can not find it.  2. Vitamin D insufficiency   Assessment/Plan:  No orders of the defined types were placed in this encounter.   Medications Discontinued During This Encounter  Medication Reason   phentermine 15 MG capsule Patient Preference   Vitamin D, Ergocalciferol, (DRISDOL) 1.25 MG (50000 UNIT) CAPS capsule Reorder   Semaglutide-Weight Management (WEGOVY) 1 MG/0.5ML SOAJ Reorder     Meds ordered this encounter  Medications   Semaglutide-Weight Management (WEGOVY) 1 MG/0.5ML SOAJ    Sig: Inject 1 mg into the skin once a week.    Dispense:  2 mL    Refill:  0   Vitamin D, Ergocalciferol, (DRISDOL) 1.25 MG (50000 UNIT) CAPS capsule    Sig: Take 1 capsule (50,000 Units total) by mouth every 7 (seven) days.    Dispense:  4 capsule    Refill:  0     1. Polyphagia We will refill Wegovy 1 mg SubQ once weekly for 1 month with 0 refills.  -Refill Semaglutide-Weight Management (WEGOVY) 1 MG/0.5ML SOAJ; Inject 1 mg into the skin once a week.  Dispense: 2 mL; Refill:  0  2. Vitamin D insufficiency We will refill ergocalciferol 50,000 IU once a week for 1 month with 0 refills. Low Vitamin D level contributes to fatigue and are associated with obesity, breast, and colon cancer. She agrees to continue to take prescription Vitamin D @50 ,000 IU every week and will follow-up for routine testing of Vitamin D, at least 2-3 times per year to avoid over-replacement.   -Refill Vitamin D, Ergocalciferol, (DRISDOL) 1.25 MG (50000 UNIT) CAPS capsule; Take 1 capsule (50,000 Units total) by mouth every 7 (seven) days.  Dispense: 4 capsule; Refill: 0  3. Obesity, Current BMI 25.9 Sherri Cobb is currently in the action stage of change. As such, her goal is to continue with weight loss efforts. She has agreed to keeping a food journal and adhering to recommended goals of 1250 calories and 80 grams of protein.   Exercise goals: As is.  Behavioral modification strategies: planning for success and keeping a strict food journal.  Sherri Cobb has agreed to follow-up with our clinic in 4 weeks. She was informed of the importance of frequent follow-up visits to maximize her success with intensive lifestyle modifications for her multiple health conditions.   Objective:   Blood pressure 101/68, pulse 93, temperature 98.3 F (36.8 C), height 5\' 6"  (1.676 m), weight 160 lb (72.6 kg), last menstrual period 10/25/2021, SpO2 97 %. Body mass index is 25.82 kg/m.  General: Cooperative, alert, well developed,  in no acute distress. HEENT: Conjunctivae and lids unremarkable. Cardiovascular: Regular rhythm.  Lungs: Normal work of breathing. Neurologic: No focal deficits.   Lab Results  Component Value Date   CREATININE 0.85 06/03/2021   BUN 14 06/03/2021   NA 142 06/03/2021   K 4.5 06/03/2021   CL 105 06/03/2021   CO2 23 06/03/2021   Lab Results  Component Value Date   ALT 9 06/03/2021   AST 10 06/03/2021   ALKPHOS 43 (L) 06/03/2021   BILITOT 0.4 06/03/2021   Lab Results   Component Value Date   HGBA1C 5.1 06/03/2021   HGBA1C 5.1 11/13/2020   Lab Results  Component Value Date   INSULIN 10.9 06/03/2021   INSULIN 13.2 11/13/2020   Lab Results  Component Value Date   TSH 2.150 11/13/2020   Lab Results  Component Value Date   CHOL 179 06/03/2021   HDL 44 06/03/2021   LDLCALC 116 (H) 06/03/2021   TRIG 102 06/03/2021   Lab Results  Component Value Date   VD25OH 45.5 06/03/2021   VD25OH 27.12 (L) 12/19/2020   VD25OH 30.9 11/13/2020   Lab Results  Component Value Date   WBC 8.3 07/12/2020   HGB 13.2 07/12/2020   HCT 39.5 07/12/2020   MCV 88.2 07/12/2020   PLT 279 07/12/2020   Lab Results  Component Value Date   IRON 34 (L) 12/19/2020   TIBC 305 12/19/2020   FERRITIN 50 12/19/2020   Attestation Statements:   Reviewed by clinician on day of visit: allergies, medications, problem list, medical history, surgical history, family history, social history, and previous encounter notes.  I, Brendell Tyus, am acting as Location manager for [employer], DO.   I have reviewed the above documentation for accuracy and completeness, and I agree with the above. Marjory Sneddon, D.O.  The [place] was signed into law in 2016 which includes the topic of electronic health records.  This provides immediate access to information in MyChart.  This includes consultation notes, operative notes, office notes, lab results and pathology reports.  If you have any questions about what you read please let us know at your next visit so we can discuss your concerns and take corrective action if need be.  We are right here with you.

## 2021-11-18 ENCOUNTER — Other Ambulatory Visit (HOSPITAL_COMMUNITY): Payer: Self-pay

## 2021-11-26 NOTE — Progress Notes (Deleted)
  [name] [address] [place] [address] Phone: [phone] Subjective:    I'm seeing this patient by the request  of:  Kuneff, Renee A, DO  CC:   [id number]  Sherri Cobb is a 41 y.o. female coming in with complaint of back and neck pain. OMT 10/15/2021. Patient states   Medications patient has been prescribed: None  Taking:         Reviewed prior external information including notes and imaging from previsou exam, outside providers and external EMR if available.   As well as notes that were available from care everywhere and other healthcare systems.  Past medical history, social, surgical and family history all reviewed in electronic medical record.  No pertanent information unless stated regarding to the chief complaint.   Past Medical History:  Diagnosis Date   Allergies    Allergy    Back pain    Chicken pox    Fatty liver    Gallbladder problem    Hx of vaginitis 12/10/2017   Recurrent yeast with Mirena IUD Reported history of uterine infection, but unable to find that in her Care Everywhere chart.   Lactose intolerance    Menometrorrhagia    Before BCPs   Migraines    Psoriasis     No Known Allergies   Review of Systems:  No headache, visual changes, nausea, vomiting, diarrhea, constipation, dizziness, abdominal pain, skin rash, fevers, chills, night sweats, weight loss, swollen lymph nodes, body aches, joint swelling, chest pain, shortness of breath, mood changes. POSITIVE muscle aches  Objective  Last menstrual period 10/25/2021.   General: No apparent distress alert and oriented x3 mood and affect normal, dressed appropriately.  HEENT: Pupils equal, extraocular movements intact  Respiratory: Patient's speak in full sentences and does not appear short of breath  Cardiovascular: No lower extremity edema, non tender, no erythema  Gait MSK:  Back   Osteopathic findings  C2 flexed rotated and side  bent right C6 flexed rotated and side bent left T3 extended rotated and side bent right inhaled rib T9 extended rotated and side bent left L2 flexed rotated and side bent right Sacrum right on right       Assessment and Plan:  No problem-specific Assessment & Plan notes found for this encounter.    Nonallopathic problems  Decision today to treat with OMT was based on Physical Exam  After verbal consent patient was treated with HVLA, ME, FPR techniques in cervical, rib, thoracic, lumbar, and sacral  areas  Patient tolerated the procedure well with improvement in symptoms  Patient given exercises, stretches and lifestyle modifications  See medications in patient instructions if given  Patient will follow up in 4-8 weeks             Note: This dictation was prepared with Dragon dictation along with smaller phrase technology. Any transcriptional errors that result from this process are unintentional.

## 2021-11-27 ENCOUNTER — Ambulatory Visit: Payer: 59 | Admitting: Family Medicine

## 2021-12-09 ENCOUNTER — Encounter (INDEPENDENT_AMBULATORY_CARE_PROVIDER_SITE_OTHER): Payer: Self-pay | Admitting: Family Medicine

## 2021-12-09 ENCOUNTER — Other Ambulatory Visit (HOSPITAL_COMMUNITY): Payer: Self-pay

## 2021-12-09 ENCOUNTER — Ambulatory Visit (INDEPENDENT_AMBULATORY_CARE_PROVIDER_SITE_OTHER): Payer: 59 | Admitting: Family Medicine

## 2021-12-09 VITALS — BP 104/69 | HR 86 | Temp 97.8°F | Ht 66.0 in | Wt 158.0 lb

## 2021-12-09 DIAGNOSIS — E7849 Other hyperlipidemia: Secondary | ICD-10-CM | POA: Diagnosis not present

## 2021-12-09 DIAGNOSIS — E538 Deficiency of other specified B group vitamins: Secondary | ICD-10-CM

## 2021-12-09 DIAGNOSIS — Z6825 Body mass index (BMI) 25.0-25.9, adult: Secondary | ICD-10-CM

## 2021-12-09 DIAGNOSIS — E88819 Insulin resistance, unspecified: Secondary | ICD-10-CM

## 2021-12-09 DIAGNOSIS — F4321 Adjustment disorder with depressed mood: Secondary | ICD-10-CM

## 2021-12-09 DIAGNOSIS — E559 Vitamin D deficiency, unspecified: Secondary | ICD-10-CM

## 2021-12-09 DIAGNOSIS — E7800 Pure hypercholesterolemia, unspecified: Secondary | ICD-10-CM | POA: Insufficient documentation

## 2021-12-09 DIAGNOSIS — E669 Obesity, unspecified: Secondary | ICD-10-CM

## 2021-12-09 HISTORY — DX: Insulin resistance, unspecified: E88.819

## 2021-12-09 MED ORDER — VITAMIN D (ERGOCALCIFEROL) 1.25 MG (50000 UNIT) PO CAPS: 50000.0000 [IU] | ORAL_CAPSULE | ORAL | 0 refills | Status: DC

## 2021-12-09 MED ORDER — WEGOVY 1 MG/0.5ML ~~LOC~~ SOAJ
1.0000 mg | SUBCUTANEOUS | 0 refills | Status: DC
  Filled 2021-12-09 – 2021-12-10 (×2): qty 2, 28d supply, fill #0

## 2021-12-09 MED ORDER — WEGOVY 1 MG/0.5ML ~~LOC~~ SOAJ: 1.0000 mg | SUBCUTANEOUS | 0 refills | Status: DC

## 2021-12-09 MED ORDER — ESCITALOPRAM OXALATE 10 MG PO TABS
10.0000 mg | ORAL_TABLET | Freq: Every day | ORAL | 0 refills | Status: DC
  Filled 2021-12-10: qty 30, 30d supply, fill #0

## 2021-12-09 MED ORDER — VITAMIN D (ERGOCALCIFEROL) 1.25 MG (50000 UNIT) PO CAPS
50000.0000 [IU] | ORAL_CAPSULE | ORAL | 0 refills | Status: DC
  Filled 2021-12-09 – 2021-12-10 (×2): qty 4, 28d supply, fill #0

## 2021-12-10 ENCOUNTER — Other Ambulatory Visit (HOSPITAL_COMMUNITY): Payer: Self-pay

## 2021-12-10 LAB — CBC WITH DIFFERENTIAL/PLATELET
Basophils Absolute: 0 10*3/uL (ref 0.0–0.2)
Basos: 1 %
EOS (ABSOLUTE): 0.1 10*3/uL (ref 0.0–0.4)
Eos: 2 %
Hematocrit: 40.6 % (ref 34.0–46.6)
Hemoglobin: 13.9 g/dL (ref 11.1–15.9)
Immature Grans (Abs): 0 10*3/uL (ref 0.0–0.1)
Immature Granulocytes: 0 %
Lymphocytes Absolute: 2.5 10*3/uL (ref 0.7–3.1)
Lymphs: 40 %
MCH: 29.6 pg (ref 26.6–33.0)
MCHC: 34.2 g/dL (ref 31.5–35.7)
MCV: 86 fL (ref 79–97)
Monocytes Absolute: 0.3 10*3/uL (ref 0.1–0.9)
Monocytes: 5 %
Neutrophils Absolute: 3.4 10*3/uL (ref 1.4–7.0)
Neutrophils: 52 %
Platelets: 266 10*3/uL (ref 150–450)
RBC: 4.7 x10E6/uL (ref 3.77–5.28)
RDW: 12.3 % (ref 11.7–15.4)
WBC: 6.3 10*3/uL (ref 3.4–10.8)

## 2021-12-10 LAB — VITAMIN B12: Vitamin B-12: 391 pg/mL (ref 232–1245)

## 2021-12-10 LAB — CMP14+EGFR
ALT: 11 IU/L (ref 0–32)
AST: 10 IU/L (ref 0–40)
Albumin/Globulin Ratio: 1.6 (ref 1.2–2.2)
Albumin: 4.4 g/dL (ref 3.9–4.9)
Alkaline Phosphatase: 40 IU/L — ABNORMAL LOW (ref 44–121)
BUN/Creatinine Ratio: 16 (ref 9–23)
BUN: 15 mg/dL (ref 6–20)
Bilirubin Total: 0.2 mg/dL (ref 0.0–1.2)
CO2: 24 mmol/L (ref 20–29)
Calcium: 9.6 mg/dL (ref 8.7–10.2)
Chloride: 103 mmol/L (ref 96–106)
Creatinine, Ser: 0.93 mg/dL (ref 0.57–1.00)
Globulin, Total: 2.8 g/dL (ref 1.5–4.5)
Glucose: 96 mg/dL (ref 70–99)
Potassium: 4.8 mmol/L (ref 3.5–5.2)
Sodium: 139 mmol/L (ref 134–144)
Total Protein: 7.2 g/dL (ref 6.0–8.5)
eGFR: 81 mL/min/{1.73_m2} (ref >59)

## 2021-12-10 LAB — LIPID PANEL WITH LDL/HDL RATIO
Cholesterol, Total: 197 mg/dL (ref 100–199)
HDL: 44 mg/dL (ref >39)
LDL Chol Calc (NIH): 129 mg/dL — ABNORMAL HIGH (ref 0–99)
LDL/HDL Ratio: 2.9 ratio (ref 0.0–3.2)
Triglycerides: 136 mg/dL (ref 0–149)
VLDL Cholesterol Cal: 24 mg/dL (ref 5–40)

## 2021-12-10 LAB — INSULIN, RANDOM: INSULIN: 11.4 u[IU]/mL (ref 2.6–24.9)

## 2021-12-10 LAB — HEMOGLOBIN A1C
Est. average glucose Bld gHb Est-mCnc: 97 mg/dL
Hgb A1c MFr Bld: 5 % (ref 4.8–5.6)

## 2021-12-10 LAB — VITAMIN D 25 HYDROXY (VIT D DEFICIENCY, FRACTURES): Vit D, 25-Hydroxy: 46.7 ng/mL (ref 30.0–100.0)

## 2021-12-10 LAB — TSH: TSH: 2.41 u[IU]/mL (ref 0.450–4.500)

## 2021-12-11 ENCOUNTER — Other Ambulatory Visit (HOSPITAL_COMMUNITY): Payer: Self-pay

## 2021-12-21 NOTE — Progress Notes (Unsigned)
Chief Complaint:   OBESITY Sherri Cobb is here to discuss her progress with her obesity treatment plan along with follow-up of her obesity related diagnoses. Sherri Cobb is on keeping a food journal and adhering to recommended goals of 1250 calories and 80 grams of protein and states she is following her eating plan approximately 85% of the time. Sherri Cobb states she is walking and doing yoga for 25 minutes 2 times per week.  Today's visit was #: 45 Starting weight: 188 lbs Starting date: 11/13/2020 Today's weight: 158 lbs Today's date: 12/09/2021 Total lbs lost to date: 30 Total lbs lost since last in-office visit: 2  Interim History: Sherri Cobb has done very well with weight loss even over Thanksgiving. Her hunger is controlled on Wegovy which was  increased to 1 mg approximately 1 month ago.    Subjective:   1. Vitamin D insufficiency Sherri Cobb is on Vitamin D, and she is due for labs. She requests a refill today.   2. B12 deficiency Sherri Cobb is on B12, and she is due for labs. Last level was too low.   3. Hyperlipidemia, pure Sherri Cobb LDL is improving with her diet and weight loss. Her level is due to be rechecked.   4. Insulin resistance Sherri Cobb notes decreased simple carbohydrates and increased exercise. She is due to have labs checked.    5. Grieving Sherri Cobb was on Cymbalta for approximately 1 month for fibromyalgia. Her father died unexpectedly and she is the executor of his Will. She is grieving, stressed, and notes blunted happiness.   Assessment/Plan:   1. Vitamin D insufficiency We will check labs today, and we will refill Vitamin D for 1 month.   - VITAMIN D 25 Hydroxy (Vit-D Deficiency, Fractures) - Vitamin D, Ergocalciferol, (DRISDOL) 1.25 MG (50000 UNIT) CAPS capsule; Take 1 capsule by mouth every 7 (seven) days.  Dispense: 4 capsule; Refill: 0  2. B12 deficiency We will check labs today, and we will follow-up at [place] next visit.   - CBC with  Differential/Platelet - Vitamin B12  3. Hyperlipidemia, pure We will check labs today. See will continue to work on diet, exercise and weight loss efforts. Orders and follow up as documented in patient record.   - Lipid Panel With LDL/HDL Ratio  4. Insulin resistance We will check labs today. Jannah will continue to work on weight loss, exercise, and decreasing simple carbohydrates to help decrease the risk of diabetes. Miette agreed to follow-up with [place] as directed to closely monitor her progress.  - CMP14+EGFR - Hemoglobin A1c - Insulin, random - TSH  5. Grieving Sherri Cobb agreed to discontinue Cymbalta, and start Lexapro 10 mg q AM with no refills.   - escitalopram (LEXAPRO) 10 MG tablet; Take 1 tablet (10 mg total) by mouth daily.  Dispense: 30 tablet; Refill: 0  6. Obesity, Current BMI 25.5 Sherri Cobb is currently in the action stage of change. As such, her goal is to continue with weight loss efforts. She has agreed to keeping a food journal and adhering to recommended goals of 1250 calories and 80+ grams of protein.   Sherri Cobb will continue Wegovy, and we will refill for 1 month.   - Semaglutide-Weight Management (WEGOVY) 1 MG/0.5ML SOAJ; Inject 1 mg into the skin once a week.  Dispense: 2 mL; Refill: 0  Exercise goals: As is.   Behavioral modification strategies: increasing lean protein intake and meal planning and cooking strategies.  Sherri Cobb has agreed to follow-up with our clinic in 3 to 4 weeks.  She was informed of the importance of frequent follow-up visits to maximize her success with intensive lifestyle modifications for her multiple health conditions.   Sherri Cobb was informed we would discuss her lab results at her next visit unless there is a critical issue that needs to be addressed sooner. Sherri Cobb agreed to keep her next visit at the agreed upon time to discuss these results.  Objective:   Blood pressure 104/69, pulse 86, temperature 97.8 F (36.6 C), height 5'  6" (1.676 m), weight 158 lb (71.7 kg), SpO2 98 %. Body mass index is 25.5 kg/m.  General: Cooperative, alert, well developed, in no acute distress. HEENT: Conjunctivae and lids unremarkable. Cardiovascular: Regular rhythm.  Lungs: Normal work of breathing. Neurologic: No focal deficits.   Lab Results  Component Value Date   CREATININE 0.93 12/09/2021   BUN 15 12/09/2021   NA 139 12/09/2021   K 4.8 12/09/2021   CL 103 12/09/2021   CO2 24 12/09/2021   Lab Results  Component Value Date   ALT 11 12/09/2021   AST 10 12/09/2021   ALKPHOS 40 (L) 12/09/2021   BILITOT 0.2 12/09/2021   Lab Results  Component Value Date   HGBA1C 5.0 12/09/2021   HGBA1C 5.1 06/03/2021   HGBA1C 5.1 11/13/2020   Lab Results  Component Value Date   INSULIN 11.4 12/09/2021   INSULIN 10.9 06/03/2021   INSULIN 13.2 11/13/2020   Lab Results  Component Value Date   TSH 2.410 12/09/2021   Lab Results  Component Value Date   CHOL 197 12/09/2021   HDL 44 12/09/2021   LDLCALC 129 (H) 12/09/2021   TRIG 136 12/09/2021   Lab Results  Component Value Date   VD25OH 46.7 12/09/2021   VD25OH 45.5 06/03/2021   VD25OH 27.12 (L) 12/19/2020   Lab Results  Component Value Date   WBC 6.3 12/09/2021   HGB 13.9 12/09/2021   HCT 40.6 12/09/2021   MCV 86 12/09/2021   PLT 266 12/09/2021   Lab Results  Component Value Date   IRON 34 (L) 12/19/2020   TIBC 305 12/19/2020   FERRITIN 50 12/19/2020   Attestation Statements:   Reviewed by clinician on day of visit: allergies, medications, problem list, medical history, surgical history, family history, social history, and previous encounter notes.  Time spent on visit including pre-visit chart review and post-visit care and charting was 45 minutes.   I, Trixie Dredge, am acting as transcriptionist for Dennard Nip, MD.  I have reviewed the above documentation for accuracy and completeness, and I agree with the above. -  ***

## 2021-12-31 NOTE — Progress Notes (Signed)
[facility] [address] [place] [address] Phone: [phone] Subjective:    I'm seeing this patient by the request  of:  Kuneff, Renee A, DO  CC: Back and neck pain follow-up  [id number]  Sherri [facility] is a 41 y.o. female coming in with complaint of back and neck pain. OMT 10/15/2021.  Patient states that she is okay patient did have a recent loss of a family member and has not been able to be working out as regularly.  Medications patient has been prescribed: none          Reviewed prior external information including notes and imaging from previsou exam, outside providers and external EMR if available.   As well as notes that were available from care everywhere and other healthcare systems.  Past medical history, social, surgical and family history all reviewed in electronic medical record.  No pertanent information unless stated regarding to the chief complaint.   Past Medical History:  Diagnosis Date   Allergies    Allergy    Back pain    Chicken pox    Fatty liver    Gallbladder problem    Hx of vaginitis 12/10/2017   Recurrent yeast with Mirena IUD Reported history of uterine infection, but unable to find that in her Care Everywhere chart.   Lactose intolerance    Menometrorrhagia    Before BCPs   Migraines    Psoriasis     No Known Allergies   Review of Systems:  No visual changes, nausea, vomiting, diarrhea, constipation, dizziness, abdominal pain, skin rash, fevers, chills, night sweats, weight loss, swollen lymph nodes,  joint swelling, chest pain, shortness of breath, mood changes. POSITIVE muscle aches, body aches, headache  Objective  Blood pressure 110/80, pulse 78, height 5\' 6"  (1.676 m), weight 159 lb (72.1 kg), SpO2 98 %.   General: No apparent distress alert and oriented x3 mood and affect normal, dressed appropriately.  HEENT: Pupils equal, extraocular movements intact  Respiratory: Patient's speak  in full sentences and does not appear short of breath  Cardiovascular: No lower extremity edema, non tender, no erythema  Hypermobility noted.  The patient does seem to have more tightness in the paraspinal musculature of the lumbar spine.  Negative straight leg test though noted today.  Neck exam does have some mild increase in tightness from previous exam.  Osteopathic findings  C2 flexed rotated and side bent right C5 flexed rotated and side bent left T3 extended rotated and side bent right inhaled rib T9 extended rotated and side bent left L2 flexed rotated and side bent right Sacrum right on right       Assessment and Plan:  Hypermobility arthralgia Hypermobility still likely contributing.  Patient was to do some more strengthening but has not been able to. Patient is having some dull, throbbing aching discomfort.  Patient will start doing more of the exercises hopefully after the new year.  Will follow-up again in 6 to 8 weeks.    Nonallopathic problems  Decision today to treat with OMT was based on Physical Exam  After verbal consent patient was treated with HVLA, ME, FPR techniques in cervical, rib, thoracic, lumbar, and sacral  areas  Patient tolerated the procedure well with improvement in symptoms  Patient given exercises, stretches and lifestyle modifications  See medications in patient instructions if given  Patient will follow up in 4-8 weeks     The above documentation has been reviewed and is  accurate and complete Lyndal Pulley, DO         Note: This dictation was prepared with Dragon dictation along with smaller phrase technology. Any transcriptional errors that result from this process are unintentional.

## 2022-01-06 ENCOUNTER — Encounter (INDEPENDENT_AMBULATORY_CARE_PROVIDER_SITE_OTHER): Payer: Self-pay | Admitting: Physician Assistant

## 2022-01-06 ENCOUNTER — Other Ambulatory Visit (HOSPITAL_COMMUNITY): Payer: Self-pay

## 2022-01-06 ENCOUNTER — Ambulatory Visit (INDEPENDENT_AMBULATORY_CARE_PROVIDER_SITE_OTHER): Payer: 59 | Admitting: Physician Assistant

## 2022-01-06 VITALS — BP 103/70 | HR 54 | Temp 98.1°F | Ht 66.0 in | Wt 157.0 lb

## 2022-01-06 DIAGNOSIS — E7849 Other hyperlipidemia: Secondary | ICD-10-CM

## 2022-01-06 DIAGNOSIS — E88819 Insulin resistance, unspecified: Secondary | ICD-10-CM | POA: Diagnosis not present

## 2022-01-06 DIAGNOSIS — E669 Obesity, unspecified: Secondary | ICD-10-CM

## 2022-01-06 DIAGNOSIS — Z6825 Body mass index (BMI) 25.0-25.9, adult: Secondary | ICD-10-CM

## 2022-01-06 DIAGNOSIS — E559 Vitamin D deficiency, unspecified: Secondary | ICD-10-CM | POA: Diagnosis not present

## 2022-01-06 DIAGNOSIS — F4321 Adjustment disorder with depressed mood: Secondary | ICD-10-CM

## 2022-01-06 MED ORDER — WEGOVY 1 MG/0.5ML ~~LOC~~ SOAJ
1.0000 mg | SUBCUTANEOUS | 0 refills | Status: DC
  Filled 2022-01-06: qty 2, 28d supply, fill #0

## 2022-01-06 MED ORDER — ESCITALOPRAM OXALATE 10 MG PO TABS
10.0000 mg | ORAL_TABLET | Freq: Every day | ORAL | 0 refills | Status: DC
  Filled 2022-01-06: qty 30, 30d supply, fill #0

## 2022-01-06 MED ORDER — VITAMIN D (ERGOCALCIFEROL) 1.25 MG (50000 UNIT) PO CAPS
50000.0000 [IU] | ORAL_CAPSULE | ORAL | 0 refills | Status: DC
  Filled 2022-01-06: qty 4, 28d supply, fill #0

## 2022-01-07 ENCOUNTER — Ambulatory Visit: Payer: 59 | Admitting: Family Medicine

## 2022-01-07 VITALS — BP 110/80 | HR 78 | Ht 66.0 in | Wt 159.0 lb

## 2022-01-07 DIAGNOSIS — M9901 Segmental and somatic dysfunction of cervical region: Secondary | ICD-10-CM | POA: Diagnosis not present

## 2022-01-07 DIAGNOSIS — M2550 Pain in unspecified joint: Secondary | ICD-10-CM

## 2022-01-07 DIAGNOSIS — M9903 Segmental and somatic dysfunction of lumbar region: Secondary | ICD-10-CM | POA: Diagnosis not present

## 2022-01-07 DIAGNOSIS — M9902 Segmental and somatic dysfunction of thoracic region: Secondary | ICD-10-CM | POA: Diagnosis not present

## 2022-01-07 DIAGNOSIS — M9908 Segmental and somatic dysfunction of rib cage: Secondary | ICD-10-CM

## 2022-01-07 DIAGNOSIS — M9904 Segmental and somatic dysfunction of sacral region: Secondary | ICD-10-CM

## 2022-01-07 NOTE — Assessment & Plan Note (Signed)
Hypermobility still likely contributing.  Patient was to do some more strengthening but has not been able to. Patient is having some dull, throbbing aching discomfort.  Patient will start doing more of the exercises hopefully after the new year.  Will follow-up again in 6 to 8 weeks.

## 2022-01-07 NOTE — Patient Instructions (Signed)
Great to see you Overall doing well Try to incorporate more strength training  6-8 week follow up

## 2022-01-20 NOTE — Progress Notes (Signed)
Chief Complaint:   OBESITY Sherri Cobb is here to discuss her progress with her obesity treatment plan along with follow-up of her obesity related diagnoses. Sherri Cobb is on keeping a food journal and adhering to recommended goals of 1250 calories and 80+ grams of protein and states she is following her eating plan approximately 90% of the time. Sherri Cobb states she is yoga, walking, pilates 30-45 minutes 1-3 times per week.  Today's visit was #: 18 Starting weight: 188 lbs Starting date: 11/13/2020 Today's weight: 157 lbs Today's date: 01/06/2022 Total lbs lost to date: 31 lbs Total lbs lost since last in-office visit: 1  Interim History: Sherri Cobb has done well with weight loss over the holidays. Hunger is controlled with increased- Wegovy 1 mg weekly. Not skipping meals but feels appetite is decreased and does have to be mindful of eating and consistently getting all of protein and calorie needs met.   Subjective:   1. Vitamin D insufficiency Labs discussed during visit today. Vit D level of 46.7--stable since May 2023--taking ergocalciferol weekly. No nausea, vomiting or muscle weakness or other side effects.   2. Grieving Labs discussed during visit today. Taking Lexapro 10 mg--did not fill prescription until a couple of weeks ago--no side effects with Lexapro. Reports continues to feel blunted and grieving following father's sudden death.   3. Insulin resistance Labs discussed during visit today. A1c at 5.0/insulin at 11.4. Working on prescribed nutrition plan to decrease simple carbs, increase lean protein and exercise to promote weight loss. --on GLP-1, Wegovy 1 mg weekly without side effects.   4. Other hyperlipidemia Labs discussed during visit today. HDL of 44/Trig of 136/LDL 161--WRU is not at goal and would like HDL as high as possible. Not on medications for lipids.  Assessment/Plan:   1. Vitamin D insufficiency Continue/refill Vit D 50K IU once a week for 1 month with 0  refills. Follow up labs in 3-4 months.   -Refill Vitamin D, Ergocalciferol, (DRISDOL) 1.25 MG (50000 UNIT) CAPS capsule; Take 1 capsule by mouth every 7 (seven) days.  Dispense: 4 capsule; Refill: 0  2. Grieving Continue/Refill Lexapro 10 mg daily for 1 month with 0 refills.   -Refill escitalopram (LEXAPRO) 10 MG tablet; Take 1 tablet (10 mg total) by mouth daily.  Dispense: 30 tablet; Refill: 0  3. Insulin resistance Continue healthy eating plan with decreased simple carbohydrates, increased lean protein and exercise to promote weight loss. Also continue GLP-1 Wegovy 1 mg weekly to promote weight loss. Follow up labs in 3-4 months.   4. Other hyperlipidemia Continue healthy eating plan to decrease saturated fats and cholesterol, decrease simple carbs and increase lean protein and exercise to promote weight loss. Recheck labs in 3-4 months.   5. Obesity, Current BMI 25.4 Continue/Refill Wegovy 1 mg once weekly for 1 month with 0 refills.  -Refill Semaglutide-Weight Management (WEGOVY) 1 MG/0.5ML SOAJ; Inject 1 mg into the skin once a week.  Dispense: 2 mL; Refill: 0  Sherri Cobb is currently in the action stage of change. As such, her goal is to continue with weight loss efforts. She has agreed to keeping a food journal and adhering to recommended goals of 1250 calories and 80+ grams of protein daily.   Exercise goals: As is.  Behavioral modification strategies: increasing lean protein intake, decreasing simple carbohydrates, and no skipping meals.  Sherri Cobb has agreed to follow-up with our clinic in 4 weeks. She was informed of the importance of frequent follow-up visits to maximize her success  with intensive lifestyle modifications for her multiple health conditions.   Objective:   Blood pressure 103/70, pulse (!) 54, temperature 98.1 F (36.7 C), height 5\' 6"  (1.676 m), weight 157 lb (71.2 kg), SpO2 98 %. Body mass index is 25.34 kg/m.  General: Cooperative, alert, well developed,  in no acute distress. HEENT: Conjunctivae and lids unremarkable. Cardiovascular: Regular rhythm.  Lungs: Normal work of breathing. Neurologic: No focal deficits.   Lab Results  Component Value Date   CREATININE 0.93 12/09/2021   BUN 15 12/09/2021   NA 139 12/09/2021   K 4.8 12/09/2021   CL 103 12/09/2021   CO2 24 12/09/2021   Lab Results  Component Value Date   ALT 11 12/09/2021   AST 10 12/09/2021   ALKPHOS 40 (L) 12/09/2021   BILITOT 0.2 12/09/2021   Lab Results  Component Value Date   HGBA1C 5.0 12/09/2021   HGBA1C 5.1 06/03/2021   HGBA1C 5.1 11/13/2020   Lab Results  Component Value Date   INSULIN 11.4 12/09/2021   INSULIN 10.9 06/03/2021   INSULIN 13.2 11/13/2020   Lab Results  Component Value Date   TSH 2.410 12/09/2021   Lab Results  Component Value Date   CHOL 197 12/09/2021   HDL 44 12/09/2021   LDLCALC 129 (H) 12/09/2021   TRIG 136 12/09/2021   Lab Results  Component Value Date   VD25OH 46.7 12/09/2021   VD25OH 45.5 06/03/2021   VD25OH 27.12 (L) 12/19/2020   Lab Results  Component Value Date   WBC 6.3 12/09/2021   HGB 13.9 12/09/2021   HCT 40.6 12/09/2021   MCV 86 12/09/2021   PLT 266 12/09/2021   Lab Results  Component Value Date   IRON 34 (L) 12/19/2020   TIBC 305 12/19/2020   FERRITIN 50 12/19/2020   Attestation Statements:   Reviewed by clinician on day of visit: allergies, medications, problem list, medical history, surgical history, family history, social history, and previous encounter notes.  I, Brendell Tyus, am acting as transcriptionist for [employer], [employer].  I have reviewed the above documentation for accuracy and completeness, and I agree with the above. -  Webb Weed,PA-C

## 2022-02-04 ENCOUNTER — Encounter (INDEPENDENT_AMBULATORY_CARE_PROVIDER_SITE_OTHER): Payer: Self-pay | Admitting: Family Medicine

## 2022-02-04 ENCOUNTER — Ambulatory Visit (INDEPENDENT_AMBULATORY_CARE_PROVIDER_SITE_OTHER): Payer: 59 | Admitting: Family Medicine

## 2022-02-04 ENCOUNTER — Other Ambulatory Visit: Payer: Self-pay

## 2022-02-04 ENCOUNTER — Other Ambulatory Visit (HOSPITAL_COMMUNITY): Payer: Self-pay

## 2022-02-04 VITALS — BP 92/57 | HR 81 | Temp 97.8°F | Ht 66.0 in | Wt 154.0 lb

## 2022-02-04 DIAGNOSIS — E669 Obesity, unspecified: Secondary | ICD-10-CM

## 2022-02-04 DIAGNOSIS — F32A Depression, unspecified: Secondary | ICD-10-CM

## 2022-02-04 DIAGNOSIS — Z6824 Body mass index (BMI) 24.0-24.9, adult: Secondary | ICD-10-CM | POA: Insufficient documentation

## 2022-02-04 DIAGNOSIS — F3289 Other specified depressive episodes: Secondary | ICD-10-CM

## 2022-02-04 DIAGNOSIS — E559 Vitamin D deficiency, unspecified: Secondary | ICD-10-CM | POA: Diagnosis not present

## 2022-02-04 HISTORY — DX: Depression, unspecified: F32.A

## 2022-02-04 MED ORDER — ESCITALOPRAM OXALATE 10 MG PO TABS
10.0000 mg | ORAL_TABLET | Freq: Every day | ORAL | 1 refills | Status: DC
  Filled 2022-02-04: qty 30, 30d supply, fill #0

## 2022-02-04 MED ORDER — VITAMIN D (ERGOCALCIFEROL) 1.25 MG (50000 UNIT) PO CAPS
50000.0000 [IU] | ORAL_CAPSULE | ORAL | 1 refills | Status: DC
  Filled 2022-02-04: qty 4, 28d supply, fill #0

## 2022-02-04 MED ORDER — WEGOVY 1 MG/0.5ML ~~LOC~~ SOAJ
1.0000 mg | SUBCUTANEOUS | 1 refills | Status: DC
  Filled 2022-02-04: qty 2, 28d supply, fill #0

## 2022-02-17 NOTE — Progress Notes (Signed)
Chief Complaint:   OBESITY Sherri [facility] is here to discuss her progress with her obesity treatment plan along with follow-up of her obesity related diagnoses. Sherri Cobb is on keeping a food journal and adhering to recommended goals of 1250 calories and 80+ grams of protein and states she is following her eating plan approximately 90% of the time. Sherri Cobb states she is doing yoga and walking for 30 minutes 2 times per week.  Today's visit was #: 33 Starting weight: 188 lbs Starting date: 11/13/2020 Today's weight: 154 lbs Today's date: 02/04/2022 Total lbs lost to date: 34 Total lbs lost since last in-office visit: 3  Interim History: Sherri Cobb continues to do well with weight loss. She is not journaling but she is mostly trying to portion control and increase protein. She may not be meeting her nutritional goal.   Subjective:   1. Vitamin D insufficiency Sherri Cobb is on Vitamin D with no side effects noted, and she requests a refill today.   2. Emotional Eating Behavior Sherri Cobb is on Lexapro and she is working on decreasing emotional eating behaviors. She feels she is doing better with no side effects.   Assessment/Plan:   1. Vitamin D insufficiency We will refill Vitamin D for 2 months. We will recheck labs at her next visit.   - Vitamin D, Ergocalciferol, (DRISDOL) 1.25 MG (50000 UNIT) CAPS capsule; Take 1 capsule (50,000 Units total) by mouth every 7 (seven) days.  Dispense: 4 capsule; Refill: 1  2. Emotional Eating Behavior We will refill Lexapro for 2 months.   - escitalopram (LEXAPRO) 10 MG tablet; Take 1 tablet (10 mg total) by mouth daily.  Dispense: 30 tablet; Refill: 1  3. BMI 24.0-24.9, adult - Semaglutide-Weight Management (WEGOVY) 1 MG/0.5ML SOAJ; Inject 1 mg into the skin once a week.  Dispense: 2 mL; Refill: 1  4. Obesity, Beginning BMI 30.34 We will refill Wegovy for 2 months.   - Semaglutide-Weight Management (WEGOVY) 1 MG/0.5ML SOAJ; Inject 1 mg into the skin  once a week.  Dispense: 2 mL; Refill: 1  Sherri Cobb is currently in the action stage of change. As such, her goal is to continue with weight loss efforts. She has agreed to keeping a food journal and adhering to recommended goals of 1200 calories and 80+ grams of protein daily.   Exercise goals: As is. Patient is to work on maintaining muscle mass.   Behavioral modification strategies: increasing lean protein intake.  Sherri Cobb has agreed to follow-up with our clinic in 8 weeks. She was informed of the importance of frequent follow-up visits to maximize her success with intensive lifestyle modifications for her multiple health conditions.   Objective:   Blood pressure (!) 92/57, pulse 81, temperature 97.8 F (36.6 C), height 5\' 6"  (1.676 m), weight 154 lb (69.9 kg), SpO2 96 %. Body mass index is 24.86 kg/m.  General: Cooperative, alert, well developed, in no acute distress. HEENT: Conjunctivae and lids unremarkable. Cardiovascular: Regular rhythm.  Lungs: Normal work of breathing. Neurologic: No focal deficits.   Lab Results  Component Value Date   CREATININE 0.93 12/09/2021   BUN 15 12/09/2021   NA 139 12/09/2021   K 4.8 12/09/2021   CL 103 12/09/2021   CO2 24 12/09/2021   Lab Results  Component Value Date   ALT 11 12/09/2021   AST 10 12/09/2021   ALKPHOS 40 (L) 12/09/2021   BILITOT 0.2 12/09/2021   Lab Results  Component Value Date   HGBA1C 5.0 12/09/2021  HGBA1C 5.1 06/03/2021   HGBA1C 5.1 11/13/2020   Lab Results  Component Value Date   INSULIN 11.4 12/09/2021   INSULIN 10.9 06/03/2021   INSULIN 13.2 11/13/2020   Lab Results  Component Value Date   TSH 2.410 12/09/2021   Lab Results  Component Value Date   CHOL 197 12/09/2021   HDL 44 12/09/2021   LDLCALC 129 (H) 12/09/2021   TRIG 136 12/09/2021   Lab Results  Component Value Date   VD25OH 46.7 12/09/2021   VD25OH 45.5 06/03/2021   VD25OH 27.12 (L) 12/19/2020   Lab Results  Component Value Date    WBC 6.3 12/09/2021   HGB 13.9 12/09/2021   HCT 40.6 12/09/2021   MCV 86 12/09/2021   PLT 266 12/09/2021   Lab Results  Component Value Date   IRON 34 (L) 12/19/2020   TIBC 305 12/19/2020   FERRITIN 50 12/19/2020   Attestation Statements:   Reviewed by clinician on day of visit: allergies, medications, problem list, medical history, surgical history, family history, social history, and previous encounter notes.   I, Trixie Dredge, am acting as transcriptionist for Dennard Nip, MD.  I have reviewed the above documentation for accuracy and completeness, and I agree with the above. -  Dennard Nip, MD

## 2022-02-18 ENCOUNTER — Ambulatory Visit: Payer: 59 | Admitting: Family Medicine

## 2022-03-16 NOTE — Progress Notes (Unsigned)
[place] [place] [facility] Phone: [phone] Subjective:   Fontaine No, am serving as a scribe for Dr. Hulan Saas.  I'm seeing this patient by the request  of:  Kuneff, Renee A, DO  CC: Back and neck pain and allover pain  [id number]  Sherri [facility] is a 42 y.o. female coming in with complaint of back and neck pain. OMT on 01/07/2022. Patient states that her pain is still there with some days better than other days. Was doing PT and dry needling. Dry needling was helpful but feels like she needs multiple treatments. Trying to work on HEP now that she is done with PT.   Medications patient has been prescribed: None  Taking:         Reviewed prior external information including notes and imaging from previsou exam, outside providers and external EMR if available.   As well as notes that were available from care everywhere and other healthcare systems.  Past medical history, social, surgical and family history all reviewed in electronic medical record.  No pertanent information unless stated regarding to the chief complaint.   Past Medical History:  Diagnosis Date   Allergies    Allergy    Back pain    Chicken pox    Depression 02/04/2022   Fatty liver    Gallbladder problem    Hx of vaginitis 12/10/2017   Recurrent yeast with Mirena IUD Reported history of uterine infection, but unable to find that in her Care Everywhere chart.   Lactose intolerance    Menometrorrhagia    Before BCPs   Migraines    Psoriasis     No Known Allergies   Review of Systems:  No headache, visual changes, nausea, vomiting, diarrhea, constipation, dizziness, abdominal pain, skin rash, fevers, chills, night sweats, weight loss, swollen lymph nodes,  joint swelling, chest pain, shortness of breath, mood changes. POSITIVE muscle aches, body aches  Objective  Blood pressure 112/76, pulse 96, height '5\' 6"'$  (1.676 m), weight 162 lb  (73.5 kg), SpO2 97 %.   General: No apparent distress alert and oriented x3 mood and affect normal, dressed appropriately.  HEENT: Pupils equal, extraocular movements intact  Respiratory: Patient's speak in full sentences and does not appear short of breath  Cardiovascular: No lower extremity edema, non tender, no erythema  Hypermobility still noted.  Tender to palpation over the whole paraspinal musculature from the cervical to thoracic appears today.  Does have some limitation with sidebending in the neck.  Extension is limited.  Fairly significant.  Osteopathic findings  C6 flexed rotated and side bent left T3 extended rotated and side bent right inhaled rib T9 extended rotated and side bent left L2 flexed rotated and side bent right Sacrum right on right       Assessment and Plan:  Hypermobility arthralgia Continues to have the hypermobility.  Discussed with patient icing regimen and home exercises.  Has had iron deficiency previously and did need to consider the possibility of rechecking if this continues to give her difficulty.  Past medical history significant for psoriasis but did not see any psoriatic arthritis that makes me concerned at the moment.  Can check other labs if necessary.  Patient does respond well to osteopathic manipulation.  Follow-up with me again in 8 weeks.    Nonallopathic problems  Decision today to treat with OMT was based on Physical Exam  After verbal consent patient was treated with HVLA, ME,  FPR techniques in cervical, rib, thoracic, lumbar, and sacral  areas  Patient tolerated the procedure well with improvement in symptoms  Patient given exercises, stretches and lifestyle modifications  See medications in patient instructions if given  Patient will follow up in 4-8 weeks     The above documentation has been reviewed and is accurate and complete Lyndal Pulley, DO         Note: This dictation was prepared with Dragon dictation along  with smaller phrase technology. Any transcriptional errors that result from this process are unintentional.

## 2022-03-17 ENCOUNTER — Ambulatory Visit: Payer: 59 | Admitting: Family Medicine

## 2022-03-17 VITALS — BP 112/76 | HR 96 | Ht 66.0 in | Wt 162.0 lb

## 2022-03-17 DIAGNOSIS — M9908 Segmental and somatic dysfunction of rib cage: Secondary | ICD-10-CM

## 2022-03-17 DIAGNOSIS — M9904 Segmental and somatic dysfunction of sacral region: Secondary | ICD-10-CM

## 2022-03-17 DIAGNOSIS — M357 Hypermobility syndrome: Secondary | ICD-10-CM | POA: Diagnosis not present

## 2022-03-17 DIAGNOSIS — M9901 Segmental and somatic dysfunction of cervical region: Secondary | ICD-10-CM | POA: Diagnosis not present

## 2022-03-17 DIAGNOSIS — M9902 Segmental and somatic dysfunction of thoracic region: Secondary | ICD-10-CM | POA: Diagnosis not present

## 2022-03-17 DIAGNOSIS — M2550 Pain in unspecified joint: Secondary | ICD-10-CM

## 2022-03-17 DIAGNOSIS — M9903 Segmental and somatic dysfunction of lumbar region: Secondary | ICD-10-CM

## 2022-03-17 NOTE — Assessment & Plan Note (Signed)
Continues to have the hypermobility.  Discussed with patient icing regimen and home exercises.  Has had iron deficiency previously and did need to consider the possibility of rechecking if this continues to give her difficulty.  Past medical history significant for psoriasis but did not see any psoriatic arthritis that makes me concerned at the moment.  Can check other labs if necessary.  Patient does respond well to osteopathic manipulation.  Follow-up with me again in 8 weeks.

## 2022-03-17 NOTE — Patient Instructions (Signed)
Good to see you Try starting day with standing desk Wish you the best See me again in 8 weeks

## 2022-04-01 ENCOUNTER — Encounter (INDEPENDENT_AMBULATORY_CARE_PROVIDER_SITE_OTHER): Payer: Self-pay | Admitting: Family Medicine

## 2022-04-01 ENCOUNTER — Other Ambulatory Visit: Payer: Self-pay

## 2022-04-01 ENCOUNTER — Ambulatory Visit (INDEPENDENT_AMBULATORY_CARE_PROVIDER_SITE_OTHER): Payer: 59 | Admitting: Family Medicine

## 2022-04-01 VITALS — BP 96/63 | HR 78 | Temp 98.1°F | Ht 66.0 in | Wt 154.0 lb

## 2022-04-01 DIAGNOSIS — E538 Deficiency of other specified B group vitamins: Secondary | ICD-10-CM | POA: Diagnosis not present

## 2022-04-01 DIAGNOSIS — E559 Vitamin D deficiency, unspecified: Secondary | ICD-10-CM | POA: Diagnosis not present

## 2022-04-01 DIAGNOSIS — E7800 Pure hypercholesterolemia, unspecified: Secondary | ICD-10-CM

## 2022-04-01 DIAGNOSIS — Z6825 Body mass index (BMI) 25.0-25.9, adult: Secondary | ICD-10-CM

## 2022-04-01 DIAGNOSIS — E669 Obesity, unspecified: Secondary | ICD-10-CM

## 2022-04-01 DIAGNOSIS — E88819 Insulin resistance, unspecified: Secondary | ICD-10-CM

## 2022-04-01 MED ORDER — WEGOVY 1 MG/0.5ML ~~LOC~~ SOAJ
1.0000 mg | SUBCUTANEOUS | 1 refills | Status: DC
  Filled 2022-04-01: qty 2, 28d supply, fill #0

## 2022-04-01 MED ORDER — VITAMIN D (ERGOCALCIFEROL) 1.25 MG (50000 UNIT) PO CAPS
50000.0000 [IU] | ORAL_CAPSULE | ORAL | 1 refills | Status: DC
  Filled 2022-04-01: qty 4, 28d supply, fill #0

## 2022-04-02 LAB — LIPID PANEL WITH LDL/HDL RATIO
Cholesterol, Total: 162 mg/dL (ref 100–199)
HDL: 45 mg/dL (ref >39)
LDL Chol Calc (NIH): 104 mg/dL — ABNORMAL HIGH (ref 0–99)
LDL/HDL Ratio: 2.3 ratio (ref 0.0–3.2)
Triglycerides: 68 mg/dL (ref 0–149)
VLDL Cholesterol Cal: 13 mg/dL (ref 5–40)

## 2022-04-02 LAB — CMP14+EGFR
ALT: 10 IU/L (ref 0–32)
AST: 10 IU/L (ref 0–40)
Albumin/Globulin Ratio: 1.7 (ref 1.2–2.2)
Albumin: 4.3 g/dL (ref 3.9–4.9)
Alkaline Phosphatase: 36 IU/L — ABNORMAL LOW (ref 44–121)
BUN/Creatinine Ratio: 19 (ref 9–23)
BUN: 17 mg/dL (ref 6–20)
Bilirubin Total: 0.4 mg/dL (ref 0.0–1.2)
CO2: 20 mmol/L (ref 20–29)
Calcium: 9.4 mg/dL (ref 8.7–10.2)
Chloride: 106 mmol/L (ref 96–106)
Creatinine, Ser: 0.88 mg/dL (ref 0.57–1.00)
Globulin, Total: 2.6 g/dL (ref 1.5–4.5)
Glucose: 90 mg/dL (ref 70–99)
Potassium: 5.1 mmol/L (ref 3.5–5.2)
Sodium: 139 mmol/L (ref 134–144)
Total Protein: 6.9 g/dL (ref 6.0–8.5)
eGFR: 86 mL/min/{1.73_m2} (ref >59)

## 2022-04-02 LAB — INSULIN, RANDOM: INSULIN: 10.9 u[IU]/mL (ref 2.6–24.9)

## 2022-04-02 LAB — VITAMIN D 25 HYDROXY (VIT D DEFICIENCY, FRACTURES): Vit D, 25-Hydroxy: 38.4 ng/mL (ref 30.0–100.0)

## 2022-04-02 LAB — TSH: TSH: 2.15 u[IU]/mL (ref 0.450–4.500)

## 2022-04-02 LAB — VITAMIN B12: Vitamin B-12: 296 pg/mL (ref 232–1245)

## 2022-04-02 LAB — HEMOGLOBIN A1C
Est. average glucose Bld gHb Est-mCnc: 103 mg/dL
Hgb A1c MFr Bld: 5.2 % (ref 4.8–5.6)

## 2022-04-06 NOTE — Progress Notes (Signed)
Chief Complaint:   OBESITY Sherri [facility] is here to discuss her progress with her obesity treatment plan along with follow-up of her obesity related diagnoses. Sherri Cobb is on keeping a food journal and adhering to recommended goals of 1200 calories and 80+ grams of protein and states she is following her eating plan approximately 80% of the time. Sherri Cobb states she is exercising 4 times per week.    Today's visit was #: 20 Starting weight: 188 lbs Starting date: 11/13/2020 Today's weight: 154 lbs Today's date: 04/01/2022 Total lbs lost to date: 34 Total lbs lost since last in-office visit: 0  Interim History: Sherri Cobb has done well with maintaining her weight loss over the last 2 months. She has increased her exercise and her hunger is mostly controlled although she feels it is not as well controlled as it used to be.   Subjective:   1. Vitamin D insufficiency Sherri Cobb is on Vitamin D, and her last level was at goal. She is at risk of over-replacement.   2. Pure hypercholesterolemia Sherri Cobb is not on statin, and she is working on her diet. She denies chest pain.   3. B12 deficiency Sherri Cobb has a history of B12, and she is due for labs. She is on a B12 rich diet.   4. Insulin resistance Sherri Cobb is working on decreasing simple carbohydrate and increasing her exercise.   Assessment/Plan:   1. Vitamin D insufficiency We will check labs today, and we will refill prescription Vitamin D for 2 months.   - Vitamin D, Ergocalciferol, (DRISDOL) 1.25 MG (50000 UNIT) CAPS capsule; Take 1 capsule (50,000 Units total) by mouth every 7 (seven) days.  Dispense: 4 capsule; Refill: 1 - VITAMIN D 25 Hydroxy (Vit-D Deficiency, Fractures)  2. Pure hypercholesterolemia We will check labs today. Sherri Cobb will continue with her diet and exercise.   - Lipid Panel With LDL/HDL Ratio - TSH  3. B12 deficiency We will check labs today, and we will follow-up at [place] next visit.   - Vitamin B12  4.  Insulin resistance We will check labs today. Sherri Cobb will continue with her diet and exercise.   - CMP14+EGFR - Hemoglobin A1c - Insulin, random  5. BMI 25.0-25.9,adult - Semaglutide-Weight Management (WEGOVY) 1 MG/0.5ML SOAJ; Inject 1 mg into the skin once a week.  Dispense: 2 mL; Refill: 1  6. Obesity, Beginning BMI 30.34 Sherri Cobb will continue [place], and we will refill for 2 months.   - Semaglutide-Weight Management (WEGOVY) 1 MG/0.5ML SOAJ; Inject 1 mg into the skin once a week.  Dispense: 2 mL; Refill: 1  Sherri Cobb is currently in the action stage of change. As such, her goal is to continue with weight loss efforts. She has agreed to keeping a food journal and adhering to recommended goals of 1200 calories and 80+ grams of protein daily.   Exercise goals: As is.   Behavioral modification strategies: increasing lean protein intake and decreasing simple carbohydrates.  Sherri Cobb has agreed to follow-up with our clinic in 8 weeks. She was informed of the importance of frequent follow-up visits to maximize her success with intensive lifestyle modifications for her multiple health conditions.   Sherri Cobb was informed we would discuss her lab results at her next visit unless there is a critical issue that needs to be addressed sooner. Sherri Cobb agreed to keep her next visit at the agreed upon time to discuss these results.  Objective:   Blood pressure 96/63, pulse 78, temperature 98.1 F (36.7 C), height 5\' 6"  (  1.676 m), weight 154 lb (69.9 kg), SpO2 98 %. Body mass index is 24.86 kg/m.  Lab Results  Component Value Date   CREATININE 0.88 04/01/2022   BUN 17 04/01/2022   NA 139 04/01/2022   K 5.1 04/01/2022   CL 106 04/01/2022   CO2 20 04/01/2022   Lab Results  Component Value Date   ALT 10 04/01/2022   AST 10 04/01/2022   ALKPHOS 36 (L) 04/01/2022   BILITOT 0.4 04/01/2022   Lab Results  Component Value Date   HGBA1C 5.2 04/01/2022   HGBA1C 5.0 12/09/2021   HGBA1C 5.1  06/03/2021   HGBA1C 5.1 11/13/2020   Lab Results  Component Value Date   INSULIN 10.9 04/01/2022   INSULIN 11.4 12/09/2021   INSULIN 10.9 06/03/2021   INSULIN 13.2 11/13/2020   Lab Results  Component Value Date   TSH 2.150 04/01/2022   Lab Results  Component Value Date   CHOL 162 04/01/2022   HDL 45 04/01/2022   LDLCALC 104 (H) 04/01/2022   TRIG 68 04/01/2022   Lab Results  Component Value Date   VD25OH 38.4 04/01/2022   VD25OH 46.7 12/09/2021   VD25OH 45.5 06/03/2021   Lab Results  Component Value Date   WBC 6.3 12/09/2021   HGB 13.9 12/09/2021   HCT 40.6 12/09/2021   MCV 86 12/09/2021   PLT 266 12/09/2021   Lab Results  Component Value Date   IRON 34 (L) 12/19/2020   TIBC 305 12/19/2020   FERRITIN 50 12/19/2020   Attestation Statements:   Reviewed by clinician on day of visit: allergies, medications, problem list, medical history, surgical history, family history, social history, and previous encounter notes.   I, Trixie Dredge, am acting as transcriptionist for Dennard Nip, MD.  I have reviewed the above documentation for accuracy and completeness, and I agree with the above. -  Dennard Nip, MD

## 2022-05-12 ENCOUNTER — Ambulatory Visit: Payer: 59 | Admitting: Family Medicine

## 2022-05-27 ENCOUNTER — Encounter (INDEPENDENT_AMBULATORY_CARE_PROVIDER_SITE_OTHER): Payer: Self-pay | Admitting: Family Medicine

## 2022-05-27 ENCOUNTER — Ambulatory Visit (INDEPENDENT_AMBULATORY_CARE_PROVIDER_SITE_OTHER): Payer: 59 | Admitting: Family Medicine

## 2022-05-27 ENCOUNTER — Other Ambulatory Visit: Payer: Self-pay

## 2022-05-27 VITALS — BP 99/71 | HR 97 | Temp 99.2°F | Ht 66.0 in | Wt 154.0 lb

## 2022-05-27 DIAGNOSIS — R632 Polyphagia: Secondary | ICD-10-CM | POA: Diagnosis not present

## 2022-05-27 DIAGNOSIS — E669 Obesity, unspecified: Secondary | ICD-10-CM

## 2022-05-27 DIAGNOSIS — Z6825 Body mass index (BMI) 25.0-25.9, adult: Secondary | ICD-10-CM

## 2022-05-27 DIAGNOSIS — E538 Deficiency of other specified B group vitamins: Secondary | ICD-10-CM

## 2022-05-27 DIAGNOSIS — Z6824 Body mass index (BMI) 24.0-24.9, adult: Secondary | ICD-10-CM

## 2022-05-27 DIAGNOSIS — E559 Vitamin D deficiency, unspecified: Secondary | ICD-10-CM

## 2022-05-27 MED ORDER — WEGOVY 1 MG/0.5ML ~~LOC~~ SOAJ
1.0000 mg | SUBCUTANEOUS | 0 refills | Status: DC
  Filled 2022-05-27: qty 2, 28d supply, fill #0
  Filled 2022-07-28 – 2022-08-16 (×3): qty 2, 28d supply, fill #1

## 2022-05-27 MED ORDER — VITAMIN D (ERGOCALCIFEROL) 1.25 MG (50000 UNIT) PO CAPS
50000.0000 [IU] | ORAL_CAPSULE | ORAL | 0 refills | Status: DC
  Filled 2022-05-27: qty 12, 84d supply, fill #0

## 2022-05-31 NOTE — Progress Notes (Unsigned)
Chief Complaint:   OBESITY Sherri Cobb is here to discuss her progress with her obesity treatment plan along with follow-up of her obesity related diagnoses. Sherri Cobb is on keeping a food journal and adhering to recommended goals of 1200 calories and 80+ grams of protein and states she is following her eating plan approximately 70% of the time. Sherri Cobb states she is working out for 30-60 minutes 5 times per week.  Today's visit was #: 21 Starting weight: 188 lbs Starting date: 11/13/2020 Today's weight: 154 lbs Today's date: 05/27/2022 Total lbs lost to date: 34 Total lbs lost since last in-office visit: 0  Interim History: Sherri Cobb has done well with maintaining her weight loss.  She notes some carbohydrate cravings more in the evenings, but she is usually able to control this.  Subjective:   1. Vitamin D insufficiency Sherri Cobb's Vitamin D level is below goal. She had missed some Vitamin D doses. I discussed labs with the patient today.   2. Polyphagia Sherri Cobb is doing well on Wegovy with no nausea or vomiting.  She has lost weight and has maintained that weight loss well.  3. B12 deficiency Sherri Cobb is eating a more plant-based diet and her B12 intake has decreased.  She has no history of gastric surgery or pernicious anemia.  Assessment/Plan:   1. Vitamin D insufficiency Sherri Cobb will continue prescription vitamin D, and we will refill for 90 days.  - Vitamin D, Ergocalciferol, (DRISDOL) 1.25 MG (50000 UNIT) CAPS capsule; Take 1 capsule (50,000 Units total) by mouth every 7 (seven) days.  Dispense: 12 capsule; Refill: 0  2. Polyphagia Sherri Cobb will continue Wegovy 1 mg once weekly, we will refill for 90 days.  - Semaglutide-Weight Management (WEGOVY) 1 MG/0.5ML SOAJ; Inject 1 mg into the skin once a week.  Dispense: 6 mL; Refill: 0  3. B12 deficiency Sherri Cobb agreed to start  OTC B12 1,000 mcg daily.   4. BMI 25.0-25.9,adult  5. Obesity, Beginning BMI 30.34 We will refill  Wegovy for 1 month.   - Semaglutide-Weight Management (WEGOVY) 1 MG/0.5ML SOAJ; Inject 1 mg into the skin once a week.  Dispense: 6 mL; Refill: 0  Sherri Cobb is currently in the action stage of change. As such, her goal is to continue with weight loss efforts. She has agreed to the Category 2 Plan.   Exercise goals: As is.   Behavioral modification strategies: emotional eating strategies.  Sherri Cobb has agreed to follow-up with our clinic in 12 weeks. She was informed of the importance of frequent follow-up visits to maximize her success with intensive lifestyle modifications for her multiple health conditions.   Objective:   Blood pressure 99/71, pulse 97, temperature 99.2 F (37.3 C), height 5\' 6"  (1.676 m), weight 154 lb (69.9 kg), SpO2 98 %. Body mass index is 24.86 kg/m.  Lab Results  Component Value Date   CREATININE 0.88 04/01/2022   BUN 17 04/01/2022   NA 139 04/01/2022   K 5.1 04/01/2022   CL 106 04/01/2022   CO2 20 04/01/2022   Lab Results  Component Value Date   ALT 10 04/01/2022   AST 10 04/01/2022   ALKPHOS 36 (L) 04/01/2022   BILITOT 0.4 04/01/2022   Lab Results  Component Value Date   HGBA1C 5.2 04/01/2022   HGBA1C 5.0 12/09/2021   HGBA1C 5.1 06/03/2021   HGBA1C 5.1 11/13/2020   Lab Results  Component Value Date   INSULIN 10.9 04/01/2022   INSULIN 11.4 12/09/2021   INSULIN 10.9 06/03/2021  INSULIN 13.2 11/13/2020   Lab Results  Component Value Date   TSH 2.150 04/01/2022   Lab Results  Component Value Date   CHOL 162 04/01/2022   HDL 45 04/01/2022   LDLCALC 104 (H) 04/01/2022   TRIG 68 04/01/2022   Lab Results  Component Value Date   VD25OH 38.4 04/01/2022   VD25OH 46.7 12/09/2021   VD25OH 45.5 06/03/2021   Lab Results  Component Value Date   WBC 6.3 12/09/2021   HGB 13.9 12/09/2021   HCT 40.6 12/09/2021   MCV 86 12/09/2021   PLT 266 12/09/2021   Lab Results  Component Value Date   IRON 34 (L) 12/19/2020   TIBC 305 12/19/2020    FERRITIN 50 12/19/2020   Attestation Statements:   Reviewed by clinician on day of visit: allergies, medications, problem list, medical history, surgical history, family history, social history, and previous encounter notes.   I, Burt Knack, am acting as transcriptionist for Quillian Quince, MD.  I have reviewed the above documentation for accuracy and completeness, and I agree with the above. -  Quillian Quince, MD

## 2022-06-03 ENCOUNTER — Encounter: Payer: Self-pay | Admitting: Family Medicine

## 2022-06-03 ENCOUNTER — Ambulatory Visit: Payer: 59 | Admitting: Family Medicine

## 2022-06-03 VITALS — BP 112/78 | HR 72 | Ht 66.0 in | Wt 157.0 lb

## 2022-06-03 DIAGNOSIS — M9902 Segmental and somatic dysfunction of thoracic region: Secondary | ICD-10-CM

## 2022-06-03 DIAGNOSIS — M9904 Segmental and somatic dysfunction of sacral region: Secondary | ICD-10-CM

## 2022-06-03 DIAGNOSIS — M9903 Segmental and somatic dysfunction of lumbar region: Secondary | ICD-10-CM

## 2022-06-03 DIAGNOSIS — M9908 Segmental and somatic dysfunction of rib cage: Secondary | ICD-10-CM

## 2022-06-03 DIAGNOSIS — M9901 Segmental and somatic dysfunction of cervical region: Secondary | ICD-10-CM | POA: Diagnosis not present

## 2022-06-03 DIAGNOSIS — M2550 Pain in unspecified joint: Secondary | ICD-10-CM | POA: Diagnosis not present

## 2022-06-03 NOTE — Progress Notes (Signed)
[name] [address] [place] [address] Phone: [phone] Subjective:   Sherri Cobb, am serving as a scribe for Dr. Antoine Primas.  I'm seeing this patient by the request  of:  Kuneff, Renee A, DO  CC: Back and neck pain follow-up  [id number]  AROURA Cobb is a 42 y.o. female coming in with complaint of back and neck pain. OMT on 03/17/2022. Patient states that she is doing a lot more pilates and yoga this week and notices that her pain is less.   Medications patient has been prescribed:   Taking:         Reviewed prior external information including notes and imaging from previsou exam, outside providers and external EMR if available.   As well as notes that were available from care everywhere and other healthcare systems.  Past medical history, social, surgical and family history all reviewed in electronic medical record.  No pertanent information unless stated regarding to the chief complaint.   Past Medical History:  Diagnosis Date   Allergies    Allergy    Back pain    Chicken pox    Depression 02/04/2022   Fatty liver    Gallbladder problem    Hx of vaginitis 12/10/2017   Recurrent yeast with Mirena IUD Reported history of uterine infection, but unable to find that in her Care Everywhere chart.   Lactose intolerance    Menometrorrhagia    Before BCPs   Migraines    Psoriasis     No Known Allergies   Review of Systems:  No headache, visual changes, nausea, vomiting, diarrhea, constipation, dizziness, abdominal pain, skin rash, fevers, chills, night sweats, weight loss, swollen lymph nodes, body aches, joint swelling, chest pain, shortness of breath, mood changes. POSITIVE muscle aches  Objective  Blood pressure 112/78, pulse 72, height 5\' 6"  (1.676 m), weight 157 lb (71.2 kg), SpO2 99 %.   General: No apparent distress alert and oriented x3 mood and affect normal, dressed appropriately.  HEENT: Pupils  equal, extraocular movements intact  Respiratory: Patient's speak in full sentences and does not appear short of breath  Cardiovascular: No lower extremity edema, non tender, no erythema  Low back exam does have some loss of lordosis noted.  Some tenderness to palpation noted.  Hypermobility is noted now.  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  Doing well with the Pilates.  Patient does do a lot of yoga as well for more mindfulness.  Discussed icing regimen and home exercises.  Discussed which activities to do the ones to avoid.  To be increasing activity slowly.  Follow-up again in 6 to 8 weeks    Nonallopathic problems  Decision today to treat with OMT was based on Physical Exam  After verbal consent patient was treated with HVLA, ME, FPR techniques in cervical, rib, thoracic, lumbar, and sacral  areas  Patient tolerated the procedure well with improvement in symptoms  Patient given exercises, stretches and lifestyle modifications  See medications in patient instructions if given  Patient will follow up in 4-8 weeks     The above documentation has been  reviewed and is accurate and complete [name], DO         Note: This dictation was prepared with Dragon dictation along with smaller phrase technology. Any transcriptional errors that result from this process are unintentional.

## 2022-06-03 NOTE — Patient Instructions (Signed)
Good to see you! Enjoy the food See you again in 2-3 months

## 2022-06-03 NOTE — Assessment & Plan Note (Signed)
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  Doing well with the Pilates.  Patient does do a lot of yoga as well for more mindfulness.  Discussed icing regimen and home exercises.  Discussed which activities to do the ones to avoid.  To be increasing activity slowly.  Follow-up again in 6 to 8 weeks

## 2022-07-28 ENCOUNTER — Other Ambulatory Visit (HOSPITAL_COMMUNITY): Payer: Self-pay

## 2022-07-28 NOTE — Progress Notes (Deleted)
  [name] [address] [place] [address] Phone: [phone] Subjective:    I'm seeing this patient by the request  of:  Kuneff, Renee A, DO  CC:   [id number]  Sherri Cobb is a 42 y.o. female coming in with complaint of back and neck pain. OMT on 06/03/2022. Patient states   Medications patient has been prescribed:   Taking:         Reviewed prior external information including notes and imaging from previsou exam, outside providers and external EMR if available.   As well as notes that were available from care everywhere and other healthcare systems.  Past medical history, social, surgical and family history all reviewed in electronic medical record.  No pertanent information unless stated regarding to the chief complaint.   Past Medical History:  Diagnosis Date   Allergies    Allergy    Back pain    Chicken pox    Depression 02/04/2022   Fatty liver    Gallbladder problem    Hx of vaginitis 12/10/2017   Recurrent yeast with Mirena IUD Reported history of uterine infection, but unable to find that in her Care Everywhere chart.   Lactose intolerance    Menometrorrhagia    Before BCPs   Migraines    Psoriasis     No Known Allergies   Review of Systems:  No headache, visual changes, nausea, vomiting, diarrhea, constipation, dizziness, abdominal pain, skin rash, fevers, chills, night sweats, weight loss, swollen lymph nodes, body aches, joint swelling, chest pain, shortness of breath, mood changes. POSITIVE muscle aches  Objective  There were no vitals taken for this visit.   General: No apparent distress alert and oriented x3 mood and affect normal, dressed appropriately.  HEENT: Pupils equal, extraocular movements intact  Respiratory: Patient's speak in full sentences and does not appear short of breath  Cardiovascular: No lower extremity edema, non tender, no erythema  Gait MSK:  Back   Osteopathic  findings  C2 flexed rotated and side bent right C6 flexed rotated and side bent left T3 extended rotated and side bent right inhaled rib T9 extended rotated and side bent left L2 flexed rotated and side bent right Sacrum right on right       Assessment and Plan:  No problem-specific Assessment & Plan notes found for this encounter.    Nonallopathic problems  Decision today to treat with OMT was based on Physical Exam  After verbal consent patient was treated with HVLA, ME, FPR techniques in cervical, rib, thoracic, lumbar, and sacral  areas  Patient tolerated the procedure well with improvement in symptoms  Patient given exercises, stretches and lifestyle modifications  See medications in patient instructions if given  Patient will follow up in 4-8 weeks             Note: This dictation was prepared with Dragon dictation along with smaller phrase technology. Any transcriptional errors that result from this process are unintentional.

## 2022-08-05 ENCOUNTER — Other Ambulatory Visit (HOSPITAL_COMMUNITY): Payer: Self-pay

## 2022-08-05 ENCOUNTER — Ambulatory Visit: Payer: 59 | Admitting: Family Medicine

## 2022-08-12 DIAGNOSIS — Z419 Encounter for procedure for purposes other than remedying health state, unspecified: Secondary | ICD-10-CM | POA: Diagnosis not present

## 2022-08-13 ENCOUNTER — Other Ambulatory Visit: Payer: Self-pay

## 2022-08-16 ENCOUNTER — Other Ambulatory Visit (HOSPITAL_COMMUNITY): Payer: Self-pay

## 2022-08-16 ENCOUNTER — Encounter (INDEPENDENT_AMBULATORY_CARE_PROVIDER_SITE_OTHER): Payer: Self-pay | Admitting: Family Medicine

## 2022-08-19 ENCOUNTER — Encounter (INDEPENDENT_AMBULATORY_CARE_PROVIDER_SITE_OTHER): Payer: Self-pay | Admitting: Family Medicine

## 2022-08-19 ENCOUNTER — Telehealth (INDEPENDENT_AMBULATORY_CARE_PROVIDER_SITE_OTHER): Payer: Medicaid Other | Admitting: Family Medicine

## 2022-08-19 DIAGNOSIS — Z6825 Body mass index (BMI) 25.0-25.9, adult: Secondary | ICD-10-CM | POA: Diagnosis not present

## 2022-08-19 DIAGNOSIS — E669 Obesity, unspecified: Secondary | ICD-10-CM | POA: Diagnosis not present

## 2022-08-19 DIAGNOSIS — E559 Vitamin D deficiency, unspecified: Secondary | ICD-10-CM | POA: Diagnosis not present

## 2022-08-19 DIAGNOSIS — F439 Reaction to severe stress, unspecified: Secondary | ICD-10-CM | POA: Diagnosis not present

## 2022-08-19 DIAGNOSIS — R632 Polyphagia: Secondary | ICD-10-CM

## 2022-08-25 ENCOUNTER — Other Ambulatory Visit: Payer: Self-pay

## 2022-08-25 MED ORDER — WEGOVY 1 MG/0.5ML ~~LOC~~ SOAJ
1.0000 mg | SUBCUTANEOUS | 0 refills | Status: DC
  Filled 2022-08-25: qty 2, 28d supply, fill #0
  Filled 2022-09-03: qty 6, 84d supply, fill #0

## 2022-08-25 MED ORDER — VITAMIN D (ERGOCALCIFEROL) 1.25 MG (50000 UNIT) PO CAPS
50000.0000 [IU] | ORAL_CAPSULE | ORAL | 0 refills | Status: DC
  Filled 2022-08-25: qty 12, 84d supply, fill #0

## 2022-08-25 NOTE — Progress Notes (Signed)
TeleHealth Visit:  Due to the COVID-19 pandemic, this visit was completed with telemedicine (audio/video) technology to reduce patient and provider exposure as well as to preserve personal protective equipment.   Sherri Cobb has verbally consented to this TeleHealth visit. The patient is located at home, the provider is located at the [facility] office. The participants in this visit include the listed provider and patient. The visit was conducted today via MyChart video.   Chief Complaint: OBESITY Sherri Cobb is here to discuss her progress with her obesity treatment plan along with follow-up of her obesity related diagnoses. Sherri Cobb is on the Category 2 Plan and states she is following her eating plan approximately 50% of the time. Sherri Cobb states she is doing 0 minutes 0 times per week.  Today's visit was #: 22 Starting weight: 188 lbs Starting date: 11/13/2020  Interim History: Patient has had some stressors and she feels she has gained 3 to 4 pounds in the last 3 months.  She is mindful of her food options and she is trying to increase her protein.  Subjective:   1. Vitamin D insufficiency Patient is on prescription vitamin D, and her last vitamin D level was not yet at goal.  She requests a refill today.  2. Stress Patient is in between jobs and her stress is high.  She is trying to be mindful of her food choices.  She is looking for a new job which will fit her skill sets and interests is better.  3. Polyphagia Patient is on Wegovy 1 mg once weekly.  Assessment/Plan:   1. Vitamin D insufficiency We will refill prescription vitamin D once weekly for 90 days.  We will recheck labs in 3 months.  2. Stress Patient was offered support and encouragement.  She is to make sure to work on decreasing emotional eating behavior.  3. Polyphagia Patient will continue Wegovy 1 mg once weekly, and we will refill for 90 days.  4. Obesity, Beginning BMI 30.34 We will refill  Wegovy 1 mg once weekly for 90 days.  (Prior authorization needed with new insurance).   5. Obesity, Current BMI 25.4 Sherri Cobb is currently in the action stage of change. As such, her goal is to continue with weight loss efforts. She has agreed to keeping a food journal and adhering to recommended goals of 1200 calories and 80+ grams of protein daily.   Exercise goals: As is.   Behavioral modification strategies: increasing lean protein intake, meal planning and cooking strategies, and keeping a strict food journal.  Sherri Cobb has agreed to follow-up with our clinic in 12 weeks. She was informed of the importance of frequent follow-up visits to maximize her success with intensive lifestyle modifications for her multiple health conditions.  Objective:   VITALS: Per patient if applicable, see vitals. GENERAL: Alert and in no acute distress. CARDIOPULMONARY: No increased WOB. Speaking in clear sentences.  PSYCH: Pleasant and cooperative. Speech normal rate and rhythm. Affect is appropriate. Insight and judgement are appropriate. Attention is focused, linear, and appropriate.  NEURO: Oriented as arrived to appointment on time with no prompting.   Lab Results  Component Value Date   CREATININE 0.88 04/01/2022   BUN 17 04/01/2022   NA 139 04/01/2022   K 5.1 04/01/2022   CL 106 04/01/2022   CO2 20 04/01/2022   Lab Results  Component Value Date   ALT 10 04/01/2022   AST 10 04/01/2022   ALKPHOS 36 (L) 04/01/2022   BILITOT 0.4  04/01/2022   Lab Results  Component Value Date   HGBA1C 5.2 04/01/2022   HGBA1C 5.0 12/09/2021   HGBA1C 5.1 06/03/2021   HGBA1C 5.1 11/13/2020   Lab Results  Component Value Date   INSULIN 10.9 04/01/2022   INSULIN 11.4 12/09/2021   INSULIN 10.9 06/03/2021   INSULIN 13.2 11/13/2020   Lab Results  Component Value Date   TSH 2.150 04/01/2022   Lab Results  Component Value Date   CHOL 162 04/01/2022   HDL 45 04/01/2022   LDLCALC 104 (H) 04/01/2022    TRIG 68 04/01/2022   Lab Results  Component Value Date   VD25OH 38.4 04/01/2022   VD25OH 46.7 12/09/2021   VD25OH 45.5 06/03/2021   Lab Results  Component Value Date   WBC 6.3 12/09/2021   HGB 13.9 12/09/2021   HCT 40.6 12/09/2021   MCV 86 12/09/2021   PLT 266 12/09/2021   Lab Results  Component Value Date   IRON 34 (L) 12/19/2020   TIBC 305 12/19/2020   FERRITIN 50 12/19/2020    Attestation Statements:   Reviewed by clinician on day of visit: allergies, medications, problem list, medical history, surgical history, family history, social history, and previous encounter notes.   I, Burt Knack, am acting as transcriptionist for Quillian Quince, MD.  I have reviewed the above documentation for accuracy and completeness, and I agree with the above. - Quillian Quince, MD

## 2022-09-02 NOTE — Progress Notes (Signed)
[facility] [address] [place] [address] Phone: [phone] Subjective:   Sherri Cobb, am serving as a scribe for Dr. Antoine Cobb.  I'm seeing this patient by the request  of:  Cobb, Sherri A, DO  CC: back and neck pain follow up   [id number]  Sherri Cobb is a 42 y.o. female coming in with complaint of back and neck pain. OMT on 06/03/2022. Patient states that her back is the same as last visit. Stretching and yoga helps her back pain. Nerve pain in L glute. Numbness occurs in that leg with prolonged standing.   Medications patient has been prescribed:   Taking:         Reviewed prior external information including notes and imaging from previsou exam, outside providers and external EMR if available.   As well as notes that were available from care everywhere and other healthcare systems.  Past medical history, social, surgical and family history all reviewed in electronic medical record.  No pertanent information unless stated regarding to the chief complaint.   Past Medical History:  Diagnosis Date   Allergies    Allergy    Back pain    Chicken pox    Depression 02/04/2022   Fatty liver    Gallbladder problem    Hx of vaginitis 12/10/2017   Recurrent yeast with Mirena IUD Reported history of uterine infection, but unable to find that in her Care Everywhere chart.   Lactose intolerance    Menometrorrhagia    Before BCPs   Migraines    Psoriasis     No Known Allergies   Review of Systems:  No headache, visual changes, nausea, vomiting, diarrhea, constipation, dizziness, abdominal pain, skin rash, fevers, chills, night sweats, weight loss, swollen lymph nodes, body aches, joint swelling, chest pain, shortness of breath, mood changes. POSITIVE muscle aches  Objective  Blood pressure 108/78, pulse 63, height 5\' 6"  (1.676 m), weight 158 lb (71.7 kg), SpO2 99%.   General: No apparent distress alert and oriented  x3 mood and affect normal, dressed appropriately.  HEENT: Pupils equal, extraocular movements intact  Respiratory: Patient's speak in full sentences and does not appear short of breath  Cardiovascular: No lower extremity edema, non tender, no erythema  MSK:  Back pain does have loss of lordosis send increase core strength noted.  Osteopathic findings  C4 flexed rotated and side bent left T3 extended rotated and side bent right inhaled rib L1 flexed rotated and side bent right L3 F RS left  Sacrum right on right    Assessment and Plan:  Hypermobility arthralgia Continues to have some difficulty with the arthralgia.  Patient is still also taking care of her young children.  And seems to be getting more difficulty sometimes.  Discussed with patient about icing regimen and home exercises.  Still continue to work on core strengthening.  Avoid excessive stretching.  If patient continues to have pain I would highly like to consider the possibility of Cymbalta or Effexor follow-up.    Nonallopathic problems  Decision today to treat with OMT was based on Physical Exam  After verbal consent patient was treated with HVLA, ME, FPR techniques in cervical, rib, thoracic, lumbar, and sacral  areas  Patient tolerated the procedure well with improvement in symptoms  Patient given exercises, stretches and lifestyle modifications  See medications in patient instructions if given  Patient will follow up in 4-8 weeks     The above documentation  has been reviewed and is accurate and complete Sherri Saa, DO         Note: This dictation was prepared with Dragon dictation along with smaller phrase technology. Any transcriptional errors that result from this process are unintentional.

## 2022-09-03 ENCOUNTER — Ambulatory Visit (INDEPENDENT_AMBULATORY_CARE_PROVIDER_SITE_OTHER): Payer: Medicaid Other | Admitting: Family Medicine

## 2022-09-03 ENCOUNTER — Encounter: Payer: Self-pay | Admitting: Family Medicine

## 2022-09-03 ENCOUNTER — Other Ambulatory Visit (HOSPITAL_COMMUNITY): Payer: Self-pay

## 2022-09-03 VITALS — BP 108/78 | HR 63 | Ht 66.0 in | Wt 158.0 lb

## 2022-09-03 DIAGNOSIS — M9904 Segmental and somatic dysfunction of sacral region: Secondary | ICD-10-CM

## 2022-09-03 DIAGNOSIS — M9901 Segmental and somatic dysfunction of cervical region: Secondary | ICD-10-CM

## 2022-09-03 DIAGNOSIS — M2550 Pain in unspecified joint: Secondary | ICD-10-CM | POA: Diagnosis not present

## 2022-09-03 DIAGNOSIS — M9908 Segmental and somatic dysfunction of rib cage: Secondary | ICD-10-CM

## 2022-09-03 DIAGNOSIS — M9903 Segmental and somatic dysfunction of lumbar region: Secondary | ICD-10-CM

## 2022-09-03 DIAGNOSIS — M9902 Segmental and somatic dysfunction of thoracic region: Secondary | ICD-10-CM | POA: Diagnosis not present

## 2022-09-03 NOTE — Assessment & Plan Note (Signed)
Continues to have some difficulty with the arthralgia.  Patient is still also taking care of her young children.  And seems to be getting more difficulty sometimes.  Discussed with patient about icing regimen and home exercises.  Still continue to work on core strengthening.  Avoid excessive stretching.  If patient continues to have pain I would highly like to consider the possibility of Cymbalta or Effexor follow-up.

## 2022-09-03 NOTE — Patient Instructions (Signed)
Good to see you I think we got good movement See me again in 8 weeks

## 2022-09-12 DIAGNOSIS — Z419 Encounter for procedure for purposes other than remedying health state, unspecified: Secondary | ICD-10-CM | POA: Diagnosis not present

## 2022-09-21 ENCOUNTER — Telehealth (INDEPENDENT_AMBULATORY_CARE_PROVIDER_SITE_OTHER): Payer: Self-pay | Admitting: *Deleted

## 2022-09-21 NOTE — Telephone Encounter (Signed)
Paperwork completed and faxed to [employer].

## 2022-09-21 NOTE — Telephone Encounter (Signed)
Onalee Hua, representative from [facility] called to get more information on the prior authorization faxed to their office. He requested patient's last and starting BMI. Information was given to Onalee Hua and no further information needed at this time.

## 2022-09-22 NOTE — Telephone Encounter (Signed)
Received fax from [employer], [place] for Sherri Cobb has been denied. Patient has been sent a Mychart message with this information.

## 2022-10-12 DIAGNOSIS — Z419 Encounter for procedure for purposes other than remedying health state, unspecified: Secondary | ICD-10-CM | POA: Diagnosis not present

## 2022-10-15 ENCOUNTER — Ambulatory Visit (INDEPENDENT_AMBULATORY_CARE_PROVIDER_SITE_OTHER): Payer: Medicaid Other | Admitting: Family Medicine

## 2022-10-15 ENCOUNTER — Other Ambulatory Visit: Payer: Self-pay

## 2022-10-15 VITALS — BP 112/80 | HR 84 | Temp 98.4°F | Ht 65.0 in | Wt 164.4 lb

## 2022-10-15 DIAGNOSIS — Z6827 Body mass index (BMI) 27.0-27.9, adult: Secondary | ICD-10-CM | POA: Diagnosis not present

## 2022-10-15 DIAGNOSIS — E663 Overweight: Secondary | ICD-10-CM | POA: Insufficient documentation

## 2022-10-15 DIAGNOSIS — Z23 Encounter for immunization: Secondary | ICD-10-CM

## 2022-10-15 DIAGNOSIS — Z0000 Encounter for general adult medical examination without abnormal findings: Secondary | ICD-10-CM | POA: Diagnosis not present

## 2022-10-15 DIAGNOSIS — E538 Deficiency of other specified B group vitamins: Secondary | ICD-10-CM

## 2022-10-15 DIAGNOSIS — Z6828 Body mass index (BMI) 28.0-28.9, adult: Secondary | ICD-10-CM | POA: Insufficient documentation

## 2022-10-15 DIAGNOSIS — E611 Iron deficiency: Secondary | ICD-10-CM | POA: Diagnosis not present

## 2022-10-15 DIAGNOSIS — K760 Fatty (change of) liver, not elsewhere classified: Secondary | ICD-10-CM | POA: Insufficient documentation

## 2022-10-15 DIAGNOSIS — Z1231 Encounter for screening mammogram for malignant neoplasm of breast: Secondary | ICD-10-CM

## 2022-10-15 LAB — COMPREHENSIVE METABOLIC PANEL
ALT: 12 U/L (ref 0–35)
AST: 12 U/L (ref 0–37)
Albumin: 4.4 g/dL (ref 3.5–5.2)
Alkaline Phosphatase: 38 U/L — ABNORMAL LOW (ref 39–117)
BUN: 14 mg/dL (ref 6–23)
CO2: 29 meq/L (ref 19–32)
Calcium: 9.7 mg/dL (ref 8.4–10.5)
Chloride: 103 meq/L (ref 96–112)
Creatinine, Ser: 0.88 mg/dL (ref 0.40–1.20)
GFR: 82.58 mL/min (ref >60.00)
Glucose, Bld: 77 mg/dL (ref 70–99)
Potassium: 4.7 meq/L (ref 3.5–5.1)
Sodium: 138 meq/L (ref 135–145)
Total Bilirubin: 0.5 mg/dL (ref 0.2–1.2)
Total Protein: 7.4 g/dL (ref 6.0–8.3)

## 2022-10-15 LAB — CBC
HCT: 42.3 % (ref 36.0–46.0)
Hemoglobin: 13.9 g/dL (ref 12.0–15.0)
MCHC: 32.9 g/dL (ref 30.0–36.0)
MCV: 88.3 fL (ref 78.0–100.0)
Platelets: 296 10*3/uL (ref 150.0–400.0)
RBC: 4.79 Mil/uL (ref 3.87–5.11)
RDW: 13.1 % (ref 11.5–15.5)
WBC: 5.4 10*3/uL (ref 4.0–10.5)

## 2022-10-15 LAB — VITAMIN B12: Vitamin B-12: 261 pg/mL (ref 211–911)

## 2022-10-15 LAB — IBC + FERRITIN
Ferritin: 48.3 ng/mL (ref 10.0–291.0)
Iron: 109 ug/dL (ref 42–145)
Saturation Ratios: 29.7 % (ref 20.0–50.0)
TIBC: 366.8 ug/dL (ref 250.0–450.0)
Transferrin: 262 mg/dL (ref 212.0–360.0)

## 2022-10-15 MED ORDER — BUPROPION HCL ER (XL) 150 MG PO TB24
150.0000 mg | ORAL_TABLET | Freq: Every day | ORAL | 1 refills | Status: DC
  Filled 2022-10-15: qty 90, 90d supply, fill #0

## 2022-10-15 MED ORDER — NALTREXONE HCL 50 MG PO TABS
25.0000 mg | ORAL_TABLET | Freq: Two times a day (BID) | ORAL | 1 refills | Status: DC
  Filled 2022-10-15: qty 90, 90d supply, fill #0

## 2022-10-15 NOTE — Progress Notes (Signed)
Patient ID: Sherri Cobb, female  DOB: May 03, 1980, 42 y.o.   MRN: [id number] Patient Care Team    Relationship Specialty Notifications Start End  Natalia Leatherwood, DO PCP - General Family Medicine  11/14/20   Roswell Nickel, DO Consulting Physician Bariatrics  11/14/20   Bjorn Pippin, MD Consulting Physician Orthopedic Surgery  11/14/20   [facility] Nurse Practitioner Gynecology  11/14/20    Comment: Eveline Keto, NP    Chief Complaint  Patient presents with   Annual Exam    Subjective: Sherri Cobb is a 42 y.o.  Female  present for CPE. All past medical history, surgical history, allergies, family history, immunizations, medications and social history were updated in the electronic medical record today. All recent labs, ED visits and hospitalizations within the last year were reviewed.  Health maintenance:  Colonoscopy:  no fhx. Routine screen at 47 Mammogram: no fhx, routine screen 40> ordered lx [facility] Cervical cancer screening: last pap: 07/17/2020 Tiburcio Pea, NP - gyn Immunizations: tdap utd 2016, Influenza completed today (encouraged yearly), covid utd Infectious disease screening: HIV and Hep C collected today DEXA: routine screen 60-65 Assistive device: none Oxygen NUU:VOZD Patient has a Dental home. Hospitalizations/ED visits: reviewed      12/19/2020   10:29 AM 11/13/2020    8:29 AM 09/15/2017    9:55 AM  Depression screen PHQ 2/9  Decreased Interest 0 2 0  Down, Depressed, Hopeless 0 1 0  PHQ - 2 Score 0 3 0  Altered sleeping  2 0  Tired, decreased energy  3 3  Change in appetite  1 0  Feeling bad or failure about yourself   2 0  Trouble concentrating  1 0  Moving slowly or fidgety/restless  0 0  Suicidal thoughts  0 0  PHQ-9 Score  12 3  Difficult doing work/chores  Somewhat difficult        No data to display           Immunization History  Administered Date(s) Administered   Influenza-Unspecified 12/10/2017, 09/14/2018,  10/11/2020   PFIZER(Purple Top)SARS-COV-2 Vaccination 03/23/2019, 04/16/2019, 12/15/2019   Pfizer Covid-19 Vaccine Bivalent Booster 20yrs & up 11/02/2020   Tdap 01/11/2014   Past Medical History:  Diagnosis Date   Allergies    Allergy    Back pain    Chicken pox    Depression 02/04/2022   Fatty liver    Gallbladder problem    Hx of vaginitis 12/10/2017   Recurrent yeast with Mirena IUD Reported history of uterine infection, but unable to find that in her Care Everywhere chart.   Insulin resistance 12/09/2021   Lactose intolerance    Menometrorrhagia    Before BCPs   Migraines    Psoriasis    No Known Allergies Past Surgical History:  Procedure Laterality Date   CHOLECYSTECTOMY N/A 07/12/2020   Procedure: LAPAROSCOPIC CHOLECYSTECTOMY;  Surgeon: Andria Meuse, MD;  Location: [facility];  Service: General;  Laterality: N/A;   KNEE ARTHROSCOPY WITH LATERAL RELEASE Right 12/27/2019   Procedure: KNEE ARTHROSCOPY WITH LATERAL RELEASE; REMOVAL LOOSE FOREIGN BODY; DEBRIDEMENT/SHAVING CHONDROPLASTY;  Surgeon: Bjorn Pippin, MD;  Location: [facility];  Service: Orthopedics;  Laterality: Right;   LASIK Bilateral 2016   TIBIAL TUBERCLERPLASTY Right 12/27/2019   Procedure: LIGAMENT RECONSTRUCTION KNEE EXTRA-ARTICULAR, ANTERIOR TIBIAL TUBERCLEPLASTY, DECOMPRESSION FASCIOTOMY, LEG ANTERIOR;  Surgeon: Bjorn Pippin, MD;  Location: [facility];  Service: Orthopedics;  Laterality: Right;  WISDOM TOOTH EXTRACTION     Family History  Problem Relation Age of Onset   Hypertension Mother    Hyperlipidemia Mother    Anxiety disorder Mother    Sleep apnea Mother    Thyroid disease Father    Depression Father    Gout Father    Kidney Stones Father    Miscarriages / Stillbirths Sister    Hypertension Maternal Grandmother    Depression Maternal Grandmother    Skin cancer Maternal Grandmother    Diverticulitis Maternal Grandmother    Anxiety disorder Daughter     Depression Daughter    ADD / ADHD Daughter    Social History   Social History Narrative   Marital status/children/pets: Single/Div., 2 daughters   Education/employment: associates degree, administrative work   Field seismologist:      -smoke alarm in the home:Yes     - wears seatbelt: Yes     - Feels safe in their relationships: Yes       Allergies as of 10/15/2022   No Known Allergies      Medication List        Accurate as of October 15, 2022  1:35 PM. If you have any questions, ask your nurse or doctor.          STOP taking these medications    Wegovy 1 MG/0.5ML Soaj Generic drug: Semaglutide-Weight Management Stopped by: Felix Pacini       TAKE these medications    buPROPion 150 MG 24 hr tablet Commonly known as: Wellbutrin XL Take 1 tablet (150 mg total) by mouth daily. Started by: Felix Pacini   fluocinonide ointment 0.05 % Commonly known as: LIDEX Apply 1 application topically 2 (two) times daily. What changed:  when to take this reasons to take this   magnesium 30 MG tablet Take 30 mg by mouth 2 (two) times daily.   multivitamin with minerals Tabs tablet Take 1 tablet by mouth daily.   naltrexone 50 MG tablet Commonly known as: DEPADE Take 0.5 tablets (25 mg total) by mouth 2 (two) times daily. Started by: Felix Pacini   Turmeric 400 MG Caps Take by mouth.   VITAMIN B-12 PO Take by mouth daily.   Vitamin D (Ergocalciferol) 1.25 MG (50000 UNIT) Caps capsule Commonly known as: DRISDOL Take 1 capsule (50,000 Units total) by mouth every 7 (seven) days.        All past medical history, surgical history, allergies, family history, immunizations andmedications were updated in the EMR today and reviewed under the history and medication portions of their EMR.        ROS 14 pt review of systems performed and negative (unless mentioned in an HPI)  Objective BP 112/80   Pulse 84   Temp 98.4 F (36.9 C)   Ht 5\' 5"  (1.651 m)   Wt 164 lb 6.4 oz  (74.6 kg)   BMI 27.36 kg/m  Physical Exam Vitals and nursing note reviewed.  Constitutional:      General: She is not in acute distress.    Appearance: Normal appearance. She is not ill-appearing or toxic-appearing.  HENT:     Head: Normocephalic and atraumatic.     Right Ear: Tympanic membrane, ear canal and external ear normal. There is no impacted cerumen.     Left Ear: Tympanic membrane, ear canal and external ear normal. There is no impacted cerumen.     Nose: No congestion or rhinorrhea.     Mouth/Throat:     Mouth: Mucous  membranes are moist.     Pharynx: Oropharynx is clear. No oropharyngeal exudate or posterior oropharyngeal erythema.  Eyes:     General: No scleral icterus.       Right eye: No discharge.        Left eye: No discharge.     Extraocular Movements: Extraocular movements intact.     Conjunctiva/sclera: Conjunctivae normal.     Pupils: Pupils are equal, round, and reactive to light.  Cardiovascular:     Rate and Rhythm: Normal rate and regular rhythm.     Pulses: Normal pulses.     Heart sounds: Normal heart sounds. No murmur heard.    No friction rub. No gallop.  Pulmonary:     Effort: Pulmonary effort is normal. No respiratory distress.     Breath sounds: Normal breath sounds. No stridor. No wheezing, rhonchi or rales.  Chest:     Chest wall: No tenderness.  Abdominal:     General: Abdomen is flat. Bowel sounds are normal. There is no distension.     Palpations: Abdomen is soft. There is no mass.     Tenderness: There is no abdominal tenderness. There is no right CVA tenderness, left CVA tenderness, guarding or rebound.     Hernia: No hernia is present.  Musculoskeletal:        General: No swelling, tenderness or deformity. Normal range of motion.     Cervical back: Normal range of motion and neck supple. No rigidity or tenderness.     Right lower leg: No edema.     Left lower leg: No edema.  Lymphadenopathy:     Cervical: No cervical adenopathy.   Skin:    General: Skin is warm and dry.     Coloration: Skin is not jaundiced or pale.     Findings: No bruising, erythema, lesion or rash.  Neurological:     General: No focal deficit present.     Mental Status: She is alert and oriented to person, place, and time. Mental status is at baseline.     Cranial Nerves: No cranial nerve deficit.     Sensory: No sensory deficit.     Motor: No weakness.     Coordination: Coordination normal.     Gait: Gait normal.     Deep Tendon Reflexes: Reflexes normal.  Psychiatric:        Mood and Affect: Mood normal.        Behavior: Behavior normal.        Thought Content: Thought content normal.        Judgment: Judgment normal.      No results found.  Assessment/plan: DEBI COUSIN is a 42 y.o. female present for CPE   Encounter for general adult medical examination with abnormal findings Patient was encouraged to exercise greater than 150 minutes a week. Patient was encouraged to choose a diet filled with fresh fruits and vegetables, and lean meats. AVS provided to patient today for education/recommendation on gender specific health and safety maintenance. Colonoscopy:  no fhx. Routine screen at 45 Mammogram: no fhx, routine screen 40> ordered lx [facility] Cervical cancer screening: last pap: 07/17/2020 Tiburcio Pea, NP - gyn Immunizations: tdap utd 2016, Influenza completed today (encouraged yearly), covid utd Infectious disease screening: HIV and Hep C collected today DEXA: routine screen 60-65  Overweight: Discussed meds today.  Had been on Wegovy, lost a good deal of weight and ended up with a BMI closer to 25, was above 30.  Insurance stopped covering  Wegovy.  She has slowly started to regain weight.  She is exercising and dieting. Start Wellbutrin 150 Start Depade 25 mg twice daily  Return in about 4 weeks (around 11/12/2022) for adhd and weight follow up.   Orders Placed This Encounter  Procedures   MM 3D SCREENING MAMMOGRAM BILATERAL  BREAST   CBC   Comprehensive metabolic panel   IBC + Ferritin   Vitamin B12    Meds ordered this encounter  Medications   buPROPion (WELLBUTRIN XL) 150 MG 24 hr tablet    Sig: Take 1 tablet (150 mg total) by mouth daily.    Dispense:  90 tablet    Refill:  1   naltrexone (DEPADE) 50 MG tablet    Sig: Take 0.5 tablets (25 mg total) by mouth 2 (two) times daily.    Dispense:  90 tablet    Refill:  1    Referral Orders  No referral(s) requested today     Electronically signed by: Felix Pacini, DO [facility]

## 2022-10-15 NOTE — Addendum Note (Signed)
Addended by: Filomena Jungling on: 10/15/2022 02:20 PM   Modules accepted: Orders

## 2022-10-15 NOTE — Patient Instructions (Addendum)
Return in about 4 weeks (around 11/12/2022) for adhd and weight follow up.  If desiring to discuss ADHD medications and start, we would need to complete this on a separate appointment from your Physical/preventive health appt.. Please schedule at your earliest convenience.  Would recommend appt in 4 weeks , so we can also follow up on new med start from today        Great to see you today.  I have refilled the medication(s) we provide.   If labs were collected or images ordered, we will inform you of  results once we have received them and reviewed. We will contact you either by echart message, or telephone call.  Please give ample time to the testing facility, and our office to run,  receive and review results. Please do not call inquiring of results, even if you can see them in your chart. We will contact you as soon as we are able. If it has been over 1 week since the test was completed, and you have not yet heard from [place], then please call us.    - echart message- for normal results that have been seen by the patient already.   - telephone call: abnormal results or if patient has not viewed results in their echart.  If a referral to a specialist was entered for you, please call us in 2 weeks if you have not heard from the specialist office to schedule.

## 2022-10-27 NOTE — Progress Notes (Unsigned)
[name] [address] [place] [address] Phone: [phone] Subjective:   [name], am serving as a scribe for Dr. Antoine Primas.  I'm seeing this patient by the request  of:  Kuneff, Renee A, DO  CC: Left hip and back pain follow-up  [id number]  Sherri Cobb is a 42 y.o. female coming in with complaint of back and neck pain. OMT on 09/03/2022. Patient states that she is the same as last visit. Has tried acupuncture and cupping which was helpful temporarily. She is having hard time sleeping on that side.   Medications patient has been prescribed:   Taking:         Reviewed prior external information including notes and imaging from previsou exam, outside providers and external EMR if available.   As well as notes that were available from care everywhere and other healthcare systems.  Past medical history, social, surgical and family history all reviewed in electronic medical record.  No pertanent information unless stated regarding to the chief complaint.   Past Medical History:  Diagnosis Date   Allergies    Allergy    Back pain    Chicken pox    Depression 02/04/2022   Fatty liver    Gallbladder problem    Hx of vaginitis 12/10/2017   Recurrent yeast with Mirena IUD Reported history of uterine infection, but unable to find that in her Care Everywhere chart.   Insulin resistance 12/09/2021   Lactose intolerance    Menometrorrhagia    Before BCPs   Migraines    Psoriasis     No Known Allergies   Review of Systems:  No headache, visual changes, nausea, vomiting, diarrhea, constipation, dizziness, abdominal pain, skin rash, fevers, chills, night sweats, weight loss, swollen lymph nodes, body aches, joint swelling, chest pain, shortness of breath, mood changes. POSITIVE muscle aches  Objective  Blood pressure 102/78, pulse 85, height 5\' 5"  (1.651 m), weight 165 lb (74.8 kg), SpO2 98%.   General: No apparent  distress alert and oriented x3 mood and affect normal, dressed appropriately.  HEENT: Pupils equal, extraocular movements intact  Respiratory: Patient's speak in full sentences and does not appear short of breath  Cardiovascular: No lower extremity edema, non tender, no erythema  Increased discomfort over the left piriformis area.  Good range of motion noted and actually hypermobility noted.  Osteopathic findings  C2 flexed rotated and side bent right C7 flexed rotated and side bent left T3 extended rotated and side bent right inhaled rib T7 extended rotated and side bent left L2 flexed rotated and side bent right Sacrum right on right       Assessment and Plan:  Hypermobility arthralgia Patient will continue to work on strengthening.  Do believe some weakness noted in hip abduction that could be contributing to more of a piriformis syndrome.  Started on gabapentin for any lumbar radiculopathy or sciatic nerve impingement that could be potentially contributing.  Discussed icing regimen and home exercises otherwise.  Increase activity slowly.  Follow-up again 6 to 8 weeks    Nonallopathic problems  Decision today to treat with OMT was based on Physical Exam  After verbal consent patient was treated with HVLA, ME, FPR techniques in cervical, rib, thoracic, lumbar, and sacral  areas  Patient tolerated the procedure well with improvement in symptoms  Patient given exercises, stretches and lifestyle modifications  See medications in patient instructions if given  Patient will follow up in 4-8  weeks     The above documentation has been reviewed and is accurate and complete [name], DO         Note: This dictation was prepared with Dragon dictation along with smaller phrase technology. Any transcriptional errors that result from this process are unintentional.

## 2022-10-28 ENCOUNTER — Encounter: Payer: Self-pay | Admitting: Family Medicine

## 2022-10-28 ENCOUNTER — Ambulatory Visit: Payer: Medicaid Other | Admitting: Family Medicine

## 2022-10-28 ENCOUNTER — Other Ambulatory Visit: Payer: Self-pay

## 2022-10-28 VITALS — BP 102/78 | HR 85 | Ht 65.0 in | Wt 165.0 lb

## 2022-10-28 DIAGNOSIS — M9903 Segmental and somatic dysfunction of lumbar region: Secondary | ICD-10-CM

## 2022-10-28 DIAGNOSIS — M9902 Segmental and somatic dysfunction of thoracic region: Secondary | ICD-10-CM

## 2022-10-28 DIAGNOSIS — M9904 Segmental and somatic dysfunction of sacral region: Secondary | ICD-10-CM

## 2022-10-28 DIAGNOSIS — M2550 Pain in unspecified joint: Secondary | ICD-10-CM

## 2022-10-28 DIAGNOSIS — M9908 Segmental and somatic dysfunction of rib cage: Secondary | ICD-10-CM

## 2022-10-28 DIAGNOSIS — M9901 Segmental and somatic dysfunction of cervical region: Secondary | ICD-10-CM

## 2022-10-28 MED ORDER — GABAPENTIN 100 MG PO CAPS
200.0000 mg | ORAL_CAPSULE | Freq: Every day | ORAL | 0 refills | Status: AC
  Filled 2022-10-28: qty 180, 90d supply, fill #0

## 2022-10-28 NOTE — Assessment & Plan Note (Signed)
Patient will continue to work on strengthening.  Do believe some weakness noted in hip abduction that could be contributing to more of a piriformis syndrome.  Started on gabapentin for any lumbar radiculopathy or sciatic nerve impingement that could be potentially contributing.  Discussed icing regimen and home exercises otherwise.  Increase activity slowly.  Follow-up again 6 to 8 weeks

## 2022-10-28 NOTE — Patient Instructions (Signed)
Do prescribed exercises at least 3x a week Gabapentin 200mg  See you again in 6-8 weeks

## 2022-10-31 IMAGING — US US ABDOMEN LIMITED
1 series · 15 of 25 positions shown · non-contrast
Comparison: CT Abdomen and Pelvis 2668 hours.

CLINICAL DATA: 39-year-old female with epigastric pain. Gallstone
with possible mild gallbladder inflammation on CT Abdomen and Pelvis
earlier today.

EXAM:
ULTRASOUND ABDOMEN LIMITED RIGHT UPPER QUADRANT

[Series 1: us abdomen limited mc & wl · 56 acquisitions, 15 frames shown]
[im 1/56]
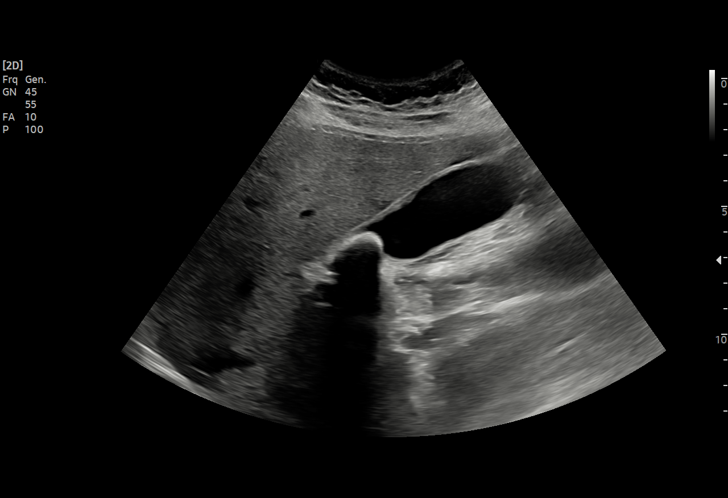
[im 5/56]
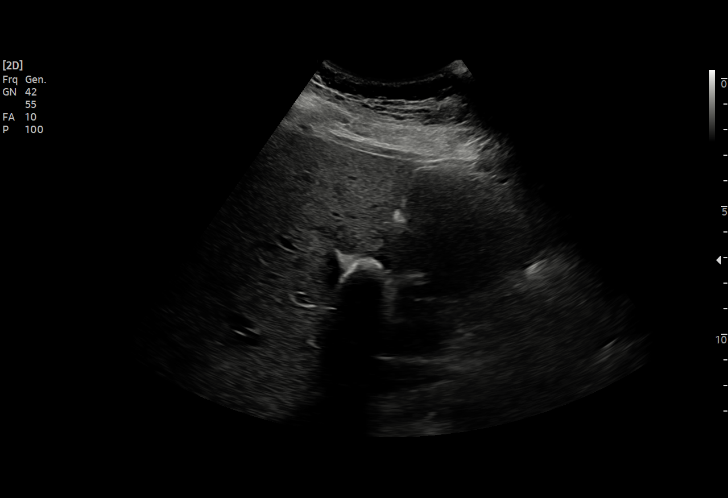
[im 10/56]
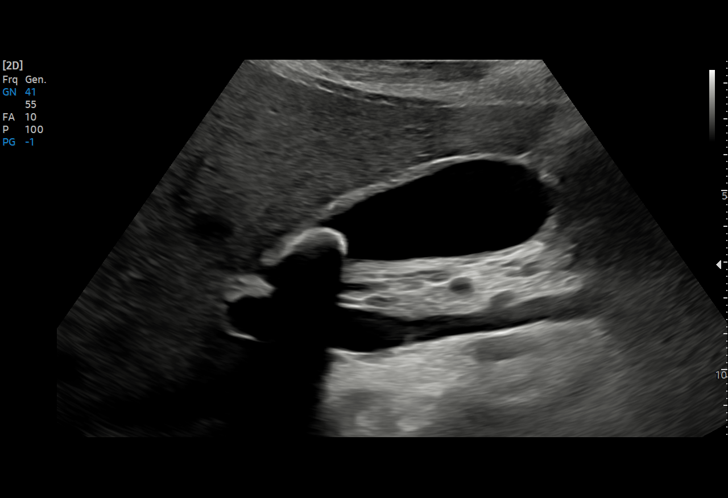
[im 12/56]
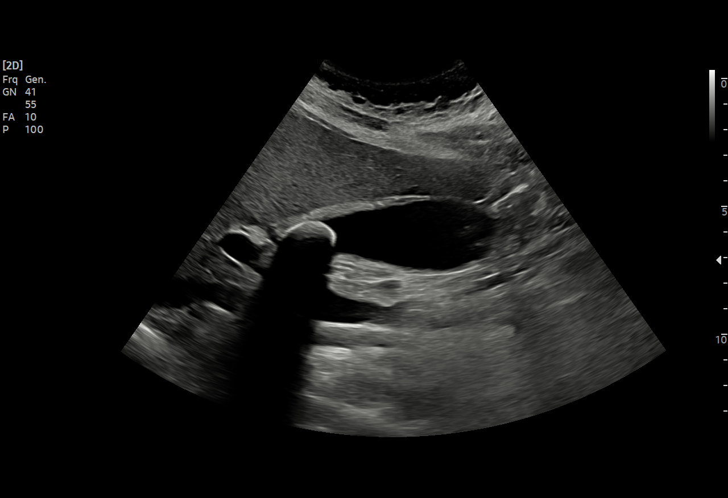
[im 17/56]
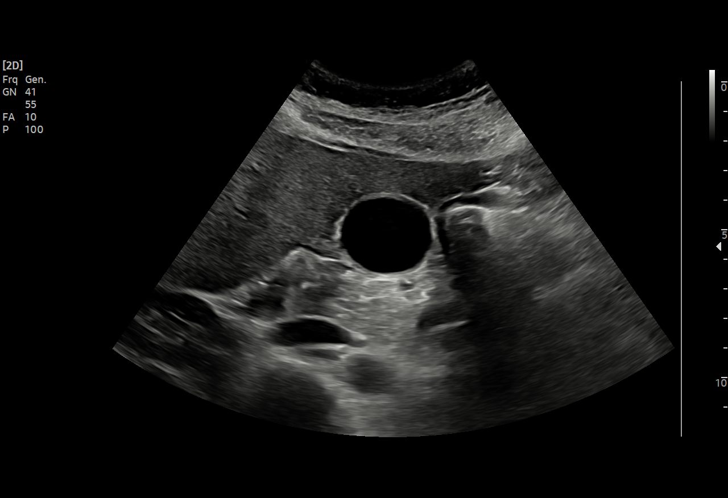
[im 21/56]
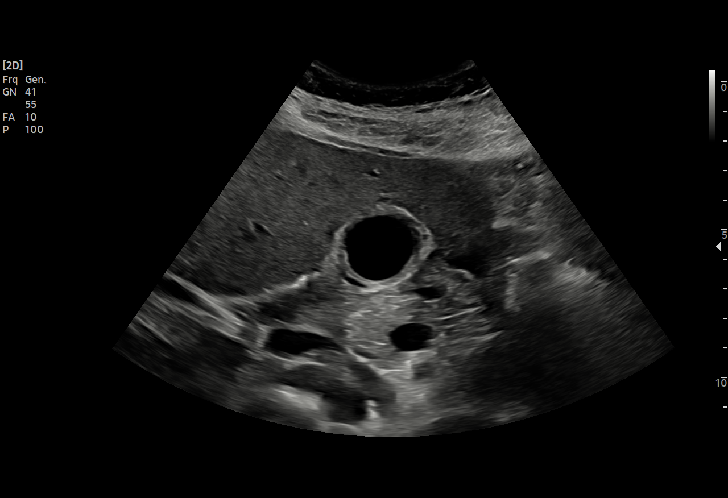
[im 23/56]
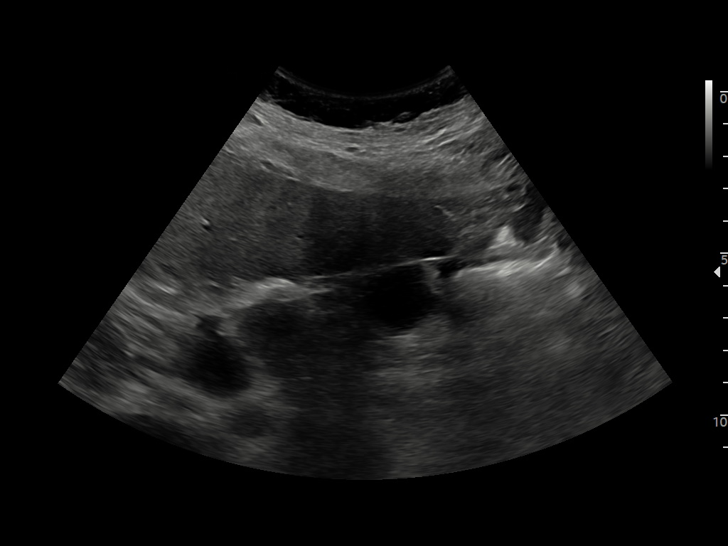
[im 28/56]
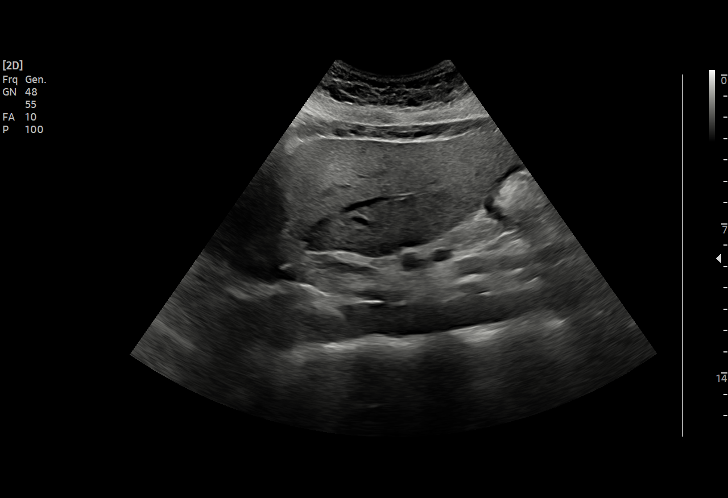
[im 33/56]
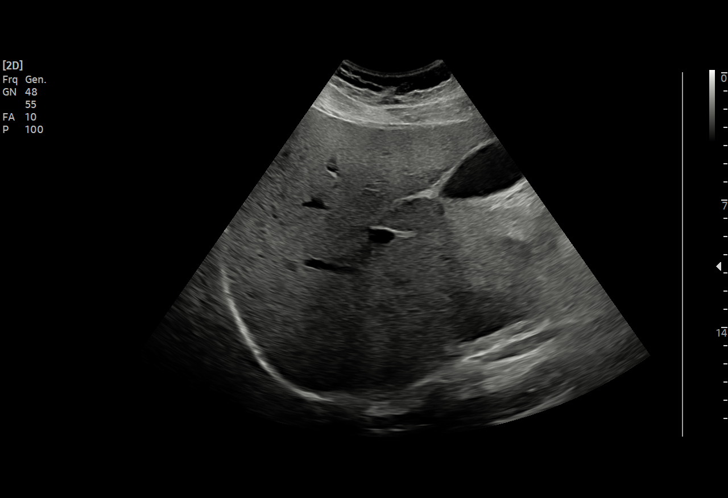
[im 35/56]
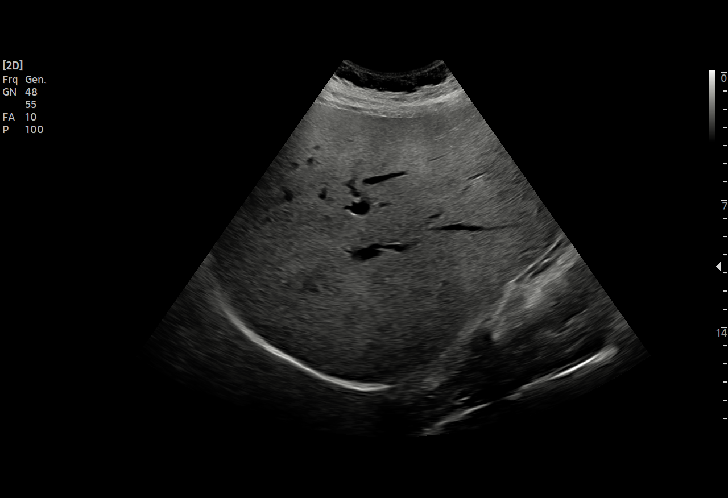
[im 39/56]
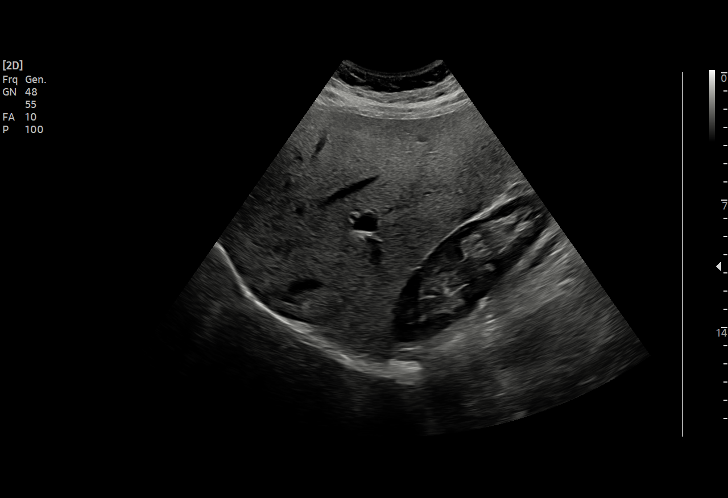
[im 44/56]
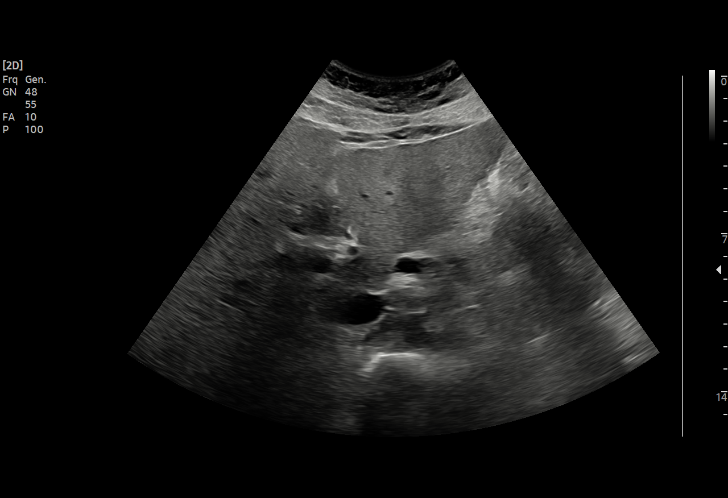
[im 46/56]
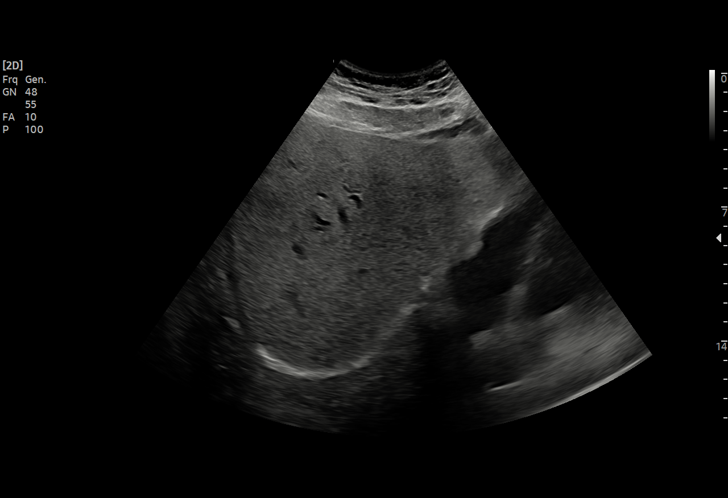
[im 51/56]
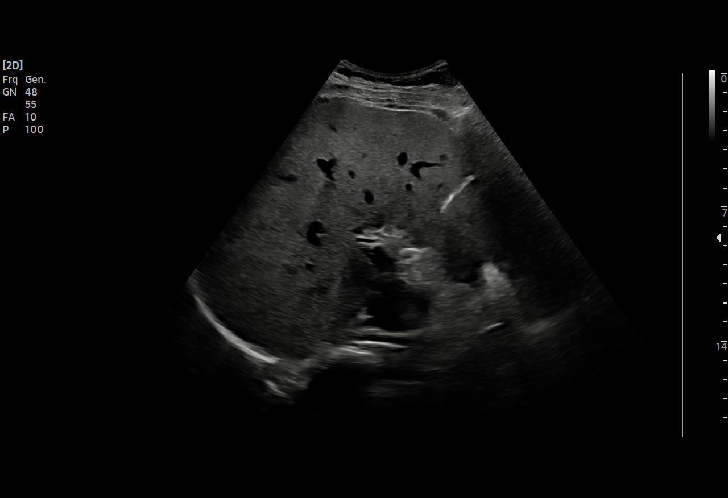
[im 56/56]
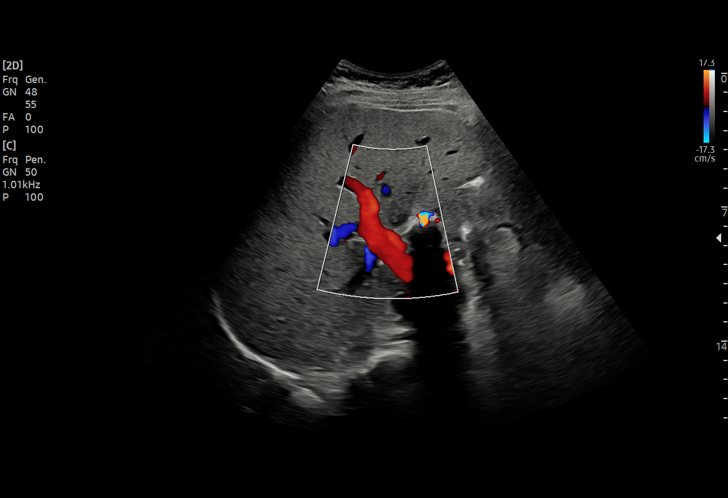

[15 of 25 positions shown; findings below may reference images not displayed]

FINDINGS: Gallbladder:

Shadowing 2.2 cm gallstone in the neck of the gallbladder (image 2).
There is mild-to-moderate circumferential wall thickening at the
body of the gallbladder (image 22). Wall thickness at the fundus
appears to remain normal. No pericholecystic fluid identified. No
sonographic Murphy sign elicited.

Common bile duct:

Diameter: 2 mm, normal.

Liver:

Echogenic liver (image 38). No intrahepatic biliary ductal
dilatation or discrete liver lesion. Portal vein is patent on color
Doppler imaging with normal direction of blood flow towards the
liver.

Other: Negative right kidney.
IMPRESSION: 1. Gallstone measuring 2.2 cm in the neck of the gallbladder is
associated with circumferential wall thickening in the body of the
gallbladder. Despite absent sonographic Murphy's sign suspect Early
Acute Cholecystitis.
2. No evidence of bile duct obstruction.
3. Fatty liver disease.

## 2022-10-31 IMAGING — CT CT ABD-PELV W/O CM
2 of 4 series · 16 of 46 positions shown, 18 images · non-contrast
Comparison: None.

CLINICAL DATA: Upper epigastric pain starting 2 hours ago.

EXAM:
CT ABDOMEN AND PELVIS WITHOUT CONTRAST
TECHNIQUE: Multidetector CT imaging of the abdomen and pelvis was performed
following the standard protocol without IV contrast.

[Series 2: axial st · axial · 0.91mm/px · z∈[+1058,+1493]mm · 13 of 99 slices shown, 15 images]
[im 6/99  soft-tissue]
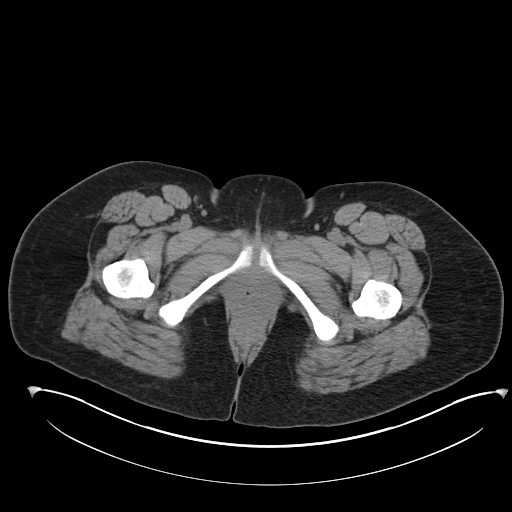
[im 6/99  bone]
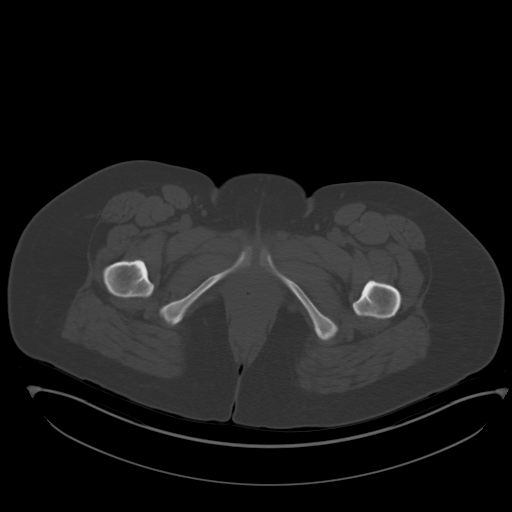
[im 11/99  soft-tissue]
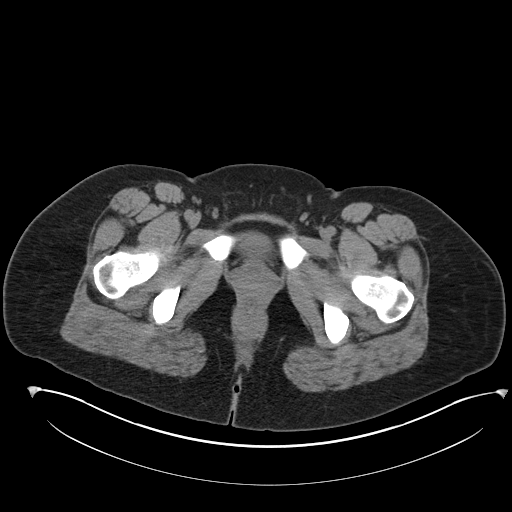
[im 22/99  soft-tissue]
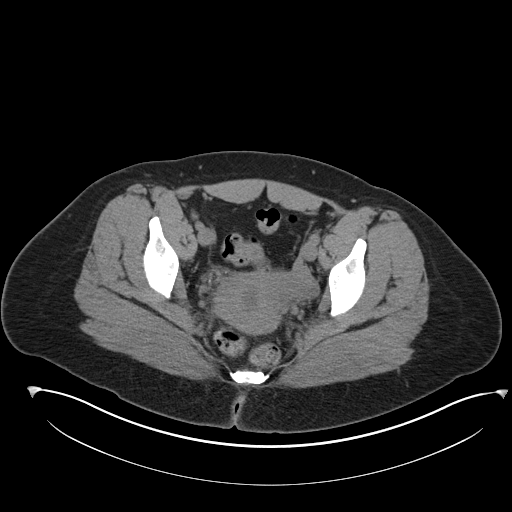
[im 28/99  soft-tissue]
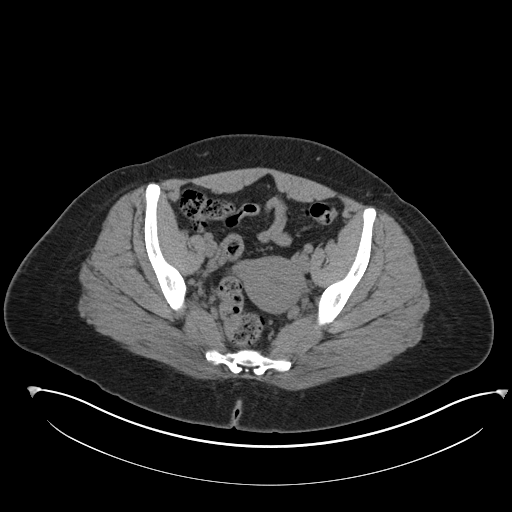
[im 33/99  soft-tissue]
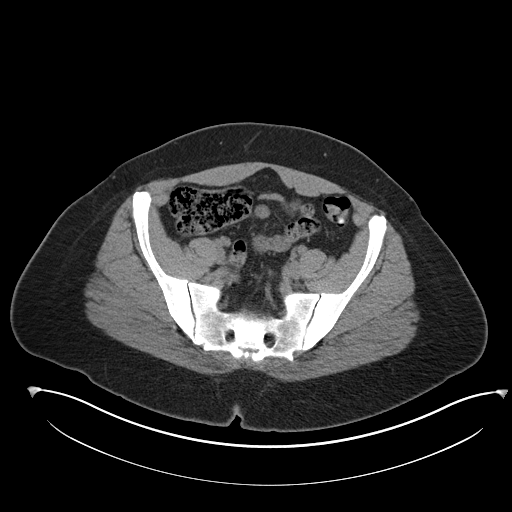
[im 44/99  soft-tissue]
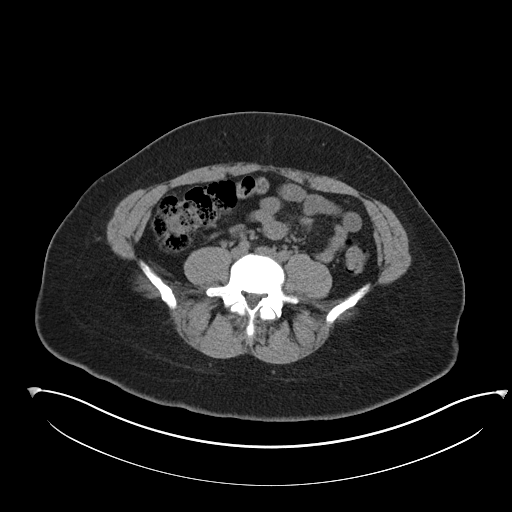
[im 50/99  soft-tissue]
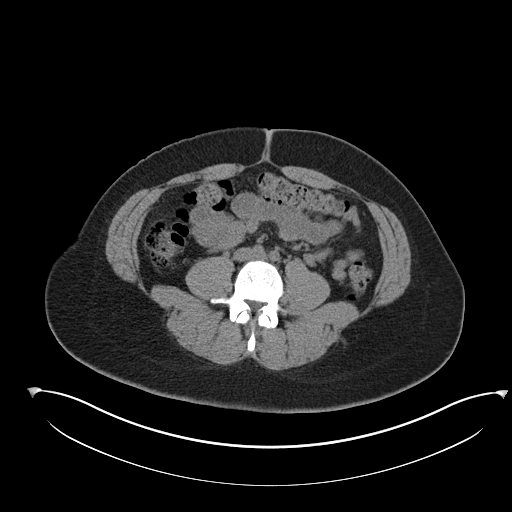
[im 55/99  soft-tissue]
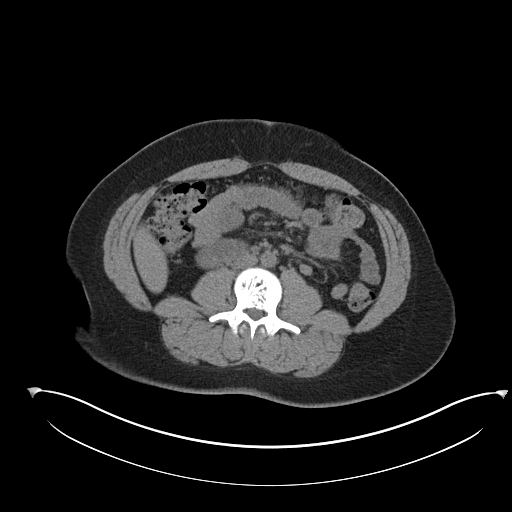
[im 66/99  soft-tissue]
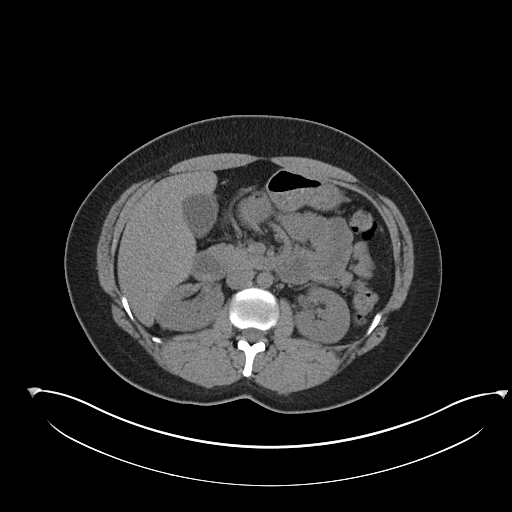
[im 66/99  bone]
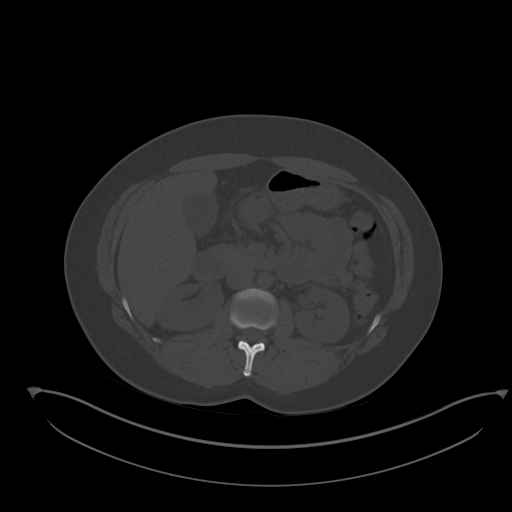
[im 71/99  soft-tissue]
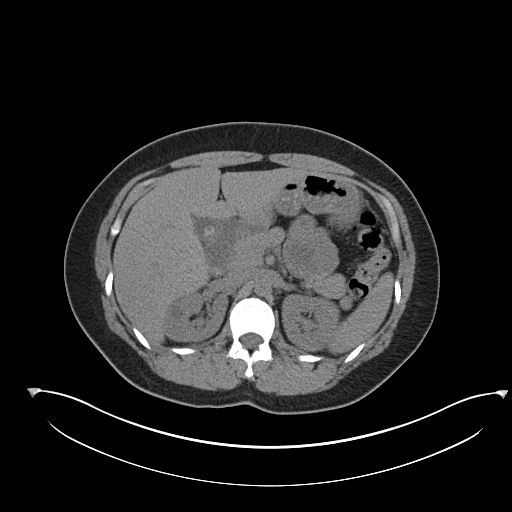
[im 77/99  soft-tissue]
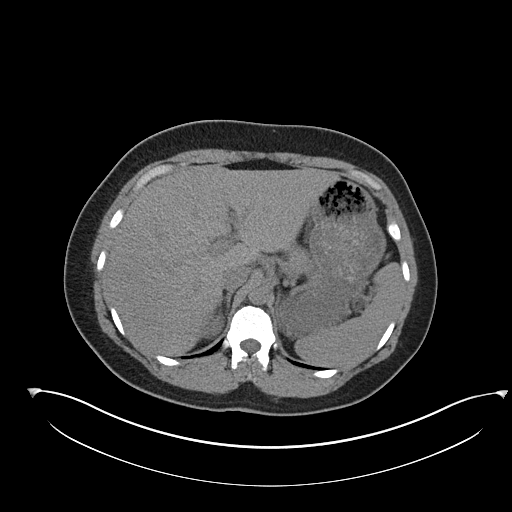
[im 88/99  soft-tissue]
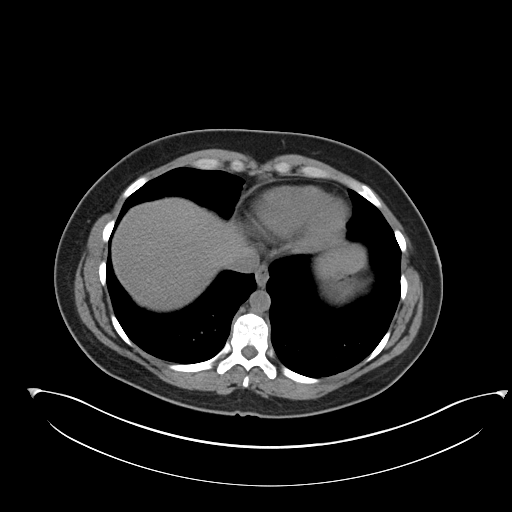
[im 93/99  soft-tissue]
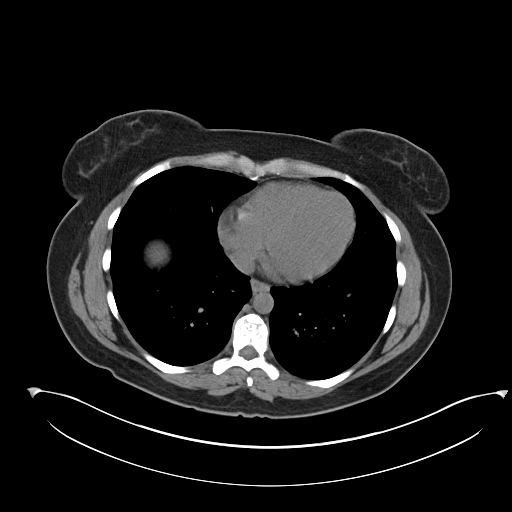

[Series 5: coronal st · coronal · 0.76mm/px · 3 of 151 slices shown]
[im 51/151  soft-tissue]
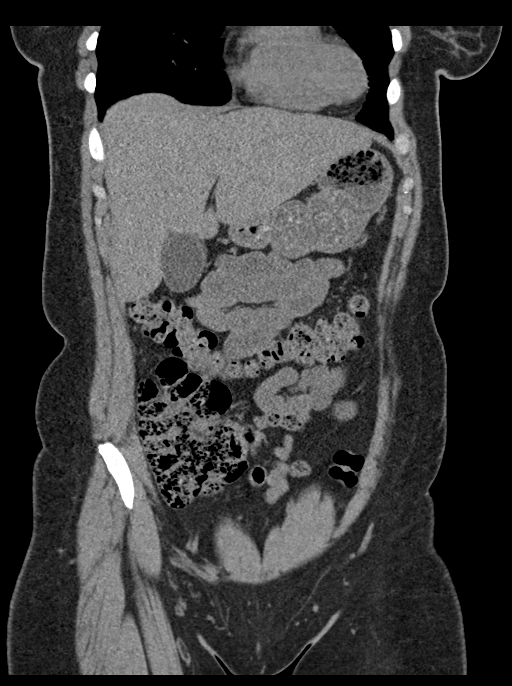
[im 67/151  soft-tissue]
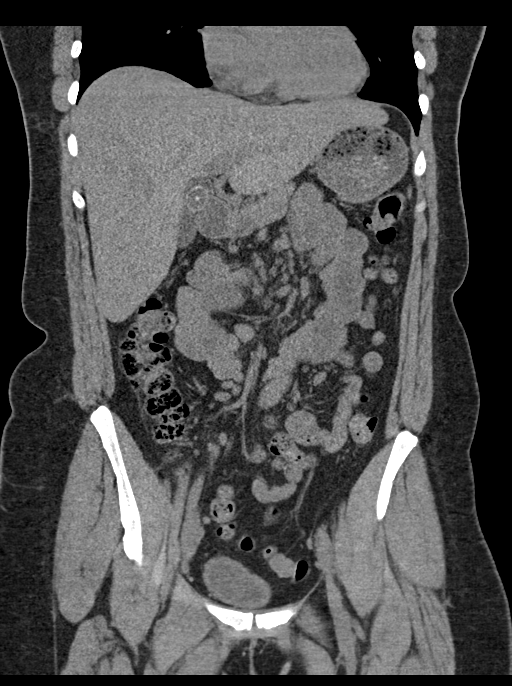
[im 84/151  soft-tissue]
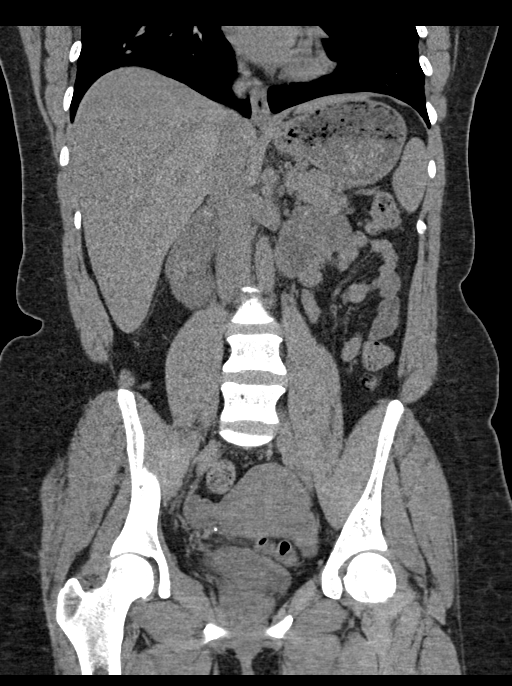

[16 of 46 positions shown; findings below may reference images not displayed]

FINDINGS: Lower chest: Lung bases are clear.

Hepatobiliary: No focal liver lesions. Cholelithiasis with large
stone in the gallbladder. Minimal pericholecystic stranding could
indicate acute cholecystitis. No bile duct dilatation.

Pancreas: Unremarkable. No pancreatic ductal dilatation or
surrounding inflammatory changes.

Spleen: Normal in size without focal abnormality.

Adrenals/Urinary Tract: No adrenal gland nodules. Kidneys are
symmetrical. No hydronephrosis or hydroureter. No renal stones
identified. Bladder is unremarkable.

Stomach/Bowel: Stomach, small bowel, and colon are not abnormally
distended. No wall thickening or inflammatory changes are
appreciated. Appendix is normal.

Vascular/Lymphatic: No significant vascular findings are present. No
enlarged abdominal or pelvic lymph nodes.

Reproductive: Uterus and bilateral adnexa are unremarkable.

Other: No free air or free fluid in the abdomen. Abdominal wall
musculature appears intact.

Musculoskeletal: Vertebral hemangioma and L3.
IMPRESSION: 1. Cholelithiasis with mild stranding around the gallbladder
possibly indicating cholecystitis.
2. No evidence of bowel obstruction or inflammation.

## 2022-11-12 DIAGNOSIS — Z419 Encounter for procedure for purposes other than remedying health state, unspecified: Secondary | ICD-10-CM | POA: Diagnosis not present

## 2022-11-17 ENCOUNTER — Encounter (INDEPENDENT_AMBULATORY_CARE_PROVIDER_SITE_OTHER): Payer: Self-pay | Admitting: Family Medicine

## 2022-11-17 ENCOUNTER — Other Ambulatory Visit (HOSPITAL_COMMUNITY): Payer: Self-pay

## 2022-11-17 ENCOUNTER — Ambulatory Visit (INDEPENDENT_AMBULATORY_CARE_PROVIDER_SITE_OTHER): Payer: Medicaid Other | Admitting: Family Medicine

## 2022-11-17 ENCOUNTER — Other Ambulatory Visit: Payer: Self-pay

## 2022-11-17 VITALS — BP 96/60 | HR 82 | Temp 98.4°F | Ht 66.0 in | Wt 167.0 lb

## 2022-11-17 DIAGNOSIS — F3289 Other specified depressive episodes: Secondary | ICD-10-CM

## 2022-11-17 DIAGNOSIS — E669 Obesity, unspecified: Secondary | ICD-10-CM

## 2022-11-17 DIAGNOSIS — F5089 Other specified eating disorder: Secondary | ICD-10-CM | POA: Diagnosis not present

## 2022-11-17 DIAGNOSIS — Z6827 Body mass index (BMI) 27.0-27.9, adult: Secondary | ICD-10-CM | POA: Diagnosis not present

## 2022-11-17 DIAGNOSIS — E559 Vitamin D deficiency, unspecified: Secondary | ICD-10-CM

## 2022-11-17 MED ORDER — VITAMIN D (ERGOCALCIFEROL) 1.25 MG (50000 UNIT) PO CAPS
50000.0000 [IU] | ORAL_CAPSULE | ORAL | 0 refills | Status: DC
Start: 2022-11-17 — End: 2022-12-21
  Filled 2022-11-17: qty 5, 35d supply, fill #0

## 2022-11-17 MED ORDER — BUPROPION HCL ER (SR) 200 MG PO TB12
200.0000 mg | ORAL_TABLET | Freq: Every day | ORAL | 0 refills | Status: DC
  Filled 2022-11-17: qty 30, 30d supply, fill #0

## 2022-11-17 NOTE — Progress Notes (Signed)
.sm[facility]  Office: [phone]  /  Fax: [phone]  WEIGHT SUMMARY AND BIOMETRICS  Anthropometric Measurements Height: 5\' 6"  (1.676 m) Weight: 167 lb (75.8 kg) BMI (Calculated): 26.97 Weight at Last Visit: 154 lb Weight Lost Since Last Visit: 0 Weight Gained Since Last Visit: 13 lb Starting Weight: 188 lb Total Weight Loss (lbs): 21 lb (9.526 kg)   Body Composition  Body Fat %: 32.9 % Fat Mass (lbs): 55.2 lbs Muscle Mass (lbs): 106.8 lbs Total Body Water (lbs): 73.6 lbs Visceral Fat Rating : 5   Other Clinical Data Fasting: No Labs: No Today's Visit #: 23 Starting Date: 11/13/20    Chief Complaint: OBESITY   History of Present Illness   The patient, with a history of vitamin D deficiency, obesity, and depression with emotional eating behaviors, presents for a follow-up visit. She has been on prescription vitamin D 50,000 international units weekly and Wellbutrin XL 150 mg daily. Since her last visit in May, she has gained 13 pounds despite being prescribed a journaling plan of 1200 calories and 80 or more grams of protein daily. She reports engaging in yoga, strengthening exercises, and hiking for 60 minutes three times a week.  The patient recently started a new job, which she describes as both good and hectic. She expresses difficulty with meal planning and prepping due to her busy schedule. She has been prioritizing protein and preparing healthier meals for her household. However, she expresses concern about slipping back into weight gain without medicinal intervention. She reports a persistent craving for sugar, even after consuming protein-rich meals. She has been managing these cravings with small amounts of dark chocolate, but she does not feel satisfied. She also reports avoiding sugary drinks and consuming a lot of water.  The patient was recently prescribed naltrexone in conjunction with Wellbutrin by her primary care doctor, following an ADHD diagnosis. However, she  discontinued the naltrexone after experiencing unpleasant side effects, including nausea, irritability, and dizziness. She expresses a desire for medicinal intervention to help manage her weight and cravings. She is also taking vitamin B12 and vitamin D supplements.  The patient has no history of kidney stones or seizure disorders. She is not planning for children and her partner has had a vasectomy. She is currently exploring insurance options following a recent job change. She expresses concern about upcoming holiday celebrations and a planned trip to [facility], anticipating these events may pose challenges to her dietary control.          PHYSICAL EXAM:  Blood pressure 96/60, pulse 82, temperature 98.4 F (36.9 C), height 5\' 6"  (1.676 m), weight 167 lb (75.8 kg), SpO2 98%. Body mass index is 26.95 kg/m.  DIAGNOSTIC DATA REVIEWED:  BMET    Component Value Date/Time   NA 138 10/15/2022 1321   NA 139 04/01/2022 0835   K 4.7 10/15/2022 1321   CL 103 10/15/2022 1321   CO2 29 10/15/2022 1321   GLUCOSE 77 10/15/2022 1321   BUN 14 10/15/2022 1321   BUN 17 04/01/2022 0835   CREATININE 0.88 10/15/2022 1321   CALCIUM 9.7 10/15/2022 1321   GFRNONAA >60 07/12/2020 0120   GFRAA 93 09/15/2017 1058   Lab Results  Component Value Date   HGBA1C 5.2 04/01/2022   HGBA1C 5.1 11/13/2020   Lab Results  Component Value Date   INSULIN 10.9 04/01/2022   INSULIN 13.2 11/13/2020   Lab Results  Component Value Date   TSH 2.150 04/01/2022   CBC    Component  Value Date/Time   WBC 5.4 10/15/2022 1321   RBC 4.79 10/15/2022 1321   HGB 13.9 10/15/2022 1321   HGB 13.9 12/09/2021 0848   HCT 42.3 10/15/2022 1321   HCT 40.6 12/09/2021 0848   PLT 296.0 10/15/2022 1321   PLT 266 12/09/2021 0848   MCV 88.3 10/15/2022 1321   MCV 86 12/09/2021 0848   MCH 29.6 12/09/2021 0848   MCH 29.5 07/12/2020 0120   MCHC 32.9 10/15/2022 1321   RDW 13.1 10/15/2022 1321   RDW 12.3 12/09/2021 0848   Iron  Studies    Component Value Date/Time   IRON 109 10/15/2022 1321   TIBC 366.8 10/15/2022 1321   FERRITIN 48.3 10/15/2022 1321   IRONPCTSAT 29.7 10/15/2022 1321   IRONPCTSAT 11 (L) 12/19/2020 1056   Lipid Panel     Component Value Date/Time   CHOL 162 04/01/2022 0835   TRIG 68 04/01/2022 0835   HDL 45 04/01/2022 0835   LDLCALC 104 (H) 04/01/2022 0835   Hepatic Function Panel     Component Value Date/Time   PROT 7.4 10/15/2022 1321   PROT 6.9 04/01/2022 0835   ALBUMIN 4.4 10/15/2022 1321   ALBUMIN 4.3 04/01/2022 0835   AST 12 10/15/2022 1321   ALT 12 10/15/2022 1321   ALKPHOS 38 (L) 10/15/2022 1321   BILITOT 0.5 10/15/2022 1321   BILITOT 0.4 04/01/2022 0835      Component Value Date/Time   TSH 2.150 04/01/2022 0835   Nutritional Lab Results  Component Value Date   VD25OH 38.4 04/01/2022   VD25OH 46.7 12/09/2021   VD25OH 45.5 06/03/2021     Assessment and Plan    Obesity and Emotional Eating Weight gain of 13 pounds since last visit. Difficulty with meal planning due to busy schedule. Cravings for sugar despite prioritizing protein. Currently on Wellbutrin XL 150mg . --Continue to prioritize protein and healthy meals. -Encourage use of strategies to ensure daily medication intake. -Plan to discuss holiday and vacation eating strategies at next visit.  Vitamin D Deficiency Currently on prescription Vitamin D 50,000 IU weekly. -Continue current regimen. -Refill prescription as patient reports running low.  Depression with emotional eating behaviors Currently managed with Wellbutrin XL. Recent life changes including new job and loss of a parent. -Continue current regimen of Wellbutrin XL, adjusting to 12-hour SR version at a dose of 200mg  daily. -Encourage continued physical activity such as yoga and hiking.  Follow-up in 4 weeks to assess response to medication change and discuss holiday eating strategies.        She was informed of the importance of frequent  follow up visits to maximize her success with intensive lifestyle modifications for her multiple health conditions.    Quillian Quince, MD

## 2022-11-23 ENCOUNTER — Other Ambulatory Visit (HOSPITAL_COMMUNITY): Payer: Self-pay

## 2022-11-23 ENCOUNTER — Other Ambulatory Visit: Payer: Self-pay

## 2022-11-23 ENCOUNTER — Ambulatory Visit: Payer: Medicaid Other | Admitting: Family Medicine

## 2022-11-23 VITALS — BP 110/82 | HR 90 | Temp 98.0°F | Wt 174.8 lb

## 2022-11-23 DIAGNOSIS — R41840 Attention and concentration deficit: Secondary | ICD-10-CM | POA: Diagnosis not present

## 2022-11-23 DIAGNOSIS — F3289 Other specified depressive episodes: Secondary | ICD-10-CM

## 2022-11-23 MED ORDER — FLUOCINONIDE 0.05 % EX OINT
1.0000 | TOPICAL_OINTMENT | Freq: Two times a day (BID) | CUTANEOUS | 5 refills | Status: AC
  Filled 2022-11-23: qty 30, 15d supply, fill #0
  Filled 2022-12-19: qty 30, 15d supply, fill #1
  Filled 2023-04-15: qty 30, 15d supply, fill #2

## 2022-11-23 MED ORDER — BUPROPION HCL ER (SR) 200 MG PO TB12
200.0000 mg | ORAL_TABLET | Freq: Every day | ORAL | 1 refills | Status: DC
  Filled 2022-11-23 – 2022-12-19 (×2): qty 90, 90d supply, fill #0

## 2022-11-23 NOTE — Progress Notes (Signed)
Patient ID: SKYA REDIC, female  DOB: [birth year], 42 y.o.   MRN: [id number] Patient Care Team    Relationship Specialty Notifications Start End  Natalia Leatherwood, DO PCP - General Family Medicine  11/14/20   Roswell Nickel, DO Consulting Physician Bariatrics  11/14/20   Bjorn Pippin, MD Consulting Physician Orthopedic Surgery  11/14/20   [facility] Nurse Practitioner Gynecology  11/14/20    Comment: Eveline Keto, NP    Chief Complaint  Patient presents with   ADHD    Subjective: FALON KENDAL is a 42 y.o.  Female  present for attention and concentration deficit All past medical history, surgical history, allergies, family history, immunizations, medications and social history were updated in the electronic medical record today. All recent labs, ED visits and hospitalizations within the last year were reviewed.  Patient was seen little over 1 month ago for her physical in which she expressed difficulty focusing.  Trial of Wellbutrin 150 mg XL was started and patient states she did see some improvement, but wonders if she could get better improvement with a stimulant addition. She was seen by weight loss counselor and Wellbutrin was changed to 200 mg SR daily, to also help with appetite control.      12/19/2020   10:29 AM 11/13/2020    8:29 AM 09/15/2017    9:55 AM  Depression screen PHQ 2/9  Decreased Interest 0 2 0  Down, Depressed, Hopeless 0 1 0  PHQ - 2 Score 0 3 0  Altered sleeping  2 0  Tired, decreased energy  3 3  Change in appetite  1 0  Feeling bad or failure about yourself   2 0  Trouble concentrating  1 0  Moving slowly or fidgety/restless  0 0  Suicidal thoughts  0 0  PHQ-9 Score  12 3  Difficult doing work/chores  Somewhat difficult        No data to display           Immunization History  Administered Date(s) Administered   Influenza, Seasonal, Injecte, Preservative Fre 10/15/2022   Influenza-Unspecified 12/10/2017, 09/14/2018,  10/11/2020   PFIZER(Purple Top)SARS-COV-2 Vaccination 03/23/2019, 04/16/2019, 12/15/2019   Pfizer Covid-19 Vaccine Bivalent Booster 73yrs & up 11/02/2020   Tdap 01/11/2014   Past Medical History:  Diagnosis Date   Allergies    Allergy    Back pain    Chicken pox    Depression 02/04/2022   Fatty liver    Gallbladder problem    Hx of vaginitis 12/10/2017   Recurrent yeast with Mirena IUD Reported history of uterine infection, but unable to find that in her Care Everywhere chart.   Insulin resistance 12/09/2021   Lactose intolerance    Menometrorrhagia    Before BCPs   Migraines    Psoriasis    No Known Allergies Past Surgical History:  Procedure Laterality Date   CHOLECYSTECTOMY N/A 07/12/2020   Procedure: LAPAROSCOPIC CHOLECYSTECTOMY;  Surgeon: Andria Meuse, MD;  Location: [facility];  Service: General;  Laterality: N/A;   KNEE ARTHROSCOPY WITH LATERAL RELEASE Right 12/27/2019   Procedure: KNEE ARTHROSCOPY WITH LATERAL RELEASE; REMOVAL LOOSE FOREIGN BODY; DEBRIDEMENT/SHAVING CHONDROPLASTY;  Surgeon: Bjorn Pippin, MD;  Location: [facility];  Service: Orthopedics;  Laterality: Right;   LASIK Bilateral 2016   TIBIAL TUBERCLERPLASTY Right 12/27/2019   Procedure: LIGAMENT RECONSTRUCTION KNEE EXTRA-ARTICULAR, ANTERIOR TIBIAL TUBERCLEPLASTY, DECOMPRESSION FASCIOTOMY, LEG ANTERIOR;  Surgeon: Bjorn Pippin, MD;  Location:  SURGERY CENTER;  Service: Orthopedics;  Laterality: Right;   WISDOM TOOTH EXTRACTION     Family History  Problem Relation Age of Onset   Hypertension Mother    Hyperlipidemia Mother    Anxiety disorder Mother    Sleep apnea Mother    Thyroid disease Father    Depression Father    Gout Father    Kidney Stones Father    Miscarriages / Stillbirths Sister    Hypertension Maternal Grandmother    Depression Maternal Grandmother    Skin cancer Maternal Grandmother    Diverticulitis Maternal Grandmother    Anxiety disorder Daughter     Depression Daughter    ADD / ADHD Daughter    Social History   Social History Narrative   Marital status/children/pets: Single/Div., 2 daughters   Education/employment: associates degree, administrative work   Field seismologist:      -smoke alarm in the home:Yes     - wears seatbelt: Yes     - Feels safe in their relationships: Yes       Allergies as of 11/23/2022   No Known Allergies      Medication List        Accurate as of November 23, 2022  2:48 PM. If you have any questions, ask your nurse or doctor.          STOP taking these medications    magnesium 30 MG tablet Stopped by: Felix Pacini   Turmeric 400 MG Caps Stopped by: Felix Pacini       TAKE these medications    buPROPion 200 MG 12 hr tablet Commonly known as: Wellbutrin SR Take 1 tablet (200 mg total) by mouth daily.   fluocinonide ointment 0.05 % Commonly known as: LIDEX Apply 1 Application topically 2 (two) times daily. What changed:  when to take this reasons to take this   gabapentin 100 MG capsule Commonly known as: NEURONTIN Take 2 capsules (200 mg total) by mouth at bedtime.   multivitamin with minerals Tabs tablet Take 1 tablet by mouth daily.   VITAMIN B-12 PO Take by mouth daily.   Vitamin D (Ergocalciferol) 1.25 MG (50000 UNIT) Caps capsule Commonly known as: DRISDOL Take 1 capsule (50,000 Units total) by mouth every 7 (seven) days.        All past medical history, surgical history, allergies, family history, immunizations andmedications were updated in the EMR today and reviewed under the history and medication portions of their EMR.      ROS 14 pt review of systems performed and negative (unless mentioned in an HPI)  Objective BP 110/82   Pulse 90   Temp 98 F (36.7 C)   Wt 174 lb 12.8 oz (79.3 kg)   SpO2 98%   BMI 28.21 kg/m  Physical Exam Vitals and nursing note reviewed.  Constitutional:      General: She is not in acute distress.    Appearance: Normal  appearance. She is normal weight. She is not ill-appearing or toxic-appearing.  HENT:     Head: Normocephalic and atraumatic.  Eyes:     General: No scleral icterus.       Right eye: No discharge.        Left eye: No discharge.     Extraocular Movements: Extraocular movements intact.     Conjunctiva/sclera: Conjunctivae normal.     Pupils: Pupils are equal, round, and reactive to light.  Skin:    Findings: No rash.  Neurological:  Mental Status: She is alert and oriented to person, place, and time. Mental status is at baseline.     Motor: No weakness.     Coordination: Coordination normal.     Gait: Gait normal.  Psychiatric:        Mood and Affect: Mood normal.        Behavior: Behavior normal.        Thought Content: Thought content normal.        Judgment: Judgment normal.     No results found.  Assessment/plan: BLANCHE KIMMINS is a 42 y.o. female present for  ADHD: We discussed today referral to attention specialist for formal reeval to see if she would meet criteria for trial of a stimulant.  Need to be cautious as she does have a lot of stress and anxiety, and stimulants can increase her anxiety. Continue Wellbutrin 200 (SR) every day.  Referral to [place] attention specialist placed for formal ADHD evaluation.  Return in about 24 weeks (around 05/10/2023) for Routine chronic condition follow-up.   Orders Placed This Encounter  Procedures   Ambulatory referral to Psychology    Meds ordered this encounter  Medications   buPROPion (WELLBUTRIN SR) 200 MG 12 hr tablet    Sig: Take 1 tablet (200 mg total) by mouth daily.    Dispense:  90 tablet    Refill:  1   fluocinonide ointment (LIDEX) 0.05 %    Sig: Apply 1 Application topically 2 (two) times daily.    Dispense:  30 g    Refill:  5    Referral Orders         Ambulatory referral to Psychology       Electronically signed by: Felix Pacini, DO  [facility]

## 2022-11-23 NOTE — Patient Instructions (Signed)

## 2022-12-08 NOTE — Progress Notes (Signed)
[facility] [address] [place] [address] Phone: [phone] Subjective:   Sherri [facility], am serving as a scribe for Dr. Antoine [facility].  I'm seeing this patient by the request  of:  Kuneff, Sherri [facility]  CC: Back and neck pain follow-up  [id number]  Sherri Cobb is a 42 y.o. female coming in with complaint of back and neck pain. OMT on 10/28/2022. Patient states same per usual. No new concerns.  Medications patient has been prescribed:   Taking:         Reviewed prior external information including notes and imaging from previsou exam, outside providers and external EMR if available.   As well as notes that were available from care everywhere and other healthcare systems.  Past medical history, social, surgical and family history all reviewed in electronic medical record.  No pertanent information unless stated regarding to the chief complaint.   Past Medical History:  Diagnosis Date   Allergies    Allergy    Back pain    Chicken pox    Depression 02/04/2022   Fatty liver    Gallbladder problem    Hx of vaginitis 12/10/2017   Recurrent yeast with Mirena IUD Reported history of uterine infection, but unable to find that in her Care Everywhere chart.   Insulin resistance 12/09/2021   Lactose intolerance    Menometrorrhagia    Before BCPs   Migraines    Psoriasis     No Known Allergies   Review of Systems:  No , visual changes, nausea, vomiting, diarrhea, constipation, dizziness, abdominal pain, skin rash, fevers, chills, night sweats, weight loss, swollen lymph nodes, body aches, joint swelling, chest pain, shortness of breath, mood changes. POSITIVE muscle aches, headache  Objective  Blood pressure 110/70, pulse 80, height 5\' 6"  (1.676 m), weight 175 lb (79.4 kg), SpO2 98%.   General: No apparent distress alert and oriented x3 mood and affect normal, dressed appropriately.  HEENT: Pupils equal, extraocular  movements intact  Respiratory: Patient's speak in full sentences and does not appear short of breath  Cardiovascular: No lower extremity edema, non tender, no erythema  MSK:  Back does have some loss lordosis noted.  Some tenderness noted in the [facility] aspect as well.  Patient does have some tightness in the lower back but nothing severe at the moment.  Neck exam does have some mild loss of lordosis.  Osteopathic findings  C2 flexed rotated and side bent right C6 flexed rotated and side bent left T3 extended rotated and side bent right inhaled rib T9 extended rotated and side bent left L2 flexed rotated and side bent right Sacrum right on right       Assessment and Plan:  Hypermobility arthralgia Hypermobility noted.  Discussed icing regimen and home exercises.  Does respond well to osteopathic manipulation.  Seems to be mostly of the lower back but did have some discomfort in the neck that could be causing some cervicogenic headaches.  Continue to monitor.  Follow-up again in 6 to 8 weeks for further evaluation of the hypermobility as well as the headaches.    Nonallopathic problems  Decision today to treat with OMT was based on Physical Exam  After verbal consent patient was treated with HVLA, ME, FPR techniques in cervical, rib, thoracic, lumbar, and sacral  areas  Patient tolerated the procedure well with improvement in symptoms  Patient given exercises, stretches and lifestyle modifications  See medications in patient instructions if  given  Patient will follow up in 6-8 weeks    The above documentation has been reviewed and is accurate and complete Sherri Saa, Cobb          Note: This dictation was prepared with Dragon dictation along with smaller phrase technology. Any transcriptional errors that result from this process are unintentional.

## 2022-12-12 DIAGNOSIS — Z419 Encounter for procedure for purposes other than remedying health state, unspecified: Secondary | ICD-10-CM | POA: Diagnosis not present

## 2022-12-15 ENCOUNTER — Ambulatory Visit: Payer: Medicaid Other | Admitting: Family Medicine

## 2022-12-15 ENCOUNTER — Encounter: Payer: Self-pay | Admitting: Family Medicine

## 2022-12-15 VITALS — BP 110/70 | HR 80 | Ht 66.0 in | Wt 175.0 lb

## 2022-12-15 DIAGNOSIS — M9904 Segmental and somatic dysfunction of sacral region: Secondary | ICD-10-CM

## 2022-12-15 DIAGNOSIS — M9901 Segmental and somatic dysfunction of cervical region: Secondary | ICD-10-CM | POA: Diagnosis not present

## 2022-12-15 DIAGNOSIS — M9902 Segmental and somatic dysfunction of thoracic region: Secondary | ICD-10-CM

## 2022-12-15 DIAGNOSIS — M9903 Segmental and somatic dysfunction of lumbar region: Secondary | ICD-10-CM | POA: Diagnosis not present

## 2022-12-15 DIAGNOSIS — M2550 Pain in unspecified joint: Secondary | ICD-10-CM

## 2022-12-15 DIAGNOSIS — M9908 Segmental and somatic dysfunction of rib cage: Secondary | ICD-10-CM | POA: Diagnosis not present

## 2022-12-15 NOTE — Patient Instructions (Signed)
Good to see you! Happy Holidays See you again in 2-3 months

## 2022-12-15 NOTE — Assessment & Plan Note (Signed)
Hypermobility noted.  Discussed icing regimen and home exercises.  Does respond well to osteopathic manipulation.  Seems to be mostly of the lower back but did have some discomfort in the neck that could be causing some cervicogenic headaches.  Continue to monitor.  Follow-up again in 6 to 8 weeks for further evaluation of the hypermobility as well as the headaches.

## 2022-12-20 ENCOUNTER — Other Ambulatory Visit: Payer: Self-pay

## 2022-12-20 ENCOUNTER — Other Ambulatory Visit (HOSPITAL_COMMUNITY): Payer: Self-pay

## 2022-12-21 ENCOUNTER — Encounter (INDEPENDENT_AMBULATORY_CARE_PROVIDER_SITE_OTHER): Payer: Self-pay | Admitting: Physician Assistant

## 2022-12-21 ENCOUNTER — Other Ambulatory Visit: Payer: Self-pay

## 2022-12-21 ENCOUNTER — Encounter: Payer: Self-pay | Admitting: Pharmacist

## 2022-12-21 ENCOUNTER — Ambulatory Visit (INDEPENDENT_AMBULATORY_CARE_PROVIDER_SITE_OTHER): Payer: Medicaid Other | Admitting: Physician Assistant

## 2022-12-21 ENCOUNTER — Other Ambulatory Visit (HOSPITAL_COMMUNITY): Payer: Self-pay

## 2022-12-21 VITALS — BP 102/69 | HR 74 | Temp 97.7°F | Ht 66.0 in | Wt 173.0 lb

## 2022-12-21 DIAGNOSIS — E669 Obesity, unspecified: Secondary | ICD-10-CM

## 2022-12-21 DIAGNOSIS — E559 Vitamin D deficiency, unspecified: Secondary | ICD-10-CM | POA: Diagnosis not present

## 2022-12-21 DIAGNOSIS — Z6828 Body mass index (BMI) 28.0-28.9, adult: Secondary | ICD-10-CM

## 2022-12-21 DIAGNOSIS — F5089 Other specified eating disorder: Secondary | ICD-10-CM | POA: Diagnosis not present

## 2022-12-21 DIAGNOSIS — Z6827 Body mass index (BMI) 27.0-27.9, adult: Secondary | ICD-10-CM | POA: Diagnosis not present

## 2022-12-21 DIAGNOSIS — F3289 Other specified depressive episodes: Secondary | ICD-10-CM

## 2022-12-21 MED ORDER — VITAMIN D (ERGOCALCIFEROL) 1.25 MG (50000 UNIT) PO CAPS
50000.0000 [IU] | ORAL_CAPSULE | ORAL | 0 refills | Status: DC
  Filled 2022-12-21: qty 5, 35d supply, fill #0

## 2022-12-21 MED ORDER — TOPIRAMATE 25 MG PO TABS
25.0000 mg | ORAL_TABLET | Freq: Every evening | ORAL | 0 refills | Status: DC
  Filled 2022-12-21: qty 45, 30d supply, fill #0
  Filled 2022-12-27: qty 45, 45d supply, fill #0

## 2022-12-21 NOTE — Progress Notes (Unsigned)
SUBJECTIVE:  Chief Complaint: Obesity  Interim History: She is up 6 lbs since last visit.  Sherri Cobb, a 43 year old individual with a history of obesity, vitamin D insufficiency, emotional eating, and recently diagnosed attention and concentration deficit, presents for a follow-up visit regarding her obesity treatment plan. The patient is currently on ergocalciferol 50,000 units once a week and Wellbutrin SR 200mg  daily.  The patient reports experiencing stress, primarily due to her children, and acknowledges that this stress may be contributing to her weight issues. She has been taking Wellbutrin, which she reports helps with her cravings for sweets when she remembers to take it. However, she notes that it does not have the same effect as Wegovy, a medication she was previously on but had to discontinue due to insurance issues related to her BMI being under 30.  The patient also reports a recent diagnosis of attention and concentration deficit. She has been trying to manage her weight through dietary changes, focusing on protein intake and healthier food choices. However, she struggles with evening snacking and has difficulty transitioning from work to self-care activities, such as exercise or yoga, due to her ADHD.  The patient has tried naltrexone in the past but experienced significant nausea. She expresses a desire for additional help in managing her weight and cravings, particularly in the evenings. She also expresses a desire for increased energy levels, noting that she previously had a positive experience with phentermine. However, she was informed that phentermine is not currently an option for her, possibly due to her BMI or recent ADD diagnosis.  The patient acknowledges the need for self-care and expresses a willingness to try new strategies, including medication and lifestyle changes, to improve her health and manage her weight. She expresses a desire to be more active and to find  time for activities such as yoga or gym workouts. However, she notes that her current workload and family responsibilities make it challenging to find the time for these activities.  Sherri Cobb is here to discuss her progress with her obesity treatment plan. She is on the keeping a food journal and adhering to recommended goals of 1200 calories and 80+ grams of protein and states she is following her eating plan approximately 40 % of the time. She states she is exercising yoga/walking/pilates 30-45 minutes 1 times per week.  Pharmacotherapy: Previously on Wegovy with good weight loss, but no insurance coverage.     Wellbutrin started for ADD, but also may be helpful with cravings/emotional eating.         Patient denies history of glaucoma or kidney stones. She was informed of side effects and that topiramate is teratogenic. Her long time partner is S/P vasectomy remotely. Agreeable to start topamax for cravings.   OBJECTIVE: Visit Diagnoses: Problem List Items Addressed This Visit     Generalized obesity   Vitamin D insufficiency - Primary   B12 deficiency   Depression   Relevant Medications   topiramate (TOPAMAX) 25 MG tablet   BMI 28.0-28.9,adult  Obesity 42 year old with obesity, BMI 28-29.4. Previously on Wegovy, but insurance no longer covers it due to BMI under 30. Currently on Wellbutrin SR 200 mg daily, which helps with emotional eating but is less effective than Wegovy. Discussed alternative medications including topiramate and phentermine. Topiramate can help with cravings and sleep, and is started at a low dose to minimize side effects. Phentermine was previously effective but is not an option currently due to BMI and potential interaction with ADHD diagnosis. Encouraged  consistent protein intake and stress management techniques. - Start topiramate 25 mg at bedtime, increase to 50 mg at bedtime after one week if well tolerated - Continue Wellbutrin SR 200 mg daily - Encourage  consistent protein intake (80-85 grams/day) - Encourage physical activity and stress management techniques such as yoga  Emotional Eating Managed with Wellbutrin SR 200 mg daily. Reports difficulty remembering to take medication consistently, which affects its efficacy. Discussed potential benefits of topiramate to help with cravings and sleep. Patient denies history of glaucoma or kidney stones. She was informed of side effects and that topiramate is teratogenic. Her partner is S/P vasectomy many years ago for birth control.   - Continue Wellbutrin SR 200 mg daily - Encourage consistent medication adherence - Start topiramate 25 mg nightly and increase to 50 mg nightly after 1 week if tolerating well to help with cravings and sleep  Attention and Concentration Deficit Recently diagnosed. Started on Wellbutrin for dual effects on cravings as well as ADD.  She notes she does not always remember to take it in the mornings and does not want to take it later during the day if she forgets it due to possible effects on sleep.  - Encourage consistent medication adherence  Vitamin D Deficiency Vitamin D is not at goal of 50.  Most recent vitamin D level was 38.4. She is on  prescription ergocalciferol 50,000 IU weekly. No N/V or muscle weakness with Ergocalciferol.   Lab Results  Component Value Date   VD25OH 38.4 04/01/2022   VD25OH 46.7 12/09/2021   VD25OH 45.5 06/03/2021    Plan: Continue and refill  prescription ergocalciferol 50,000 IU weekly Low vitamin D levels can be associated with adiposity and may result in leptin resistance and weight gain. Also associated with fatigue. Currently on vitamin D supplementation without any adverse effects.  Managed with ergocalciferol 50,000 units once a week.   General Health Maintenance Discussed importance of self-care, stress management, and maintaining a healthy diet. Recommended reading 'Good Energy' by [employer] for additional insights on  diet and stress management. - Encourage self-care and stress management techniques - Recommend reading 'Good Energy' by Baird Lyons Means  Follow-up - Schedule follow-up appointment in four weeks.  Vitals Temp: 97.7 F (36.5 C) BP: 102/69 Pulse Rate: 74 SpO2: 99 %   Anthropometric Measurements Height: 5\' 6"  (1.676 m) Weight: 173 lb (78.5 kg) BMI (Calculated): 27.94 Weight at Last Visit: 167 lb Weight Lost Since Last Visit: 0 lb Weight Gained Since Last Visit: 6 lb Starting Weight: 188 lb Total Weight Loss (lbs): 15 lb (6.804 kg)   Body Composition  Body Fat %: 33.8 % Fat Mass (lbs): 58.6 lbs Muscle Mass (lbs): 108.8 lbs Total Body Water (lbs): 74.6 lbs Visceral Fat Rating : 6   Other Clinical Data Fasting: Yes Labs: No Today's Visit #: 23 Starting Date: 11/12/20     ASSESSMENT AND PLAN:  Diet: Madalee is currently in the action stage of change. As such, her goal is to continue with weight loss efforts. She has agreed to keeping a food journal and adhering to recommended goals of 1200 calories and 80+ grams of  protein.  Exercise: Roqaya has been instructed to work up to a goal of 150 minutes of combined cardio and strengthening exercise per week for weight loss and overall health benefits.   Behavior Modification:  We discussed the following Behavioral Modification Strategies today: increasing lean protein intake, decreasing simple carbohydrates, increasing vegetables, increase H2O intake, increase high fiber  foods, better snacking choices, emotional eating strategies , and planning for success. We discussed various medication options to help Ameena with her weight loss efforts and we both agreed to begin topamax for emotional eating/cravings.  Return in about 4 weeks (around 01/18/2023).Marland Kitchen She was informed of the importance of frequent follow up visits to maximize her success with intensive lifestyle modifications for her multiple health conditions.  Attestation  Statements:   Reviewed by clinician on day of visit: allergies, medications, problem list, medical history, surgical history, family history, social history, and previous encounter notes.   Time spent on visit including pre-visit chart review and post-visit care and charting was 49 minutes.    Tatijana Bierly, PA-C

## 2022-12-24 ENCOUNTER — Other Ambulatory Visit: Payer: Self-pay

## 2022-12-27 ENCOUNTER — Other Ambulatory Visit (HOSPITAL_COMMUNITY): Payer: Self-pay

## 2022-12-27 ENCOUNTER — Other Ambulatory Visit: Payer: Self-pay

## 2023-01-12 DIAGNOSIS — Z419 Encounter for procedure for purposes other than remedying health state, unspecified: Secondary | ICD-10-CM | POA: Diagnosis not present

## 2023-01-17 ENCOUNTER — Ambulatory Visit: Payer: Medicaid Other

## 2023-01-17 NOTE — Progress Notes (Signed)
 SUBJECTIVE:  Chief Complaint: Obesity  Interim History: She is up 1 lb since her last visit.  Bio impedence scale reviewed with the patient:  Muscle mass + 2.4 lbs Adipose mass - 1.2 lbs Total body water -3 lbs Sherri Cobb is a 43 year old female with a history of polyphagia, vitamin D  deficiency, and emotional eating behavior, presents for a follow-up visit regarding her obesity treatment plan. She reports a lapse in her diet regimen during the holiday season but expresses a renewed commitment to her health in the new year. Despite the dietary lapse, the patient notes an increase in physical activity, specifically walking, during a recent trip to [facility].  The patient has been trialing topiramate , though not consistently, and reports no significant changes in appetite or taste. She experienced mild pressure behind the eyes initially but has since increased the dosage to two pills without further side effects. The patient also takes gabapentin  for nerve pain, which may be influencing her sleep patterns. She expresses interest in the potential benefits of topiramate  for her frequent migraines.  In addition to the prescribed medications, the patient takes over-the-counter vitamin D  and B supplements. She reports no issues with the vitamin D  but does notice an energy boost when consistently taking vitamin B. The patient's diet leans towards a Mediterranean style, prioritizing protein and limiting sugar intake due to its negative impact on her energy levels and overall well-being.  The patient's current goal is to establish a consistent routine that includes regular gym visits, aiming for a minimum of two to three days per week. She expresses a desire to prioritize her personal health and incorporate regular workouts into her daily schedule. The patient also mentions the stress of managing her children's schedules, which has been disrupted due to recent snow days.  Despite the challenges, the  patient remains committed to her health journey, expressing a desire to continue building muscle mass and reducing adipose tissue. She acknowledges the importance of exercise in her weight loss journey.. The patient is not currently journaling her food intake but is considering revisiting this strategy. She is making smart food choices and practicing portion control, aiming to adhere to her diet plan 80% of the time.  Sherri Cobb is here to discuss her progress with her obesity treatment plan. She is on the keeping a food journal and adhering to recommended goals of 1200 calories and 80+ grams of protein and states she is following her eating plan approximately 40 % of the time. She states she is exercising strength training/yoga/walking 30 minutes 3 times per week.  Pharmacotherapy: Previously on Wegovy  with good weight loss, but no insurance coverage.                                      Wellbutrin  started for ADD, but also may be helpful with cravings/emotional eating.                                      Naltrexone - side effect of nausea and stopped.  Started topiramate  12/21/22                                     Patient denies history of glaucoma or kidney stones. She was informed of side  effects and that topiramate  is teratogenic. Her long time partner is S/P vasectomy remotely. Agreeable to start topamax  for cravings.  OBJECTIVE: Visit Diagnoses: Problem List Items Addressed This Visit     Migraine headache   Relevant Medications   topiramate  (TOPAMAX ) 25 MG tablet   Generalized obesity   Vitamin D  insufficiency   Relevant Medications   Vitamin D , Ergocalciferol , (DRISDOL ) 1.25 MG (50000 UNIT) CAPS capsule   Depression   Relevant Medications   topiramate  (TOPAMAX ) 25 MG tablet   Other Visit Diagnoses       Polyphagia    -  Primary     Obesity with polyphagia 43 year old with obesity, polyphagia, vitamin D  deficiency, and emotional eating. Increased physical activity over the  holidays led to a slight increase in muscle mass and minor decrease in adipose tissue. Inconsistent topiramate  use with mild side effects (pressure behind the eyes). No significant changes in appetite or taste. Discussed topiramate 's benefits for weight management and migraine prophylaxis, emphasizing portion control, dietary choices, and regular physical activity. - Continue topiramate  50 mg daily for another month - Refill topiramate  with 25 mg tablets for flexible dosing - Encourage consistent use of topiramate  - Consider a weighted vest for walking to increase muscle mass - Encourage regular physical activity, aiming for at least 2-3 days per week at the gym - Discuss portion control and smart dietary choices - Consider a category two meal plan focusing on Mediterranean or pescetarian diet    Migraine Prophylaxis Frequent migraines. Topiramate  may help with migraine prophylaxis and weight management. - Continue topiramate  for migraine prophylaxis in addition to polyphagia/emotional eating .   Vitamin D  Deficiency On Ergocalciferol  50,000 units weekly, no issues reported. No significant difference in energy levels with vitamin D  supplementation but improvements noted with vitamin B. - Continue/refill Ergocalciferol  50,000 units weekly.  Low vitamin D  levels can be associated with adiposity and may result in leptin resistance and weight gain. Also associated with fatigue. Currently on vitamin D  supplementation without any adverse effects.  Recheck vitamin levels periodically to optimize supplementation/avoid over supplementation.  - Encourage consistent use of vitamin B12, preferably in sublingual form for better absorption   Eating disorder/emotional eating Sherri Cobb has had issues with stress/emotional eating. Currently this is moderately controlled. Overall mood is stable. Medication(s): Bupropion  SR 200 mg daily in am and Topiramate  50 mg nightly.  No side effects with medications.   Plan: Continue and refill Topiramate  50 mg nightly and continue Bupropion  200 mg daily   General Health Maintenance Overall health is good with a BMI of 28.1 and a healthy visceral adipose rating of 6.  Emphasized maintaining physical activity and a balanced diet for continued weight loss and muscle gain. - Encourage regular physical activity - Promote a balanced diet focusing on Mediterranean or pescetarian options - Discuss the 80/20 rule for dietary consistency - Encourage stress reduction and adequate sleep  Follow-up - Follow-up appointment on February 4th at 2:30 PM.  Vitals Temp: 98.6 F (37 C) BP: 106/73 Pulse Rate: 92 SpO2: 98 %   Anthropometric Measurements Height: 5' 6 (1.676 m) Weight: 174 lb (78.9 kg) BMI (Calculated): 28.1 Weight at Last Visit: 173 lb Weight Lost Since Last Visit: 0 Weight Gained Since Last Visit: 1 lb Starting Weight: 188 lb Total Weight Loss (lbs): 14 lb (6.35 kg)   Body Composition  Body Fat %: 32.9 % Fat Mass (lbs): 57.4 lbs Muscle Mass (lbs): 111.2 lbs Total Body Water (lbs): 71.6 lbs Visceral Fat Rating :  6   Other Clinical Data Fasting: no Labs: no Today's Visit #: 24 Starting Date: 11/12/20     ASSESSMENT AND PLAN:  Diet: Sherri Cobb is currently in the action stage of change. As such, her goal is to continue with weight loss efforts. She has agreed to Category 2 Plan, Pescatarian Plan, and practicing portion control and making smarter food choices, such as increasing vegetables and decreasing simple carbohydrates.  Exercise: Sherri Cobb has been instructed to work up to a goal of 150 minutes of combined cardio and strengthening exercise per week for weight loss and overall health benefits.   Behavior Modification:  We discussed the following Behavioral Modification Strategies today: increasing lean protein intake, decreasing simple carbohydrates, increasing vegetables, increase H2O intake, increase high fiber foods, better  snacking choices, emotional eating strategies , avoiding temptations, and planning for success. We discussed various medication options to help Sherri Cobb with her weight loss efforts and we both agreed to continue topiramate  for cravings/amotional eating and continue Wellbutrin  for same as well as ADD.   Return in about 4 weeks (around 02/15/2023).SABRA She was informed of the importance of frequent follow up visits to maximize her success with intensive lifestyle modifications for her multiple health conditions.  Attestation Statements:   Reviewed by clinician on day of visit: allergies, medications, problem list, medical history, surgical history, family history, social history, and previous encounter notes.   Time spent on visit including pre-visit chart review and post-visit care and charting was 42 minutes.    Sahmir Weatherbee, PA-C

## 2023-01-18 ENCOUNTER — Other Ambulatory Visit (HOSPITAL_COMMUNITY): Payer: Self-pay

## 2023-01-18 ENCOUNTER — Encounter (INDEPENDENT_AMBULATORY_CARE_PROVIDER_SITE_OTHER): Payer: Self-pay | Admitting: Physician Assistant

## 2023-01-18 ENCOUNTER — Ambulatory Visit (INDEPENDENT_AMBULATORY_CARE_PROVIDER_SITE_OTHER): Payer: Medicaid Other | Admitting: Physician Assistant

## 2023-01-18 ENCOUNTER — Other Ambulatory Visit: Payer: Self-pay

## 2023-01-18 VITALS — BP 106/73 | HR 92 | Temp 98.6°F | Ht 66.0 in | Wt 174.0 lb

## 2023-01-18 DIAGNOSIS — E559 Vitamin D deficiency, unspecified: Secondary | ICD-10-CM | POA: Diagnosis not present

## 2023-01-18 DIAGNOSIS — E669 Obesity, unspecified: Secondary | ICD-10-CM

## 2023-01-18 DIAGNOSIS — F3289 Other specified depressive episodes: Secondary | ICD-10-CM

## 2023-01-18 DIAGNOSIS — G43909 Migraine, unspecified, not intractable, without status migrainosus: Secondary | ICD-10-CM

## 2023-01-18 DIAGNOSIS — R632 Polyphagia: Secondary | ICD-10-CM

## 2023-01-18 DIAGNOSIS — Z6828 Body mass index (BMI) 28.0-28.9, adult: Secondary | ICD-10-CM | POA: Diagnosis not present

## 2023-01-18 MED ORDER — TOPIRAMATE 25 MG PO TABS
50.0000 mg | ORAL_TABLET | Freq: Every evening | ORAL | 0 refills | Status: DC
  Filled 2023-01-18: qty 60, 30d supply, fill #0

## 2023-01-18 MED ORDER — VITAMIN D (ERGOCALCIFEROL) 1.25 MG (50000 UNIT) PO CAPS
50000.0000 [IU] | ORAL_CAPSULE | ORAL | 0 refills | Status: DC
  Filled 2023-01-18: qty 5, 35d supply, fill #0

## 2023-01-19 ENCOUNTER — Other Ambulatory Visit: Payer: Self-pay

## 2023-02-01 NOTE — Progress Notes (Signed)
[name] [address] [place] [address] Phone: [phone] Subjective:   Sherri Cobb, am serving as a scribe for Dr. Antoine Primas.  I'm seeing this patient by the request  of:  Kuneff, Renee A, DO  CC: back and neck pain follow up   [id number]  Sherri Cobb is a 43 y.o. female coming in with complaint of back and neck pain. OMT on 12/15/2022. Patient states same per usual. No new concerns.  Has been doing relatively well overall.  Some mild discomfort from time to time.  Medications patient has been prescribed:   Taking:         Reviewed prior external information including notes and imaging from previsou exam, outside providers and external EMR if available.   As well as notes that were available from care everywhere and other healthcare systems.  Past medical history, social, surgical and family history all reviewed in electronic medical record.  No pertanent information unless stated regarding to the chief complaint.   Past Medical History:  Diagnosis Date   Allergies    Allergy    Back pain    Chicken pox    Depression 02/04/2022   Fatty liver    Gallbladder problem    Hx of vaginitis 12/10/2017   Recurrent yeast with Mirena IUD Reported history of uterine infection, but unable to find that in her Care Everywhere chart.   Insulin resistance 12/09/2021   Lactose intolerance    Menometrorrhagia    Before BCPs   Migraines    Psoriasis     No Known Allergies   Review of Systems:  No headache, visual changes, nausea, vomiting, diarrhea, constipation, dizziness, abdominal pain, skin rash, fevers, chills, night sweats, weight loss, swollen lymph nodes, body aches, joint swelling, chest pain, shortness of breath, mood changes. POSITIVE muscle aches  Objective  Pulse 97, height 5\' 6"  (1.676 m), weight 180 lb (81.6 kg), SpO2 98%.   General: No apparent distress alert and oriented x3 mood and affect normal, dressed  appropriately.  Patient is accompanied with her children today HEENT: Pupils equal, extraocular movements intact  Respiratory: Patient's speak in full sentences and does not appear short of breath  Cardiovascular: No lower extremity edema, non tender, no erythema  MSK:  Back does have some tightness noted.  Positive Faber neck does have some tightness as well seems to be right greater than left.  Osteopathic findings  C2 flexed rotated and side bent right C5 flexed rotated and side bent right T3 extended rotated and side bent right inhaled rib L3 flexed rotated and side bent right Sacrum right on right       Assessment and Plan:  Hypermobility arthralgia Hypermobility still noted.  Discussed icing regimen and home exercises, discussed which activities to do and which ones to avoid.  Increase activity slowly.  No other large changes today.  Follow-up again in 2 to 3 months    Nonallopathic problems  Decision today to treat with OMT was based on Physical Exam  After verbal consent patient was treated with HVLA, ME, FPR techniques in cervical, rib, thoracic, lumbar, and sacral  areas  Patient tolerated the procedure well with improvement in symptoms  Patient given exercises, stretches and lifestyle modifications  See medications in patient instructions if given  Patient will follow up in 4-8 weeks    The above documentation has been reviewed and is accurate and complete [name], DO  Note: This dictation was prepared with Dragon dictation along with smaller phrase technology. Any transcriptional errors that result from this process are unintentional.

## 2023-02-03 ENCOUNTER — Encounter: Payer: Self-pay | Admitting: Family Medicine

## 2023-02-03 ENCOUNTER — Ambulatory Visit: Payer: Medicaid Other | Admitting: Family Medicine

## 2023-02-03 VITALS — HR 97 | Ht 66.0 in | Wt 180.0 lb

## 2023-02-03 DIAGNOSIS — M2550 Pain in unspecified joint: Secondary | ICD-10-CM | POA: Diagnosis not present

## 2023-02-03 DIAGNOSIS — M9903 Segmental and somatic dysfunction of lumbar region: Secondary | ICD-10-CM

## 2023-02-03 DIAGNOSIS — M9901 Segmental and somatic dysfunction of cervical region: Secondary | ICD-10-CM

## 2023-02-03 DIAGNOSIS — M9902 Segmental and somatic dysfunction of thoracic region: Secondary | ICD-10-CM

## 2023-02-03 DIAGNOSIS — M9904 Segmental and somatic dysfunction of sacral region: Secondary | ICD-10-CM

## 2023-02-03 DIAGNOSIS — M9908 Segmental and somatic dysfunction of rib cage: Secondary | ICD-10-CM | POA: Diagnosis not present

## 2023-02-03 NOTE — Assessment & Plan Note (Signed)
Hypermobility still noted.  Discussed icing regimen and home exercises, discussed which activities to do and which ones to avoid.  Increase activity slowly.  No other large changes today.  Follow-up again in 2 to 3 months

## 2023-02-03 NOTE — Patient Instructions (Signed)
Great to see you  Tell mom I say hi Overall doing well See you again in 6-8 weeks

## 2023-02-05 ENCOUNTER — Encounter: Payer: Self-pay | Admitting: Family Medicine

## 2023-02-10 ENCOUNTER — Ambulatory Visit: Payer: Medicaid Other

## 2023-02-12 DIAGNOSIS — Z419 Encounter for procedure for purposes other than remedying health state, unspecified: Secondary | ICD-10-CM | POA: Diagnosis not present

## 2023-02-15 ENCOUNTER — Ambulatory Visit (INDEPENDENT_AMBULATORY_CARE_PROVIDER_SITE_OTHER): Payer: Medicaid Other | Admitting: Physician Assistant

## 2023-02-15 ENCOUNTER — Other Ambulatory Visit (HOSPITAL_COMMUNITY): Payer: Self-pay

## 2023-02-15 ENCOUNTER — Other Ambulatory Visit: Payer: Self-pay

## 2023-02-15 ENCOUNTER — Encounter (INDEPENDENT_AMBULATORY_CARE_PROVIDER_SITE_OTHER): Payer: Self-pay | Admitting: Physician Assistant

## 2023-02-15 VITALS — BP 104/72 | HR 89 | Temp 98.4°F | Ht 66.0 in | Wt 175.0 lb

## 2023-02-15 DIAGNOSIS — F32A Depression, unspecified: Secondary | ICD-10-CM

## 2023-02-15 DIAGNOSIS — Z6828 Body mass index (BMI) 28.0-28.9, adult: Secondary | ICD-10-CM | POA: Diagnosis not present

## 2023-02-15 DIAGNOSIS — F3289 Other specified depressive episodes: Secondary | ICD-10-CM

## 2023-02-15 DIAGNOSIS — E669 Obesity, unspecified: Secondary | ICD-10-CM | POA: Diagnosis not present

## 2023-02-15 DIAGNOSIS — E559 Vitamin D deficiency, unspecified: Secondary | ICD-10-CM | POA: Diagnosis not present

## 2023-02-15 DIAGNOSIS — R632 Polyphagia: Secondary | ICD-10-CM

## 2023-02-15 DIAGNOSIS — G43909 Migraine, unspecified, not intractable, without status migrainosus: Secondary | ICD-10-CM | POA: Diagnosis not present

## 2023-02-15 MED ORDER — BUPROPION HCL ER (SR) 200 MG PO TB12
200.0000 mg | ORAL_TABLET | Freq: Two times a day (BID) | ORAL | 1 refills | Status: DC
  Filled 2023-02-15: qty 180, 90d supply, fill #0

## 2023-02-15 MED ORDER — TOPIRAMATE 25 MG PO TABS
50.0000 mg | ORAL_TABLET | Freq: Every evening | ORAL | 0 refills | Status: DC
  Filled 2023-02-15: qty 60, 30d supply, fill #0

## 2023-02-15 MED ORDER — VITAMIN D (ERGOCALCIFEROL) 1.25 MG (50000 UNIT) PO CAPS
50000.0000 [IU] | ORAL_CAPSULE | ORAL | 0 refills | Status: DC
  Filled 2023-02-15: qty 5, 35d supply, fill #0

## 2023-02-15 NOTE — Progress Notes (Signed)
 SUBJECTIVE: Discussed the use of AI scribe software for clinical note transcription with the patient, who gave verbal consent to proceed.  Chief Complaint: Obesity  Interim History: She is up 1 lb since her last visit.  Sherri Cobb is a 43 year old female who presents for follow-up of her obesity treatment plan.  She is managing her obesity with lifestyle modifications, though cold weather affects her motivation to stay active. She is incorporating more movement into her routine by listening to podcasts about movement and using micro-movements. She is on bupropion  200 mg daily for emotional eating and Topamax  50 mg at bedtime for cravings. Bupropion  slightly improves her energy levels and curbs cravings. She is adjusting the timing of Topamax , as taking it too late reduces its effectiveness for evening cravings, and she is concerned about its impact on her energy levels during her evening routine.  She experiences 'food noise' and struggles with late-night binge eating. Wegovy  was effective in eliminating her food noise, but she is currently ineligible for it due to her BMI of 28, which frustrates her given her past success with the medication.  She generally sleeps well, but sciatica pain can disrupt her sleep. She takes gabapentin  as prescribed, which helps when she remembers to take it. She is a side sleeper, and the pain runs down her hip, causing discomfort at times.  She is not currently journaling her food intake but follows a category two eating plan. When she adheres to the plan, her hunger is more controlled, though she still experiences cravings. She is aware of the importance of strength training as she approaches perimenopause and is considering hormonal therapy in the future.  Jennafer is here to discuss her progress with her obesity treatment plan. She is on the Category 2 Plan and states she is following her eating plan approximately 70 % of the time. She states she is  exercising walking/jogging, treadmill/ yoga/Strength training 60 minutes 2-3 times per week.   Plan fasting labs next visit.   OBJECTIVE: Visit Diagnoses: Problem List Items Addressed This Visit     Migraine headache   Relevant Medications   buPROPion  (WELLBUTRIN  SR) 200 MG 12 hr tablet   topiramate  (TOPAMAX ) 25 MG tablet   Generalized obesity   Vitamin D  insufficiency   Relevant Medications   Vitamin D , Ergocalciferol , (DRISDOL ) 1.25 MG (50000 UNIT) CAPS capsule   Depression   Relevant Medications   buPROPion  (WELLBUTRIN  SR) 200 MG 12 hr tablet   topiramate  (TOPAMAX ) 25 MG tablet   BMI 28.0-28.9,adult   Other Visit Diagnoses       Polyphagia    -  Primary     Obesity Sherri Cobb, a 43 year old female, is seen for obesity management. She is on ergocalciferol  50,000 units weekly for vitamin D  deficiency, bupropion  200 mg daily for emotional eating, and topiramate  50 mg at bedtime for cravings. Bupropion  slightly improves energy levels and cravings, while topiramate  helps with late-night binge eating but needs earlier administration. Discussed micro-movements and staying active despite cold weather. Consider increasing bupropion  to twice daily to enhance energy and reduce cravings. Emphasized not abruptly stopping bupropion  to avoid withdrawal. Potential future use of Wegovy  if BMI criteria are met. Discussed not being hard on herself over food noise and potential future oral medications for cravings. - Increase bupropion  to 200 mg twice daily - Renew prescriptions for topiramate  and ergocalciferol  - Adjust timing of topiramate  - Continue micro-movements and staying active  Sciatica Reports sciatica pain radiating down  the hip, interfering with sleep. Managed with gabapentin  as needed. Discussed benefits of a bedtime yoga routine. - Continue gabapentin  as needed - Recommend bedtime yoga routine with Adrienne  Vitamin D  Deficiency Managed with ergocalciferol  50,000 units  weekly. No new issues reported. - Refill prescription for ergocalciferol  50,000 units weekly.  Recheck labs next visit to optimize supplementation/avoid over supplementation.  Low vitamin D  levels can be associated with adiposity and may result in leptin resistance and weight gain. Also associated with fatigue. Currently on vitamin D  supplementation without any adverse effects.    Eating disorder/emotional eating Sherri Cobb has had issues with stress/emotional eating. Currently this is fair to moderately controlled. Overall mood is stable. Medication(s): Bupropion  SR 200 mg daily in am  Plan: Continue and increase dose Bupropion  SR 200 mg twice daily and continue and refill topiramate  50 mg in evenings daily.    Perimenopause At 43 years old, she is considering perimenopause. Discussed strength training for muscle mass and metabolic health. Mentioned potential future hormonal therapy for symptom management, cancer risk reduction, and bone health improvement. - Discuss hormonal therapy options as needed - Encourage strength training  General Health Maintenance Discussed regular health maintenance. Last labs in October. Plan for fasting labs at next visit. - Schedule fasting labs for next visit, including A1c, insulin , chemistries, vitamin D , thyroid   Follow-up - Follow-up appointment on March 11 at 9:00 AM - Perform fasting labs at next visit.  Vitals Temp: 98.4 F (36.9 C) BP: 104/72 Pulse Rate: 89 SpO2: 98 %   Anthropometric Measurements Height: 5' 6 (1.676 m) Weight: 175 lb (79.4 kg) BMI (Calculated): 28.26 Weight at Last Visit: 174 lb Weight Lost Since Last Visit: 0 Weight Gained Since Last Visit: 1 lb Starting Weight: 188 lb Total Weight Loss (lbs): 13 lb (5.897 kg)   Body Composition  Body Fat %: 34.4 % Fat Mass (lbs): 60.4 lbs Muscle Mass (lbs): 109.2 lbs Total Body Water (lbs): 75.8 lbs Visceral Fat Rating : 6   Other Clinical Data Fasting: no Labs:  no Today's Visit #: 25 Starting Date: 11/12/20     ASSESSMENT AND PLAN:  Diet: Daneya is currently in the action stage of change. As such, her goal is to continue with weight loss efforts. She has agreed to Category 2 Plan.  Exercise: Niyati has been instructed to continue exercising as is for weight loss and overall health benefits.   Behavior Modification:  We discussed the following Behavioral Modification Strategies today: increasing lean protein intake, decreasing simple carbohydrates, increasing vegetables, increase H2O intake, increase high fiber foods, meal planning and cooking strategies, emotional eating strategies , avoiding temptations, and planning for success. We discussed various medication options to help Rikia with her weight loss efforts and we both agreed to increase Wellbutrin  SR to 200 mg twice daily and continue topiramate  for emotional eating/cravings and continue to work on nutritional and behavioral strategies to promote weight loss.   .  Return in about 4 weeks (around 03/15/2023) for Fasting Lab.SABRA She was informed of the importance of frequent follow up visits to maximize her success with intensive lifestyle modifications for her multiple health conditions.  Attestation Statements:   Reviewed by clinician on day of visit: allergies, medications, problem list, medical history, surgical history, family history, social history, and previous encounter notes.   Time spent on visit including pre-visit chart review and post-visit care and charting was 32 minutes.    Maday Guarino, PA-C

## 2023-02-23 ENCOUNTER — Ambulatory Visit
Admission: RE | Admit: 2023-02-23 | Discharge: 2023-02-23 | Disposition: A | Discharge status: Home / Self Care | Payer: Medicaid Other | Source: Ambulatory Visit | Attending: Family Medicine | Admitting: Family Medicine

## 2023-02-23 DIAGNOSIS — Z1231 Encounter for screening mammogram for malignant neoplasm of breast: Secondary | ICD-10-CM

## 2023-02-25 ENCOUNTER — Encounter: Payer: Self-pay | Admitting: Family Medicine

## 2023-03-22 ENCOUNTER — Encounter (INDEPENDENT_AMBULATORY_CARE_PROVIDER_SITE_OTHER): Payer: Self-pay | Admitting: Physician Assistant

## 2023-03-22 ENCOUNTER — Ambulatory Visit (INDEPENDENT_AMBULATORY_CARE_PROVIDER_SITE_OTHER): Payer: Medicaid Other | Admitting: Physician Assistant

## 2023-03-22 ENCOUNTER — Other Ambulatory Visit: Payer: Self-pay

## 2023-03-22 ENCOUNTER — Other Ambulatory Visit (HOSPITAL_COMMUNITY): Payer: Self-pay

## 2023-03-22 VITALS — BP 102/62 | HR 75 | Temp 98.2°F | Ht 66.0 in | Wt 182.0 lb

## 2023-03-22 DIAGNOSIS — F3289 Other specified depressive episodes: Secondary | ICD-10-CM

## 2023-03-22 DIAGNOSIS — R5383 Other fatigue: Secondary | ICD-10-CM

## 2023-03-22 DIAGNOSIS — Z6829 Body mass index (BMI) 29.0-29.9, adult: Secondary | ICD-10-CM

## 2023-03-22 DIAGNOSIS — E559 Vitamin D deficiency, unspecified: Secondary | ICD-10-CM

## 2023-03-22 DIAGNOSIS — R632 Polyphagia: Secondary | ICD-10-CM

## 2023-03-22 DIAGNOSIS — J3089 Other allergic rhinitis: Secondary | ICD-10-CM

## 2023-03-22 DIAGNOSIS — E7849 Other hyperlipidemia: Secondary | ICD-10-CM

## 2023-03-22 DIAGNOSIS — E88819 Insulin resistance, unspecified: Secondary | ICD-10-CM | POA: Diagnosis not present

## 2023-03-22 DIAGNOSIS — F32A Depression, unspecified: Secondary | ICD-10-CM | POA: Diagnosis not present

## 2023-03-22 DIAGNOSIS — E669 Obesity, unspecified: Secondary | ICD-10-CM

## 2023-03-22 MED ORDER — FLUTICASONE PROPIONATE 50 MCG/ACT NA SUSP
2.0000 | Freq: Every day | NASAL | 6 refills | Status: DC
  Filled 2023-03-22: qty 16, 30d supply, fill #0
  Filled 2023-04-15: qty 16, 30d supply, fill #1

## 2023-03-22 MED ORDER — LORATADINE-PSEUDOEPHEDRINE ER 10-240 MG PO TB24
1.0000 | ORAL_TABLET | Freq: Every day | ORAL | 2 refills | Status: DC
  Filled 2023-03-22: qty 30, 30d supply, fill #0
  Filled 2023-04-15: qty 30, 30d supply, fill #1

## 2023-03-22 MED ORDER — BUPROPION HCL ER (SR) 200 MG PO TB12
200.0000 mg | ORAL_TABLET | Freq: Two times a day (BID) | ORAL | 1 refills | Status: DC
  Filled 2023-03-22: qty 180, 90d supply, fill #0

## 2023-03-22 MED ORDER — TOPIRAMATE 25 MG PO TABS
50.0000 mg | ORAL_TABLET | Freq: Every evening | ORAL | 0 refills | Status: DC
  Filled 2023-03-22: qty 60, 30d supply, fill #0

## 2023-03-22 MED ORDER — VITAMIN D (ERGOCALCIFEROL) 1.25 MG (50000 UNIT) PO CAPS
50000.0000 [IU] | ORAL_CAPSULE | ORAL | 0 refills | Status: DC
  Filled 2023-03-22: qty 5, 35d supply, fill #0

## 2023-03-22 NOTE — Progress Notes (Signed)
 SUBJECTIVE: Discussed the use of AI scribe software for clinical note transcription with the patient, who gave verbal consent to proceed.   Chief Complaint: Obesity  Interim History: She is up 7 lb since last visit.    Sherri Cobb is here to discuss her progress with her obesity treatment plan. She is on the keeping a food journal and adhering to recommended goals of 1250 calories and 85 grams of protein and states she is following her eating plan approximately 80 % of the time. She states she is exercising  walking/jogging/yoga/pilates 30-60 minutes 3-4 times per week.  The patient is a 43 year old with obesity who presents for follow-up of her obesity treatment plan.  She has a history of polyphagia and emotional eating behaviors, experiencing increased hunger signals, particularly in the evenings, with a strong desire for carbohydrates. She finds it challenging to avoid carbohydrates and focus on protein, often opting for [place] yogurt with fruit or a protein-rich breakfast bowl from [employer]. Topiramate helps with appetite control but not as significantly as previous medications like Wegovy.  She has increased her physical activity, aiming for one-hour workouts but often managing only 20 minutes. Despite this, she has gained muscle mass, indicating adequate protein intake. She has no trouble sleeping and takes topiramate at bedtime, which she believes may contribute to her sleepiness.  Her current medications include topiramate 50 mg at bedtime, bupropion SR 200 mg twice daily, ergocalciferol 50,000 units once weekly, and vitamin B12 5,000 mcg daily. She manages her allergies with over-the-counter Allegra, although she finds it expensive, and has previously used Clarinex with good results.  She experiences severe allergy symptoms in the spring, which affect her ability to engage in outdoor activities like hiking. She has a history of severe allergic reactions, including welts and sneezing  fits, and has undergone allergy testing in the past, revealing allergies to trees, grass, and cats.  She is a single mother of a 75-year-old and a 9 year old, which limits her time for personal activities. She wants to engage in hobbies like hiking and painting but finds it challenging to balance these with her responsibilities.  Fasting labs obtained this visit.  The patient was informed we would discuss the lab results at the next visit unless there is a critical issue that needs to be addressed sooner. The patient agreed to keep the next visit at the agreed upon time to discuss these results.   OBJECTIVE: Visit Diagnoses: Problem List Items Addressed This Visit     Perennial allergic rhinitis   Relevant Medications   loratadine-pseudoephedrine (CLARITIN-D 24-HOUR) 10-240 MG 24 hr tablet   fluticasone (FLONASE) 50 MCG/ACT nasal spray   Migraine headache   Relevant Medications   buPROPion (WELLBUTRIN SR) 200 MG 12 hr tablet   topiramate (TOPAMAX) 25 MG tablet   Generalized obesity   Vitamin D insufficiency   Relevant Medications   Vitamin D, Ergocalciferol, (DRISDOL) 1.25 MG (50000 UNIT) CAPS capsule   Other Relevant Orders   VITAMIN D 25 Hydroxy (Vit-D Deficiency, Fractures)   Depression   Relevant Medications   buPROPion (WELLBUTRIN SR) 200 MG 12 hr tablet   topiramate (TOPAMAX) 25 MG tablet   Other Visit Diagnoses       Polyphagia    -  Primary     Other hyperlipidemia       Relevant Orders   Lipid Panel With LDL/HDL Ratio     Other fatigue       Relevant Orders   Vitamin B12  TSH     Insulin resistance       Relevant Orders   CMP14+EGFR   Hemoglobin A1c   Insulin, random     BMI 29.0-29.9,adult Current BMI 29.4          Obesity with polyphagia She is being followed for obesity management. She has gained muscle mass and adipose tissue. Reports increased activity but struggles with adaptive thermogenesis, leading to increased hunger signals. Experiences increased  appetite, especially in the evenings, with cravings for carbohydrates and sweets. Current medication includes topiramate and bupropion SR, which she feels helps but not as effectively as previous treatment with Wegovy. Adjusting the timing and dosage of topiramate is suggested to manage cravings and hunger more effectively. Incorporating HIIT workouts is recommended to enhance adipose tissue utilization for energy. - Experiment with taking topiramate in the afternoon and adjust dosage as needed. - Incorporate HIIT workouts a couple of times a week. - Ensure protein intake of at least 85 grams per day. - Consider increasing calorie intake temporarily with protein-based snacks to confuse metabolism. - Provide a grocery list for protein-based snacks.   Insulin Resistance Last fasting insulin was 10.9- not at goal. A1c was 5.2. Polyphagia:Yes Medication(s): None Previously on Wegovy with excellent result. But insurance no longer covering.  Lab Results  Component Value Date   HGBA1C 5.2 04/01/2022   HGBA1C 5.0 12/09/2021   HGBA1C 5.1 06/03/2021   HGBA1C 5.1 11/13/2020   Lab Results  Component Value Date   INSULIN 10.9 04/01/2022   INSULIN 11.4 12/09/2021   INSULIN 10.9 06/03/2021   INSULIN 13.2 11/13/2020    Plan:  Continue working on nutrition plan to decrease simple carbohydrates, increase lean proteins and exercise to promote weight loss, improve glycemic control and prevent progression to Type 2 diabetes.  Recheck lasting labs today.   Vitamin D insufficiency She is on ergocalciferol 50,000 units once weekly. Plans to recheck vitamin D levels to assess supplementation effectiveness. - Recheck vitamin D levels.  Eating disorder/emotional eating Sherri Cobb has had issues with stress/emotional eating. Currently this is fair to moderately controlled. Overall mood is stable. Medication(s): Bupropion SR 200 mg twice daily and Topiramate 50 mg nightly  Plan: Continue and refill  Bupropion SR 200 mg twice daily and Topiramate up to 75 mg daily  Disucssed options for timing and amount of dose of topiramate over the next few weeks and will monitor results   Hyperlipidemia LDL is not at goal. Medication(s): None Cardiovascular risk factors: dyslipidemia and family history of premature cardiovascular disease  Lab Results  Component Value Date   CHOL 162 04/01/2022   HDL 45 04/01/2022   LDLCALC 104 (H) 04/01/2022   TRIG 68 04/01/2022   Lab Results  Component Value Date   ALT 12 10/15/2022   AST 12 10/15/2022   ALKPHOS 38 (L) 10/15/2022   BILITOT 0.5 10/15/2022   The 10-year ASCVD risk score (Arnett DK, et al., 2019) is: 0.4%   Values used to calculate the score:     Age: 7 years     Sex: Female     Is Non-Hispanic African American: No     Diabetic: No     Tobacco smoker: No     Systolic Blood Pressure: 102 mmHg     Is BP treated: No     HDL Cholesterol: 45 mg/dL     Total Cholesterol: 162 mg/dL  Plan: Statin not indicated by most recent labs Continue to work on nutrition plan -decreasing simple carbohydrates,  increasing lean proteins, decreasing saturated fats and cholesterol , avoiding trans fats and exercise as able to promote weight loss, improve lipids and decrease cardiovascular risks. Recheck fasting lipids today.   Seasonal Allergies She experiences seasonal allergies, hindering outdoor activities like hiking. Previously used Clarinex with good results and is currently using over-the-counter Allegra, which is expensive. Plans to prescribe Claritin D for more effective and cost-efficient management. Discussed nasal sprays for acute allergy attacks and the potential benefit of a decongestant in the regimen. - Prescribe Claritin D 24 hr. Once daily to use while symptomatic. -Flonase spray each nare daily while symptomatic this spring.    Eating disorder/emotional eating Sherri Cobb has had issues with stress/emotional eating. Currently this is  moderately controlled. Overall mood is stable. Medication(s): Bupropion SR 200 mg twice daily and Topiramate 50 mg nightly  Plan: Continue and refill Bupropion SR 200 mg twice daily and Topiramate 25 mg 2 tablets daily  Fatigue Endorses fatigue Check labs- B12, CBC and other labs as noted.    Follow-up She is scheduled for follow-up to assess treatment plan effectiveness and make necessary adjustments. Blood work is planned to monitor various health parameters. - Schedule follow-up appointment for April 9th at 12:30 PM. - Conduct blood work including B12, chemistries, A1c, insulin level, lipids, vitamin D, and TSH. Vitals Temp: 98.2 F (36.8 C) BP: 102/62 Pulse Rate: 75 SpO2: 98 %   Anthropometric Measurements Height: 5\' 6"  (1.676 m) Weight: 182 lb (82.6 kg) BMI (Calculated): 29.39 Weight at Last Visit: 175lb Weight Lost Since Last Visit: 0 Weight Gained Since Last Visit: 7lb Starting Weight: 188lb Total Weight Loss (lbs): 6 lb (2.722 kg)   Body Composition  Body Fat %: 35.6 % Fat Mass (lbs): 65 lbs Muscle Mass (lbs): 111.4 lbs Total Body Water (lbs): 78.4 lbs Visceral Fat Rating : 7   Other Clinical Data Fasting: yes Labs: no Today's Visit #: 26 Starting Date: 11/12/20     ASSESSMENT AND PLAN:  Diet: Sherri Cobb is currently in the action stage of change. As such, her goal is to continue with weight loss efforts and has agreed to the Category 2 Plan and keeping a food journal and adhering to recommended goals of 1250 calories and 85 grams of protein.   Exercise:  For substantial health benefits, adults should do at least 150 minutes (2 hours and 30 minutes) a week of moderate-intensity, or 75 minutes (1 hour and 15 minutes) a week of vigorous-intensity aerobic physical activity, or an equivalent combination of moderate- and vigorous-intensity aerobic activity. Aerobic activity should be performed in episodes of at least 10 minutes, and preferably, it should be  spread throughout the week., Adults should also include muscle-strengthening activities that involve all major muscle groups on 2 or more days a week., and Consider HIIT work out for 20 minutes 2-3 times weekly  Behavior Modification:  We discussed the following Behavioral Modification Strategies today: increasing lean protein intake, decreasing simple carbohydrates, increasing vegetables, increase H2O intake, increase high fiber foods, meal planning and cooking strategies, ways to avoid boredom eating, emotional eating strategies , avoiding temptations, planning for success, and keep a strict food journal. We discussed various medication options to help Sherri Cobb with her weight loss efforts and we both agreed to continue wellbutrin and topiramate for emotional eating and continue to work on nutritional and behavioral strategies to promote weight loss.  .  Return in about 4 weeks (around 04/19/2023).Marland Kitchen She was informed of the importance of frequent follow up visits to  maximize her success with intensive lifestyle modifications for her multiple health conditions.  Attestation Statements:   Reviewed by clinician on day of visit: allergies, medications, problem list, medical history, surgical history, family history, social history, and previous encounter notes.   Time spent on visit including pre-visit chart review and post-visit care and charting was 48 minutes  Sherri Gonsalez,PA-C

## 2023-03-23 LAB — LIPID PANEL WITH LDL/HDL RATIO
Cholesterol, Total: 203 mg/dL — ABNORMAL HIGH (ref 100–199)
HDL: 60 mg/dL (ref >39)
LDL Chol Calc (NIH): 122 mg/dL — ABNORMAL HIGH (ref 0–99)
LDL/HDL Ratio: 2 ratio (ref 0.0–3.2)
Triglycerides: 117 mg/dL (ref 0–149)
VLDL Cholesterol Cal: 21 mg/dL (ref 5–40)

## 2023-03-23 LAB — CMP14+EGFR
ALT: 12 IU/L (ref 0–32)
AST: 14 IU/L (ref 0–40)
Albumin: 4.4 g/dL (ref 3.9–4.9)
Alkaline Phosphatase: 42 IU/L — ABNORMAL LOW (ref 44–121)
BUN/Creatinine Ratio: 16 (ref 9–23)
BUN: 13 mg/dL (ref 6–24)
Bilirubin Total: 0.5 mg/dL (ref 0.0–1.2)
CO2: 23 mmol/L (ref 20–29)
Calcium: 9.4 mg/dL (ref 8.7–10.2)
Chloride: 104 mmol/L (ref 96–106)
Creatinine, Ser: 0.81 mg/dL (ref 0.57–1.00)
Globulin, Total: 2.6 g/dL (ref 1.5–4.5)
Glucose: 84 mg/dL (ref 70–99)
Potassium: 4.3 mmol/L (ref 3.5–5.2)
Sodium: 141 mmol/L (ref 134–144)
Total Protein: 7 g/dL (ref 6.0–8.5)
eGFR: 94 mL/min/{1.73_m2} (ref >59)

## 2023-03-23 LAB — INSULIN, RANDOM: INSULIN: 6.5 u[IU]/mL (ref 2.6–24.9)

## 2023-03-23 LAB — HEMOGLOBIN A1C
Est. average glucose Bld gHb Est-mCnc: 100 mg/dL
Hgb A1c MFr Bld: 5.1 % (ref 4.8–5.6)

## 2023-03-23 LAB — VITAMIN B12: Vitamin B-12: 368 pg/mL (ref 232–1245)

## 2023-03-23 LAB — VITAMIN D 25 HYDROXY (VIT D DEFICIENCY, FRACTURES): Vit D, 25-Hydroxy: 40 ng/mL (ref 30.0–100.0)

## 2023-03-23 LAB — TSH: TSH: 1.72 u[IU]/mL (ref 0.450–4.500)

## 2023-03-23 NOTE — Progress Notes (Unsigned)
 [name] [address] [place] [address] Phone: [phone] Subjective:   INadine Counts, am serving as a scribe for Dr. Antoine Primas.  I'm seeing this patient by the request  of:  Kuneff, Renee A, DO  CC: back and neck pain follow up   [id number]  Sherri Cobb is a 43 y.o. female coming in with complaint of back and neck pain. OMT on 02/03/2023. Patient states same no new changes.   Medications patient has been prescribed:   Taking:         Reviewed prior external information including notes and imaging from previsou exam, outside providers and external EMR if available.   As well as notes that were available from care everywhere and other healthcare systems.  Past medical history, social, surgical and family history all reviewed in electronic medical record.  No pertanent information unless stated regarding to the chief complaint.   Past Medical History:  Diagnosis Date   Allergies    Allergy    Back pain    Chicken pox    Depression 02/04/2022   Fatty liver    Gallbladder problem    Hx of vaginitis 12/10/2017   Recurrent yeast with Mirena IUD Reported history of uterine infection, but unable to find that in her Care Everywhere chart.   Insulin resistance 12/09/2021   Lactose intolerance    Menometrorrhagia    Before BCPs   Migraines    Psoriasis     No Known Allergies   Review of Systems:  No headache, visual changes, nausea, vomiting, diarrhea, constipation, dizziness, abdominal pain, skin rash, fevers, chills, night sweats, weight loss, swollen lymph nodes, body aches, joint swelling, chest pain, shortness of breath, mood changes. POSITIVE muscle aches  Objective  Blood pressure 110/76, pulse 65, height 5\' 6"  (1.676 m), weight 184 lb (83.5 kg), last menstrual period 03/08/2023, SpO2 97%.   General: No apparent distress alert and oriented x3 mood and affect normal, dressed appropriately.  HEENT: Pupils  equal, extraocular movements intact  Respiratory: Patient's speak in full sentences and does not appear short of breath  Cardiovascular: No lower extremity edema, non tender, no erythema  Gait MSK:  Back pain more tightness still noted over the right sacroiliac joint than anywhere else at the moment.  Negative straight leg test noted.  Osteopathic findings  C3 flexed rotated and side bent right C6 flexed rotated and side bent left T3 extended rotated and side bent right inhaled rib T8 extended rotated and side bent left L1 flexed rotated and side bent right Sacrum right on right     Assessment and Plan:  Hypermobility arthralgia Continues to have the hypermobility arthralgia.  Will continue to monitor.  Patient is doing well with stable weight loss.  BMI is under 30.  Encouraged her to continue to work on core strengthening and strengthening discussed hip abductor strengthening.  Follow-up again in 6 to 8 weeks otherwise    Nonallopathic problems  Decision today to treat with OMT was based on Physical Exam  After verbal consent patient was treated with HVLA, ME, FPR techniques in cervical, rib, thoracic, lumbar, and sacral  areas  Patient tolerated the procedure well with improvement in symptoms  Patient given exercises, stretches and lifestyle modifications  See medications in patient instructions if given  Patient will follow up in 4-8 weeks    The above documentation has been reviewed and is accurate and complete [name], DO  Note: This dictation was prepared with Dragon dictation along with smaller phrase technology. Any transcriptional errors that result from this process are unintentional.

## 2023-03-24 ENCOUNTER — Encounter: Payer: Self-pay | Admitting: Family Medicine

## 2023-03-24 ENCOUNTER — Ambulatory Visit: Payer: Medicaid Other | Admitting: Family Medicine

## 2023-03-24 VITALS — BP 110/76 | HR 65 | Ht 66.0 in | Wt 184.0 lb

## 2023-03-24 DIAGNOSIS — M9901 Segmental and somatic dysfunction of cervical region: Secondary | ICD-10-CM

## 2023-03-24 DIAGNOSIS — M9903 Segmental and somatic dysfunction of lumbar region: Secondary | ICD-10-CM | POA: Diagnosis not present

## 2023-03-24 DIAGNOSIS — M9904 Segmental and somatic dysfunction of sacral region: Secondary | ICD-10-CM

## 2023-03-24 DIAGNOSIS — M9908 Segmental and somatic dysfunction of rib cage: Secondary | ICD-10-CM

## 2023-03-24 DIAGNOSIS — M9902 Segmental and somatic dysfunction of thoracic region: Secondary | ICD-10-CM | POA: Diagnosis not present

## 2023-03-24 DIAGNOSIS — M2550 Pain in unspecified joint: Secondary | ICD-10-CM | POA: Diagnosis not present

## 2023-03-24 NOTE — Patient Instructions (Signed)
 Good to see you! Good luck shopping for prom See you again in 2 months

## 2023-03-24 NOTE — Assessment & Plan Note (Signed)
 Continues to have the hypermobility arthralgia.  Will continue to monitor.  Patient is doing well with stable weight loss.  BMI is under 30.  Encouraged her to continue to work on core strengthening and strengthening discussed hip abductor strengthening.  Follow-up again in 6 to 8 weeks otherwise

## 2023-04-15 ENCOUNTER — Other Ambulatory Visit: Payer: Self-pay

## 2023-04-15 ENCOUNTER — Other Ambulatory Visit (HOSPITAL_COMMUNITY): Payer: Self-pay

## 2023-04-18 ENCOUNTER — Other Ambulatory Visit (HOSPITAL_COMMUNITY): Payer: Self-pay

## 2023-04-19 NOTE — Progress Notes (Signed)
 SUBJECTIVE: Discussed the use of AI scribe software for clinical note transcription with the patient, who gave verbal consent to proceed.  Chief Complaint: Obesity  Interim History: She has maintained her weight since her last visit.   Sherri Cobb is here to discuss her progress with her obesity treatment plan. She is on the keeping a food journal and adhering to recommended goals of 1250 calories and 80 protein and states she is following her eating plan approximately 50 % of the time. She states she is exercising Pilate's, walking, yoga and HIIT for 30 minutes each day 4 times per week.  Sherri Cobb is a 43 year old female who presents for follow-up of her obesity treatment plan.  She has a history of insulin resistance, vitamin D deficiency, polyphagia, hyperlipidemia, and perennial allergic rhinitis. Over the past several weeks, she has maintained her weight loss and built muscle mass while losing adipose mass. Her BMI is currently 29, and her overall body adipose percentage has decreased to 34.7%.  Her weight has remained stable, but she has gained four pounds of muscle mass and lost nearly two pounds of adipose tissue. Her visceral adipose rating has decreased to six.  With the arrival of spring and warmer weather, she becomes more active, and her diet improves with more fresh produce available. She has been tracking her food intake more consistently, realizing she is exceeding her calorie goals despite doing well with protein intake. She attributes this to increased activity and exercise, which makes her feel hungrier.  Recent lab results show lower cholesterol levels, with normal electrolytes and kidney function. However, her alkaline phosphatase is slightly low. Her LDL and total cholesterol levels have increased, and she has been consuming more pastries and red meat to meet her protein needs. She is considering increasing her fiber intake to help manage her cholesterol levels.  She  is currently taking vitamin D, Wellbutrin, Flonase, and Claritin D. She also takes topiramate, 50 mg, two tablets before cooking dinner to help with appetite suppression. She has not been on metformin.  She experiences fatigue, both physical and mental, and has incorporated more movement into her day. She sleeps well at night but relies on gabapentin prescribed for sciatica, which keeps her up if not taken. She is concerned about dependency on gabapentin for sleep. OBJECTIVE: Visit Diagnoses: Problem List Items Addressed This Visit     Perennial allergic rhinitis   Relevant Medications   fluticasone (FLONASE) 50 MCG/ACT nasal spray   loratadine-pseudoephedrine (CLARITIN-D 24-HOUR) 10-240 MG 24 hr tablet   Generalized obesity   Relevant Medications   Semaglutide-Weight Management (WEGOVY) 0.25 MG/0.5ML SOAJ   Vitamin D insufficiency   Relevant Medications   Vitamin D, Ergocalciferol, (DRISDOL) 1.25 MG (50000 UNIT) CAPS capsule   B12 deficiency   Relevant Medications   vitamin B-12 (CYANOCOBALAMIN) 500 MCG tablet   Depression   Relevant Medications   buPROPion (WELLBUTRIN SR) 200 MG 12 hr tablet   topiramate (TOPAMAX) 25 MG tablet   Other Visit Diagnoses       Insulin resistance    -  Primary     Obesity She has maintained weight loss, increased muscle mass, and reduced adipose tissue, with a BMI of 29 and body fat percentage of 34.7%. She is aware of her caloric intake, improving diet quality, and tracking food intake. Discussion included potential re-evaluation of Wegovy approval for weight loss, considering her BMI is close to the threshold for approval. The cost of self-pay for [facility]  was discussed, with doses available at $499 per month. - Continue current weight management plan - Consider re-evaluation of Wegovy approval for weight loss - Continue bupropion and topiramate for appetite suppression - Encourage increased dietary fiber intake - Encourage plant-based protein  sources  Hyperlipidemia Cholesterol levels have increased, with elevated LDL and total cholesterol, although HDL has improved due to increased exercise. She admits to consuming more saturated fats, particularly from pastries and red meat, contributing to elevated cholesterol levels. Increasing dietary fiber was discussed as a natural way to help lower cholesterol, with suggestions to incorporate more beans and oats into her diet. - Encourage reduction of saturated fat intake - Increase dietary fiber intake - Consider psyllium husk supplementation - Encourage plant-based protein sources  Insulin Resistance Insulin resistance appears to be improving, with an A1c of 5.1 and insulin levels down to 6.5. Metformin was discussed as a potential treatment, but given the current improvement, it may not be necessary at this time. - Monitor insulin levels and A1c - Consider metformin if insulin resistance worsens  Sciatica She experiences sciatica, managed with gabapentin prescribed by her pain doctor. She expresses concern about dependency on gabapentin for sleep due to sciatica pain. Various non-pharmacological treatments, including exercise and massage therapy, have been tried, but the pain persists. The potential for a disc herniation was discussed, and the benefits of a neurosurgical evaluation were considered, especially given her symptoms of numbness and tingling and weakness at times. - Consider referral to neurosurgery for evaluation of potential disc herniation - Discuss with pain management specialist about long-term gabapentin use   Vitamin D Deficiency Vitamin D levels are improving but still on the lower end of the desired range. She is compliant with her vitamin D supplementation. - Continue vitamin D supplementation  Perennial Allergic Rhinitis She is managing her perennial allergic rhinitis with Flonase and Claritin D. - Continue Flonase and Claritin D  Vitals Temp: 98.1 F (36.7  C) BP: 103/71 Pulse Rate: 74 SpO2: 98 %   Anthropometric Measurements Height: 5\' 6"  (1.676 m) Weight: 182 lb (82.6 kg) BMI (Calculated): 29.39 Weight at Last Visit: 182 lb Weight Lost Since Last Visit: 0 Weight Gained Since Last Visit: 0 Starting Weight: 188 lb Total Weight Loss (lbs): 6 lb (2.722 kg) Peak Weight: 195 lb   Body Composition  Body Fat %: 34.7 % Fat Mass (lbs): 63.2 lbs Muscle Mass (lbs): 112.8 lbs Total Body Water (lbs): 75.4 lbs Visceral Fat Rating : 6   Other Clinical Data Fasting: no Labs: no Today's Visit #: 27 Starting Date: 11/12/20     ASSESSMENT AND PLAN:  Diet: Avanthika is currently in the action stage of change. As such, her goal is to continue with weight loss efforts. She has agreed to keeping a food journal and adhering to recommended goals of 1250 calories and 80 grams of protein.  Exercise: Yezenia has been instructed to continue exercising as is for weight loss and overall health benefits.   Behavior Modification:  We discussed the following Behavioral Modification Strategies today: increasing lean protein intake, decreasing simple carbohydrates, increasing vegetables, increase H2O intake, increase high fiber foods, avoiding temptations, planning for success, and keep a strict food journal. We discussed various medication options to help Ying with her weight loss efforts and we both agreed to start [facility] for medical weight loss and continue current other regimens and continue to work on nutritional and behavioral strategies to promote weight loss.  .  Return in about 4 weeks (around  05/18/2023).Sherri Cobb She was informed of the importance of frequent follow up visits to maximize her success with intensive lifestyle modifications for her multiple health conditions.  Attestation Statements:   Reviewed by clinician on day of visit: allergies, medications, problem list, medical history, surgical history, family history, social history, and  previous encounter notes.   Time spent on visit including pre-visit chart review and post-visit care and charting was 45 minutes.    Sherri Slaven, PA-C

## 2023-04-20 ENCOUNTER — Other Ambulatory Visit (HOSPITAL_COMMUNITY): Payer: Self-pay

## 2023-04-20 ENCOUNTER — Encounter (INDEPENDENT_AMBULATORY_CARE_PROVIDER_SITE_OTHER): Payer: Self-pay | Admitting: Physician Assistant

## 2023-04-20 ENCOUNTER — Ambulatory Visit (INDEPENDENT_AMBULATORY_CARE_PROVIDER_SITE_OTHER): Admitting: Physician Assistant

## 2023-04-20 ENCOUNTER — Other Ambulatory Visit: Payer: Self-pay

## 2023-04-20 VITALS — BP 103/71 | HR 74 | Temp 98.1°F | Ht 66.0 in | Wt 182.0 lb

## 2023-04-20 DIAGNOSIS — F32A Depression, unspecified: Secondary | ICD-10-CM

## 2023-04-20 DIAGNOSIS — E669 Obesity, unspecified: Secondary | ICD-10-CM

## 2023-04-20 DIAGNOSIS — E538 Deficiency of other specified B group vitamins: Secondary | ICD-10-CM

## 2023-04-20 DIAGNOSIS — E88819 Insulin resistance, unspecified: Secondary | ICD-10-CM

## 2023-04-20 DIAGNOSIS — E559 Vitamin D deficiency, unspecified: Secondary | ICD-10-CM | POA: Diagnosis not present

## 2023-04-20 DIAGNOSIS — J3089 Other allergic rhinitis: Secondary | ICD-10-CM | POA: Diagnosis not present

## 2023-04-20 DIAGNOSIS — F3289 Other specified depressive episodes: Secondary | ICD-10-CM

## 2023-04-20 DIAGNOSIS — Z6829 Body mass index (BMI) 29.0-29.9, adult: Secondary | ICD-10-CM

## 2023-04-20 MED ORDER — BUPROPION HCL ER (SR) 200 MG PO TB12
200.0000 mg | ORAL_TABLET | Freq: Two times a day (BID) | ORAL | 1 refills | Status: DC
  Filled 2023-04-20: qty 180, 90d supply, fill #0

## 2023-04-20 MED ORDER — VITAMIN D (ERGOCALCIFEROL) 1.25 MG (50000 UNIT) PO CAPS
50000.0000 [IU] | ORAL_CAPSULE | ORAL | 0 refills | Status: DC
  Filled 2023-04-20: qty 5, 35d supply, fill #0

## 2023-04-20 MED ORDER — FLUTICASONE PROPIONATE 50 MCG/ACT NA SUSP
2.0000 | Freq: Every day | NASAL | 6 refills | Status: DC
  Filled 2023-04-20: qty 16, 30d supply, fill #0

## 2023-04-20 MED ORDER — TOPIRAMATE 25 MG PO TABS
50.0000 mg | ORAL_TABLET | Freq: Every evening | ORAL | 0 refills | Status: DC
  Filled 2023-04-20: qty 60, 30d supply, fill #0

## 2023-04-20 MED ORDER — VITAMIN B-12 500 MCG PO TABS
500.0000 ug | ORAL_TABLET | Freq: Every day | ORAL | 0 refills | Status: DC
  Filled 2023-04-20: qty 100, 100d supply, fill #0

## 2023-04-20 MED ORDER — WEGOVY 0.25 MG/0.5ML ~~LOC~~ SOAJ
0.2500 mg | SUBCUTANEOUS | 0 refills | Status: DC
  Filled 2023-04-20: qty 2, 28d supply, fill #0

## 2023-04-20 MED ORDER — LORATADINE-PSEUDOEPHEDRINE ER 10-240 MG PO TB24
1.0000 | ORAL_TABLET | Freq: Every day | ORAL | 2 refills | Status: DC
  Filled 2023-04-20: qty 30, 30d supply, fill #0

## 2023-04-29 ENCOUNTER — Other Ambulatory Visit (HOSPITAL_COMMUNITY): Payer: Self-pay

## 2023-04-30 ENCOUNTER — Other Ambulatory Visit (HOSPITAL_COMMUNITY): Payer: Self-pay

## 2023-05-02 ENCOUNTER — Telehealth (INDEPENDENT_AMBULATORY_CARE_PROVIDER_SITE_OTHER): Payer: Self-pay

## 2023-05-02 NOTE — Telephone Encounter (Signed)
 Prior Auth for Wegovy  started, Obesity and pure hypercholesterolemia were the diagnosis used.  Awaiting determination.

## 2023-05-02 NOTE — Telephone Encounter (Signed)
 Your prior authorization for Wegovy  has been approved! More Info:  Message from plan: Approved. This drug has been approved. Approved quantity: 2 mLs per 28 day(s). You may fill up to a 34 day supply at a retail pharmacy. You may fill up to a 90 day supply for maintenance drugs, please refer to the formulary for details.  Please call the pharmacy to process your prescription claim.. Authorization Expiration Date: October 29, 2023.

## 2023-05-03 ENCOUNTER — Other Ambulatory Visit (HOSPITAL_COMMUNITY): Payer: Self-pay

## 2023-05-03 ENCOUNTER — Encounter (HOSPITAL_COMMUNITY): Payer: Self-pay

## 2023-05-10 ENCOUNTER — Ambulatory Visit (INDEPENDENT_AMBULATORY_CARE_PROVIDER_SITE_OTHER): Payer: Medicaid Other | Admitting: Family Medicine

## 2023-05-10 VITALS — BP 110/70 | HR 89 | Wt 189.2 lb

## 2023-05-10 DIAGNOSIS — F909 Attention-deficit hyperactivity disorder, unspecified type: Secondary | ICD-10-CM

## 2023-05-10 DIAGNOSIS — M5432 Sciatica, left side: Secondary | ICD-10-CM

## 2023-05-10 NOTE — Progress Notes (Signed)
 Patient ID: Sherri Cobb, female  DOB: 1980/03/08, 43 y.o.   MRN: [id number] Patient Care Team    Relationship Specialty Notifications Start End  Mariel Shope, DO PCP - General Family Medicine  11/14/20   Lorayne Rocks, DO Consulting Physician Bariatrics  11/14/20   Micheline Ahr, MD Consulting Physician Orthopedic Surgery  11/14/20   [facility] Nurse Practitioner Gynecology  11/14/20    Comment: Sherri Schaffer, NP    Chief Complaint  Patient presents with   ADHD    Follow up on chronic conditions.    Subjective: Sherri Cobb is a 43 y.o.  Female  present for attention and concentration deficit All past medical history, surgical history, allergies, family history, immunizations, medications and social history were updated in the electronic medical record today. All recent labs, ED visits and hospitalizations within the last year were reviewed.  Attention deficient: Today pt reports compliance with Wellbutrin  200 mg BID and feels med is working well. She is also taking med to help with weight loss, and the PA there has started filling prescription. . Patient was seen little over 1 month ago for her physical in which she expressed difficulty focusing.  Trial of Wellbutrin  150 mg XL was started and patient states she did see some improvement, but wonders if she could get better improvement with a stimulant addition. She was seen by weight loss counselor and Wellbutrin  was changed to 200 mg SR daily, to also help with appetite control.    Immunization History  Administered Date(s) Administered   Influenza, Seasonal, Injecte, Preservative Fre 10/15/2022   Influenza-Unspecified 12/10/2017, 09/14/2018, 10/11/2020   PFIZER(Purple Top)SARS-COV-2 Vaccination 03/23/2019, 04/16/2019, 12/15/2019   Pfizer Covid-19 Vaccine Bivalent Booster 62yrs & up 11/02/2020   Tdap 01/11/2014   Past Medical History:  Diagnosis Date   Allergies    Allergy    Back pain    Chicken  pox    Depression 02/04/2022   Fatty liver    Gallbladder problem    Hx of vaginitis 12/10/2017   Recurrent yeast with Mirena IUD Reported history of uterine infection, but unable to find that in her Care Everywhere chart.   Insulin  resistance 12/09/2021   Lactose intolerance    Menometrorrhagia    Before BCPs   Migraines    Psoriasis    No Known Allergies Past Surgical History:  Procedure Laterality Date   CHOLECYSTECTOMY N/A 07/12/2020   Procedure: LAPAROSCOPIC CHOLECYSTECTOMY;  Surgeon: Melvenia Stabs, MD;  Location: [facility];  Service: General;  Laterality: N/A;   KNEE ARTHROSCOPY WITH LATERAL RELEASE Right 12/27/2019   Procedure: KNEE ARTHROSCOPY WITH LATERAL RELEASE; REMOVAL LOOSE FOREIGN BODY; DEBRIDEMENT/SHAVING CHONDROPLASTY;  Surgeon: Micheline Ahr, MD;  Location: [facility];  Service: Orthopedics;  Laterality: Right;   LASIK Bilateral 2016   TIBIAL TUBERCLERPLASTY Right 12/27/2019   Procedure: LIGAMENT RECONSTRUCTION KNEE EXTRA-ARTICULAR, ANTERIOR TIBIAL TUBERCLEPLASTY, DECOMPRESSION FASCIOTOMY, LEG ANTERIOR;  Surgeon: Micheline Ahr, MD;  Location: [facility];  Service: Orthopedics;  Laterality: Right;   WISDOM TOOTH EXTRACTION     Family History  Problem Relation Age of Onset   Hypertension Mother    Hyperlipidemia Mother    Anxiety disorder Mother    Sleep apnea Mother    Thyroid  disease Father    Depression Father    Gout Father    Kidney Stones Father    Miscarriages / Stillbirths Sister    Anxiety disorder Daughter  Depression Daughter    ADD / ADHD Daughter    Hypertension Maternal Grandmother    Depression Maternal Grandmother    Skin cancer Maternal Grandmother    Diverticulitis Maternal Grandmother    BRCA 1/2 Neg Hx    Breast cancer Neg Hx    Social History   Social History Narrative   Marital status/children/pets: Single/Div., 2 daughters   Education/employment: associates degree, administrative work    Field seismologist:      -smoke alarm in the home:Yes     - wears seatbelt: Yes     - Feels safe in their relationships: Yes       Allergies as of 05/10/2023   No Known Allergies      Medication List        Accurate as of May 10, 2023  1:23 PM. If you have any questions, ask your nurse or doctor.          buPROPion  200 MG 12 hr tablet Commonly known as: Wellbutrin  SR Take 1 tablet (200 mg total) by mouth 2 (two) times daily.   fluocinonide  ointment 0.05 % Commonly known as: LIDEX  Apply 1 Application topically 2 (two) times daily.   fluticasone  50 MCG/ACT nasal spray Commonly known as: FLONASE  Place 2 sprays into both nostrils daily.   gabapentin  100 MG capsule Commonly known as: NEURONTIN  Take 2 capsules (200 mg total) by mouth at bedtime.   loratadine -pseudoephedrine  10-240 MG 24 hr tablet Commonly known as: CLARITIN -D 24-hour Take 1 tablet by mouth daily.   multivitamin with minerals Tabs tablet Take 1 tablet by mouth daily.   topiramate  25 MG tablet Commonly known as: Topamax  Take 2 tablets (50 mg total) by mouth at bedtime.   vitamin B-12 500 MCG tablet Commonly known as: CYANOCOBALAMIN  Take 1 tablet (500 mcg total) by mouth daily.   Vitamin D  (Ergocalciferol ) 1.25 MG (50000 UNIT) Caps capsule Commonly known as: DRISDOL  Take 1 capsule (50,000 Units total) by mouth every 7 (seven) days.   Wegovy  0.25 MG/0.5ML Soaj Generic drug: Semaglutide -Weight Management Inject 0.25 mg into the skin once a week.        All past medical history, surgical history, allergies, family history, immunizations andmedications were updated in the EMR today and reviewed under the history and medication portions of their EMR.      ROS 14 pt review of systems performed and negative (unless mentioned in an HPI)  Objective BP 110/70   Pulse 89   Wt 189 lb 3.2 oz (85.8 kg)   SpO2 97%   BMI 30.54 kg/m  Physical Exam Vitals and nursing note reviewed.  Constitutional:       General: She is not in acute distress.    Appearance: Normal appearance. She is normal weight. She is not ill-appearing or toxic-appearing.  HENT:     Head: Normocephalic and atraumatic.  Eyes:     General: No scleral icterus.       Right eye: No discharge.        Left eye: No discharge.     Extraocular Movements: Extraocular movements intact.     Conjunctiva/sclera: Conjunctivae normal.     Pupils: Pupils are equal, round, and reactive to light.  Skin:    Findings: No rash.  Neurological:     Mental Status: She is alert and oriented to person, place, and time. Mental status is at baseline.     Motor: No weakness.     Coordination: Coordination normal.     Gait: Gait normal.  Psychiatric:        Mood and Affect: Mood normal.        Behavior: Behavior normal.        Thought Content: Thought content normal.        Judgment: Judgment normal.     No results found.  Assessment/plan: Sherri Cobb is a 43 y.o. female present for  ADHD: We discussed today referral to attention specialist for formal reeval to see if she would meet criteria for trial of a stimulant.  Need to be cautious as she does have a lot of stress and anxiety, and stimulants can increase her anxiety. Continue Wellbutrin  200 (SR) BID. Does not need to follow up here for this concern unless she is no longer seeing PA prescribing or not working well enough for her.  Referral to [place] attention specialist placed for formal ADHD evaluation> did not receive a call from them, but she elects to wait for now.   Sciatica Continue  follow up w/ [facility] Declined PT  Return in about 5 months (around 10/17/2023) for cpe (20 min).   No orders of the defined types were placed in this encounter.   No orders of the defined types were placed in this encounter.   Referral Orders  No referral(s) requested today      Electronically signed by: Napolean Backbone, DO [facility]

## 2023-05-10 NOTE — Patient Instructions (Signed)
 Return in about 5 months (around 10/17/2023) for cpe (20 min).        Great to see you today.  I have refilled the medication(s) we provide.   If labs were collected or images ordered, we will inform you of  results once we have received them and reviewed. We will contact you either by echart message, or telephone call.  Please give ample time to the testing facility, and our office to run,  receive and review results. Please do not call inquiring of results, even if you can see them in your chart. We will contact you as soon as we are able. If it has been over 1 week since the test was completed, and you have not yet heard from us , then please call us .    - echart message- for normal results that have been seen by the patient already.   - telephone call: abnormal results or if patient has not viewed results in their echart.  If a referral to a specialist was entered for you, please call us  in 2 weeks if you have not heard from the specialist office to schedule.

## 2023-05-24 NOTE — Progress Notes (Unsigned)
 [facility] [address] [place] [address] Phone: [phone] Subjective:   Sherri Cobb, am serving as Cobb scribe for Dr. Ronnell Coins.  I'm seeing this patient by the request  of:  Kuneff, Renee A, DO  CC: Back and neck pain follow-up  [id number]  [name] is Cobb 43 y.o. female coming in with complaint of back and neck pain. OMT on 03/24/2023. Patient states that she has been doing ok. In past week she did have one night when her pain increased. Used Cobb heating pad to fall asleep. Sciatic nerve pain side continues to bother her on L side. Trying to not rely on gabapentin . Uses it prn.   Medications patient has been prescribed:   Taking:         Reviewed prior external information including notes and imaging from previsou exam, outside providers and external EMR if available.   As well as notes that were available from care everywhere and other healthcare systems.  Past medical history, social, surgical and family history all reviewed in electronic medical record.  No pertanent information unless stated regarding to the chief complaint.   Past Medical History:  Diagnosis Date   Allergies    Allergy    Back pain    Chicken pox    Depression 02/04/2022   Fatty liver    Gallbladder problem    Hx of vaginitis 12/10/2017   Recurrent yeast with Mirena IUD Reported history of uterine infection, but unable to find that in her Care Everywhere chart.   Insulin  resistance 12/09/2021   Lactose intolerance    Menometrorrhagia    Before BCPs   Migraines    Psoriasis     No Known Allergies   Review of Systems:  No headache, visual changes, nausea, vomiting, diarrhea, constipation, dizziness, abdominal pain, skin rash, fevers, chills, night sweats, weight loss, swollen lymph nodes, body aches, joint swelling, chest pain, shortness of breath, mood changes. POSITIVE muscle aches  Objective  Blood pressure 120/88, pulse 77, height  5\' 6"  (1.676 m), weight 191 lb (86.6 kg), SpO2 97%.   General: No apparent distress alert and oriented x3 mood and affect normal, dressed appropriately.  HEENT: Pupils equal, extraocular movements intact  Respiratory: Patient's speak in full sentences and does not appear short of breath  Cardiovascular: No lower extremity edema, non tender, no erythema  Gait normal  MSK:  Back does have some loss lordosis noted.  Some tenderness to palpation in the paraspinal musculature.  Positive straight leg test noted.  Weakness with dorsi flexion at 3 out of 5 strength as well as 1+ DTR of the Achilles on the left compared to the right.       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  Now having some weakness of the lower extremity. Patient has been trying to do well with this for 2 years but unfortunately continuing to worsen and now affecting daily activities.  Unable to be active.  Do feel advanced imaging with MRI of the lumbar and pelvis without contrast would be beneficial to further evaluate the sacroiliac joint injections have some sclerosis changes already noted.  Transfer center did not feel of injections.  Follow-up with me after imaging to discuss further.      The above documentation has been reviewed and is accurate and complete Isidro Margo, DO          Note: This dictation  was prepared with Dragon dictation along with smaller phrase technology. Any transcriptional errors that result from this process are unintentional.

## 2023-05-25 ENCOUNTER — Other Ambulatory Visit (HOSPITAL_COMMUNITY): Payer: Self-pay

## 2023-05-25 ENCOUNTER — Encounter (INDEPENDENT_AMBULATORY_CARE_PROVIDER_SITE_OTHER): Payer: Self-pay | Admitting: Physician Assistant

## 2023-05-25 ENCOUNTER — Ambulatory Visit: Admitting: Family Medicine

## 2023-05-25 ENCOUNTER — Other Ambulatory Visit: Payer: Self-pay

## 2023-05-25 ENCOUNTER — Ambulatory Visit (INDEPENDENT_AMBULATORY_CARE_PROVIDER_SITE_OTHER)

## 2023-05-25 ENCOUNTER — Ambulatory Visit (INDEPENDENT_AMBULATORY_CARE_PROVIDER_SITE_OTHER): Admitting: Physician Assistant

## 2023-05-25 VITALS — BP 120/88 | HR 77 | Ht 66.0 in | Wt 191.0 lb

## 2023-05-25 VITALS — BP 108/67 | HR 80 | Temp 98.9°F | Ht 66.0 in | Wt 187.0 lb

## 2023-05-25 DIAGNOSIS — E559 Vitamin D deficiency, unspecified: Secondary | ICD-10-CM

## 2023-05-25 DIAGNOSIS — F3289 Other specified depressive episodes: Secondary | ICD-10-CM

## 2023-05-25 DIAGNOSIS — M5416 Radiculopathy, lumbar region: Secondary | ICD-10-CM

## 2023-05-25 DIAGNOSIS — Z6830 Body mass index (BMI) 30.0-30.9, adult: Secondary | ICD-10-CM

## 2023-05-25 DIAGNOSIS — E669 Obesity, unspecified: Secondary | ICD-10-CM

## 2023-05-25 DIAGNOSIS — M545 Low back pain, unspecified: Secondary | ICD-10-CM

## 2023-05-25 DIAGNOSIS — E88819 Insulin resistance, unspecified: Secondary | ICD-10-CM | POA: Diagnosis not present

## 2023-05-25 DIAGNOSIS — E538 Deficiency of other specified B group vitamins: Secondary | ICD-10-CM

## 2023-05-25 MED ORDER — WEGOVY 0.5 MG/0.5ML ~~LOC~~ SOAJ
0.5000 mg | SUBCUTANEOUS | 0 refills | Status: AC
Start: 2023-05-25 — End: ?
  Filled 2023-05-25: qty 2, 28d supply, fill #0

## 2023-05-25 MED ORDER — VITAMIN D (ERGOCALCIFEROL) 1.25 MG (50000 UNIT) PO CAPS
50000.0000 [IU] | ORAL_CAPSULE | ORAL | 0 refills | Status: DC
  Filled 2023-05-25: qty 5, 35d supply, fill #0

## 2023-05-25 MED ORDER — BUPROPION HCL ER (SR) 200 MG PO TB12
200.0000 mg | ORAL_TABLET | Freq: Two times a day (BID) | ORAL | 1 refills | Status: DC
  Filled 2023-05-25 – 2023-05-30 (×2): qty 180, 90d supply, fill #0

## 2023-05-25 NOTE — Progress Notes (Signed)
 SUBJECTIVE: Discussed the use of AI scribe software for clinical note transcription with the patient, who gave verbal consent to proceed.  Chief Complaint: Obesity  Interim History: She is up 5 lbs since last visit. Muscle mass + ~ 2 lbs Adipose mass + 3.2 lbs Total body water + 1.4 lbs  Sherri Cobb is here to discuss her progress with her obesity treatment plan. She is on the keeping a food journal and adhering to recommended goals of 1250 calories and 80+ grams of  protein and states she is following her eating plan approximately 65 % of the time. She states she is exercising walking/pilates/yoga/ HIIT work out 30 minutes 3-4 times per week.  Sherri Cobb is a 43 year old female who presents for follow-up of her obesity treatment plan.  She is currently in the loading dose period of Wegovy  for medical weight loss, which helps reduce her appetite and allows her to leave food on her plate. She experiences minor constipation but has not needed laxatives or fiber supplements.  She is taking calcifediol  50,000 units once weekly for vitamin D  deficiency and vitamin B12 500 mcg daily for low serum B12. She is on bupropion  SR 200 mg twice a day to help with emotional eating and cravings, although she has been inconsistent with taking it recently. She has stopped taking topiramate  and is not using any allergy medications.   She mentions having a lot of energy today and plans to maintain this throughout the day. She is planning a trip to the beach, where she intends to stay active and make healthy food choices.  No nausea, diarrhea, difficulty swallowing, or sensation of a lump in her throat. She notes a tendency to crave carbohydrates, especially in the mornings, but is trying to manage this by focusing on protein intake. Slight issue with constipation. Has upcoming trip to [place] but does not feel will be a problem to stay on track with her plan.   OBJECTIVE: Visit Diagnoses: Problem List  Items Addressed This Visit     Generalized obesity   Relevant Medications   Semaglutide -Weight Management (WEGOVY ) 0.5 MG/0.5ML SOAJ   Vitamin D  insufficiency   Relevant Medications   Vitamin D , Ergocalciferol , (DRISDOL ) 1.25 MG (50000 UNIT) CAPS capsule   B12 deficiency   Depression   Relevant Medications   buPROPion  (WELLBUTRIN  SR) 200 MG 12 hr tablet   Other Visit Diagnoses       Insulin  resistance    -  Primary   Relevant Medications   Semaglutide -Weight Management (WEGOVY ) 0.5 MG/0.5ML SOAJ     BMI 30.0-30.9,adult Current BMI 30.2         Obesity Currently in the loading dose period of Wegovy  for medical weight loss. Reports increased muscle mass and appetite suppression with the current dose. Mild constipation noted, not requiring treatment. Discussed balancing appetite suppression to avoid meal skipping and metabolism reduction.  Discussed potential initial increase in sweet cravings and mindful eating to manage them. - Increase Wegovy  to 0.5 mg weekly and monitor response. - Consider increasing Wegovy  to 1 mg if 0.5 mg is insufficient. - Encourage hydration to manage potential constipation and nausea. - Encourage mindful eating to manage cravings, especially for sweets.   Insulin  Resistance Last fasting insulin  was 6.5- nearly at goal. A1c was 5.1- at goal. Polyphagia:Yes Medication(s): Wegovy  0.25 mg SQ weekly Denies mass in neck, dysphagia, dyspepsia, persistent hoarseness, abdominal pain, or N/V/Constipation or diarrhea. Has annual eye exam. Mood is stable.  Mild constipation  and will increase fluids/H2O/Fiber as needed.  Lab Results  Component Value Date   HGBA1C 5.1 03/22/2023   HGBA1C 5.2 04/01/2022   HGBA1C 5.0 12/09/2021   HGBA1C 5.1 06/03/2021   HGBA1C 5.1 11/13/2020   Lab Results  Component Value Date   INSULIN  6.5 03/22/2023   INSULIN  10.9 04/01/2022   INSULIN  11.4 12/09/2021   INSULIN  10.9 06/03/2021   INSULIN  13.2 11/13/2020    Plan: Continue  and increase dose Wegovy  0.50 mg SQ weekly Continue working on nutrition plan to decrease simple carbohydrates, increase lean proteins and exercise to promote weight loss, improve glycemic control and prevent progression to Type 2 diabetes.  Recheck labs in July  Meds ordered this encounter  Medications   Vitamin D , Ergocalciferol , (DRISDOL ) 1.25 MG (50000 UNIT) CAPS capsule    Sig: Take 1 capsule (50,000 Units total) by mouth every 7 (seven) days.    Dispense:  5 capsule    Refill:  0   Semaglutide -Weight Management (WEGOVY ) 0.5 MG/0.5ML SOAJ    Sig: Inject 0.5 mg into the skin once a week.    Dispense:  2 mL    Refill:  0   buPROPion  (WELLBUTRIN  SR) 200 MG 12 hr tablet    Sig: Take 1 tablet (200 mg total) by mouth 2 (two) times daily.    Dispense:  180 tablet    Refill:  1    Emotional eating and cravings On bupropion  SR 200 mg twice daily for emotional eating and cravings. Reports inconsistent medication adherence but wishes to continue. Discussed benefits of bupropion  for energy and focus.No SE with bupropion .  - Encourage consistent use of bupropion  SR 200 mg twice daily. Refilled   Vitamin D  deficiency On Ergocalciferol  50,000 units once weekly. No new concerns reported.No N/V or muscle weakness with Ergocalciferol .  Last vitamin D  Lab Results  Component Value Date   VD25OH 40.0 03/22/2023   Plan: Continue Ergocalciferol  50,000 units once weekly and refill.  Low vitamin D  levels can be associated with adiposity and may result in leptin resistance and weight gain. Also associated with fatigue.  Currently on vitamin D  supplementation without any adverse effects such as nausea, vomiting or muscle weakness.  Recheck labs in July  Vitamin B12 deficiency On vitamin B12 500 mcg daily. No new concerns reported. Plan: Continue B 12 daily.  Recheck labs in July.   Vitals Temp: 98.9 F (37.2 C) BP: 108/67 Pulse Rate: 80 SpO2: 96 %   Anthropometric Measurements Height: 5'  6" (1.676 m) Weight: 187 lb (84.8 kg) BMI (Calculated): 30.2 Weight at Last Visit: 182 lb Weight Lost Since Last Visit: 0 Weight Gained Since Last Visit: 5 lb Starting Weight: 188 lb Total Weight Loss (lbs): 1 lb (0.454 kg) Peak Weight: 195 lb   Body Composition  Body Fat %: 35.5 % Fat Mass (lbs): 66.4 lbs Muscle Mass (lbs): 114.6 lbs Total Body Water (lbs): 76.8 lbs Visceral Fat Rating : 7   Other Clinical Data Fasting: No Labs: No Today's Visit #: 28 Starting Date: 11/12/20     ASSESSMENT AND PLAN:  Diet: [place] is currently in the action stage of change. As such, her goal is to continue with weight loss efforts. She has agreed to keeping a food journal and adhering to recommended goals of 1250 calories and 80+ grams of protein.  Exercise: Tanaka has been instructed to continue exercising as is for weight loss and overall health benefits.   Behavior Modification:  We discussed the following Behavioral  Modification Strategies today: increasing lean protein intake, decreasing simple carbohydrates, increasing vegetables, increase H2O intake, increase high fiber foods, no skipping meals, emotional eating strategies , avoiding temptations, planning for success, and keep a strict food journal. We discussed various medication options to help Iria with her weight loss efforts and we both agreed to increase Wegovy  to 0.5 mg weekly for medical weight loss and continue to work on nutritional and behavioral strategies to promote weight loss.  .  Return in about 4 weeks (around 06/22/2023).Aaron Aas She was informed of the importance of frequent follow up visits to maximize her success with intensive lifestyle modifications for her multiple health conditions.  Attestation Statements:   Reviewed by clinician on day of visit: allergies, medications, problem list, medical history, surgical history, family history, social history, and previous encounter notes.   Time spent on visit  including pre-visit chart review and post-visit care and charting was 29 minutes.    Bryn Saline, PA-C

## 2023-05-25 NOTE — Assessment & Plan Note (Signed)
 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  Now having some weakness of the lower extremity. Patient has been trying to do well with this for 2 years but unfortunately continuing to worsen and now affecting daily activities.  Unable to be active.  Do feel advanced imaging with MRI of the lumbar and pelvis without contrast would be beneficial to further evaluate the sacroiliac joint injections have some sclerosis changes already noted.  Transfer center did not feel of injections.  Follow-up with me after imaging to discuss further.

## 2023-05-25 NOTE — Patient Instructions (Addendum)
 [facility] [phone] Call Today  When we receive your results we will contact you.   Xrays today See you again in 2 months just in case

## 2023-05-30 ENCOUNTER — Other Ambulatory Visit (HOSPITAL_COMMUNITY): Payer: Self-pay

## 2023-05-30 ENCOUNTER — Other Ambulatory Visit: Payer: Self-pay

## 2023-06-08 ENCOUNTER — Ambulatory Visit
Admission: RE | Admit: 2023-06-08 | Discharge: 2023-06-08 | Disposition: A | Discharge status: Home / Self Care | Source: Ambulatory Visit | Attending: Family Medicine | Admitting: Family Medicine

## 2023-06-08 DIAGNOSIS — M545 Low back pain, unspecified: Secondary | ICD-10-CM

## 2023-06-13 ENCOUNTER — Ambulatory Visit: Payer: Self-pay | Admitting: Family Medicine

## 2023-06-28 ENCOUNTER — Ambulatory Visit (INDEPENDENT_AMBULATORY_CARE_PROVIDER_SITE_OTHER): Admitting: Family Medicine

## 2023-06-28 NOTE — Progress Notes (Signed)
 SUBJECTIVE: Discussed the use of AI scribe software for clinical note transcription with the patient, who gave verbal consent to proceed.  Chief Complaint: Obesity  Interim History: She is down 1 lb since last visit Muscle mass + 9.6 lbs Adipose mass -10.4 lbs   Sherri Cobb is here to discuss her progress with her obesity treatment plan. She is on the keeping a food journal and adhering to recommended goals of 1250 calories and 80 protein and states she is following her eating plan approximately 85 % of the time. She states she is exercising 30 minutes 3 times per week.  Sherri Cobb is a 43 year old female with obesity, insulin  resistance, and hyperlipidemia who presents for follow-up of her obesity treatment plan.  She is currently on bupropion  200 mg twice daily and Wegovy  0.5 mg once weekly for her obesity, insulin  resistance, and hyperlipidemia. Her clothes do not feel tight, but she has not noticed a significant change in how they fit. She experiences fatigue, which she attributes to a decrease in appetite and energy to work out. Occasional constipation is managed by increasing fiber intake.  She has a history of vitamin D  deficiency and is taking ergocalciferol  50,000 units once weekly. No issues with vitamin D  supplementation and she mentions getting some vitamin D  from sun exposure.  No side effects such as nausea or vomiting from her current medications, although she experienced stomach upset after consuming alcohol at a social event, which she attributes to her lack of a gallbladder.  She is sleeping well and is making efforts to stay hydrated, especially given the current hot weather conditions.  She travels to [place] once a week for work and has children who are currently out of school for the summer. Her youngest child is visiting her father for a week, providing her with a brief respite.  OBJECTIVE: Visit Diagnoses: Problem List Items Addressed This Visit      Generalized obesity   Relevant Medications   Semaglutide -Weight Management (WEGOVY ) 0.5 MG/0.5ML SOAJ   Vitamin D  insufficiency   B12 deficiency   Other Visit Diagnoses       Polyphagia    -  Primary     Other hyperlipidemia         Insulin  resistance       Relevant Medications   Semaglutide -Weight Management (WEGOVY ) 0.5 MG/0.5ML SOAJ     BMI 30.0-30.9,adult Current BMI 30.1         Obesity with polyphagai.  Treated with Wegovy  (semaglutide ) 0.5 mg once weekly, resulting in a positive shift in body composition: increased muscle mass by nearly ten pounds and decreased adipose mass by nearly ten pounds, with adipose percentage now at 30%. Reports no significant side effects except constipation, managed with increased fiber intake, decreased appetite, and some fatigue. Current dosage is effective, balancing weight management and side effects.Polyphagia well controlled on Wegovy .  - Continue Wegovy  0.5 mg once weekly - Encourage increased fiber intake to manage constipation - Advise maintaining hydration, especially in hot weather - Monitor weight and body composition at next visit  Insulin  Resistance Last fasting insulin  was 6.5- just above goal of 5. A1c was 5.1- at goal. Polyphagia:No Medication(s): Wegovy  0.50 mg SQ weekly Denies mass in neck, dysphagia, dyspepsia, persistent hoarseness, abdominal pain, or N/V/Constipation or diarrhea. Has annual eye exam. Mood is stable.  Some fatigue at times and will need to monitor Lab Results  Component Value Date   HGBA1C 5.1 03/22/2023   HGBA1C 5.2 04/01/2022  HGBA1C 5.0 12/09/2021   HGBA1C 5.1 06/03/2021   HGBA1C 5.1 11/13/2020   Lab Results  Component Value Date   INSULIN  6.5 03/22/2023   INSULIN  10.9 04/01/2022   INSULIN  11.4 12/09/2021   INSULIN  10.9 06/03/2021   INSULIN  13.2 11/13/2020    Plan: Continue and refill Wegovy  0.50 mg SQ weekly Continue working on nutrition plan to decrease simple carbohydrates, increase lean  proteins and exercise to promote weight loss, improve glycemic control and prevent progression to Type 2 diabetes.   Hyperlipidemia LDL is not at goal. HDL at goal. Trig at goal.  Medication(s): Wegovy  0.5 mg weekly Cardiovascular risk factors: dyslipidemia  Lab Results  Component Value Date   CHOL 203 (H) 03/22/2023   HDL 60 03/22/2023   LDLCALC 122 (H) 03/22/2023   TRIG 117 03/22/2023   Lab Results  Component Value Date   ALT 12 03/22/2023   AST 14 03/22/2023   ALKPHOS 42 (L) 03/22/2023   BILITOT 0.5 03/22/2023   The 10-year ASCVD risk score (Arnett DK, et al., 2019) is: 0.3%   Values used to calculate the score:     Age: 44 years     Clincally relevant sex: Female     Is Non-Hispanic African American: No     Diabetic: No     Tobacco smoker: No     Systolic Blood Pressure: 105 mmHg     Is BP treated: No     HDL Cholesterol: 60 mg/dL     Total Cholesterol: 203 mg/dL  Plan: Continue Wegovy  Continue to work on nutrition plan -decreasing simple carbohydrates, increasing lean proteins, decreasing saturated fats and cholesterol , avoiding trans fats and exercise as able to promote weight loss, improve lipids and decrease cardiovascular risks. Recheck lipids in 2-3 months.    Vitamin D  Deficiency Treated with ergocalciferol  50,000 units once weekly, with no issues reported. Additional sun exposure may aid in maintaining vitamin D  levels. No N/V or muscle weakness with Ergocalciferol . Endorses fatigue at times. ? Related to Wegovy .  Last vitamin D  Lab Results  Component Value Date   VD25OH 40.0 03/22/2023   Low vitamin D  levels can be associated with adiposity and may result in leptin resistance and weight gain. Also associated with fatigue.  Currently on vitamin D  supplementation without any adverse effects such as nausea, vomiting or muscle weakness.  - Continue ergocalciferol  50,000 units once weekly Reports no refills needed this visit. Monitor fatigue.  Recheck levels  in 2-3 months.   Low B 12 Endorses fatigue at times. On B 12 500 mcg daily. ? Related to wegovy .  Lab Results  Component Value Date   VITAMINB12 368 03/22/2023   Plan: Continue B 12 500 mcg daily. Monitor fatigue.  Recheck levels in 2-3 months.  Vitals Temp: 98.2 F (36.8 C) BP: 105/71 Pulse Rate: 82 SpO2: 98 %   Anthropometric Measurements Height: 5' 6 (1.676 m) Weight: 186 lb (84.4 kg) BMI (Calculated): 30.04 Weight at Last Visit: 187 lb Weight Lost Since Last Visit: 1 lb Weight Gained Since Last Visit: 0 Starting Weight: 188 lb Total Weight Loss (lbs): 2 lb (0.907 kg) Peak Weight: 195 lb   Body Composition  Body Fat %: 30 % Fat Mass (lbs): 56 lbs Muscle Mass (lbs): 124.2 lbs Total Body Water (lbs): 78.6 lbs Visceral Fat Rating : 6   Other Clinical Data Fasting: No Labs: No Today's Visit #: 29 Starting Date: 11/12/20     ASSESSMENT AND PLAN:  Diet: Rahel is currently  in the action stage of change. As such, her goal is to continue with weight loss efforts. She has agreed to keeping a food journal and adhering to recommended goals of 1250 calories and 80+ grams of protein.  Exercise: Noemie has been instructed to continue exercising as is for weight loss and overall health benefits.   Behavior Modification:  We discussed the following Behavioral Modification Strategies today: increasing lean protein intake, decreasing simple carbohydrates, increasing vegetables, increase H2O intake, increase high fiber foods, no skipping meals, meal planning and cooking strategies, emotional eating strategies , avoiding temptations, and planning for success. We discussed various medication options to help Aimie with her weight loss efforts and we both agreed to continue current treatment plan, continue to work on nutritional and behavioral strategies to promote weight loss.  .  Return in about 4 weeks (around 07/27/2023).Aaron Aas She was informed of the importance of  frequent follow up visits to maximize her success with intensive lifestyle modifications for her multiple health conditions.  Attestation Statements:   Reviewed by clinician on day of visit: allergies, medications, problem list, medical history, surgical history, family history, social history, and previous encounter notes.   Time spent on visit including pre-visit chart review and post-visit care and charting was 22 minutes.    Gorje Iyer, PA-C

## 2023-06-29 ENCOUNTER — Other Ambulatory Visit (HOSPITAL_COMMUNITY): Payer: Self-pay

## 2023-06-29 ENCOUNTER — Other Ambulatory Visit: Payer: Self-pay

## 2023-06-29 ENCOUNTER — Encounter (INDEPENDENT_AMBULATORY_CARE_PROVIDER_SITE_OTHER): Payer: Self-pay | Admitting: Physician Assistant

## 2023-06-29 ENCOUNTER — Ambulatory Visit (INDEPENDENT_AMBULATORY_CARE_PROVIDER_SITE_OTHER): Admitting: Physician Assistant

## 2023-06-29 VITALS — BP 105/71 | HR 82 | Temp 98.2°F | Ht 66.0 in | Wt 186.0 lb

## 2023-06-29 DIAGNOSIS — E538 Deficiency of other specified B group vitamins: Secondary | ICD-10-CM

## 2023-06-29 DIAGNOSIS — E669 Obesity, unspecified: Secondary | ICD-10-CM

## 2023-06-29 DIAGNOSIS — E559 Vitamin D deficiency, unspecified: Secondary | ICD-10-CM | POA: Diagnosis not present

## 2023-06-29 DIAGNOSIS — E7849 Other hyperlipidemia: Secondary | ICD-10-CM | POA: Diagnosis not present

## 2023-06-29 DIAGNOSIS — Z6830 Body mass index (BMI) 30.0-30.9, adult: Secondary | ICD-10-CM

## 2023-06-29 DIAGNOSIS — R632 Polyphagia: Secondary | ICD-10-CM | POA: Diagnosis not present

## 2023-06-29 DIAGNOSIS — E88819 Insulin resistance, unspecified: Secondary | ICD-10-CM

## 2023-06-29 MED ORDER — WEGOVY 0.5 MG/0.5ML ~~LOC~~ SOAJ
0.5000 mg | SUBCUTANEOUS | 0 refills | Status: DC
  Filled 2023-06-29: qty 2, 28d supply, fill #0

## 2023-07-26 NOTE — Progress Notes (Unsigned)
 SUBJECTIVE: Discussed the use of AI scribe software for clinical note transcription with the patient, who gave verbal consent to proceed.  Chief Complaint: Obesity  Interim History: She is up 1 lb since last visit.   Sherri Cobb is here to discuss her progress with her obesity treatment plan. She is on the Journaling plan 1250 calories 80 protein Daily and states she is following her eating plan approximately 65-70 % of the time. She states she is exercising 30 minutes 2 times per week.  Sherri Cobb is a 43 year old female with obesity who presents for follow-up of her obesity treatment plan.  She has a history of obesity, hyperlipidemia, insulin  resistance, vitamin D  and B12 deficiencies, and metabolically associated fatty liver disease. Her current medications include Wegovy  0.5 mg weekly, bupropion  200 mg twice daily, vitamin B12 500 mg daily, and ergocalciferol  50,000 units weekly.  Over the past few weeks, she has not adhered to her diet and exercise regimen as well as she should. She has not been overeating but notes a decrease in protein intake and exercise due to being busy and stressed, compounded by sickness in the household.  She has been experiencing increased cravings for sugary foods,  and tries to manage these cravings by consuming protein-rich foods like 'Oui yogurts' and whole fruit smoothies with protein.  She is concerned about losing Medicaid coverage as of August 1st due to income limits, which may affect her ability to afford Wegovy . She is exploring insurance options through [employer] and her job, seeking coverage for her weight loss medication. She is a single mother of two children and earns $3,000 a month. Her children are currently at home, which impacts her ability to exercise regularly.  Fasting labs were obtained today.  The patient was informed we would discuss the lab results at the next visit unless there is a critical issue that needs to be addressed  sooner. The patient agreed to keep the next visit at the agreed upon time to discuss these results.   OBJECTIVE: Visit Diagnoses: Problem List Items Addressed This Visit     Generalized obesity   Relevant Medications   Semaglutide -Weight Management 1 MG/0.5ML SOAJ   Vitamin D  insufficiency   Relevant Orders   VITAMIN D  25 Hydroxy (Vit-D Deficiency, Fractures)   B12 deficiency   Relevant Orders   Vitamin B12   CBC with Differential/Platelet   Depression   Fatty liver disease, nonalcoholic   Relevant Medications   Semaglutide -Weight Management 1 MG/0.5ML SOAJ   Other Visit Diagnoses       Polyphagia    -  Primary   Relevant Medications   Semaglutide -Weight Management 1 MG/0.5ML SOAJ     Insulin  resistance       Relevant Medications   Semaglutide -Weight Management 1 MG/0.5ML SOAJ   Other Relevant Orders   CMP14+EGFR   Hemoglobin A1c   Insulin , random     Other hyperlipidemia       Relevant Medications   Semaglutide -Weight Management 1 MG/0.5ML SOAJ   Other Relevant Orders   Lipid Panel With LDL/HDL Ratio     BMI 30.0-30.9,adult Current BMI 30.2         Obesity with polyphagia She is on Wegovy  0.5 mg weekly for obesity management. Despite stress and a busy schedule, she has maintained her weight and body composition, with a slight increase in muscle mass from the visit on 05/25/23 . Her body fat percentage is 35%, which is slightly higher than desired.  She has had more stress over the past month and polyphagia fair to moderately controlled currently.  She reports cravings for sugary foods, managed by consuming protein-rich foods and smoothies.  We are increasing the Wegovy  dose to better manage weight and cravings.  Insurance coverage for Wegovy  may be an issue in the near future.   She is exploring insurance options for continued medication coverage. - Increase Wegovy  dosage as discussed to 1 mg weekly- consider spacing dosing to every 10 to 14 days. . - Continue dietary  strategies, including protein-rich foods and smoothies. - Explore insurance options for Wegovy  coverage. - Discuss medication coverage policies with insurance providers.  Insulin  Resistance Last fasting insulin  was 6.5- nearing goal. A1c was 5.1- at goal. Polyphagia:Yes- notes some increased cravings for sugar.  Medication(s): Wegovy  0.50 mg SQ weekly Denies mass in neck, dysphagia, dyspepsia, persistent hoarseness, abdominal pain, or N/V/Constipation or diarrhea. Has annual eye exam. Mood is stable.   Lab Results  Component Value Date   HGBA1C 5.1 03/22/2023   HGBA1C 5.2 04/01/2022   HGBA1C 5.0 12/09/2021   HGBA1C 5.1 06/03/2021   HGBA1C 5.1 11/13/2020   Lab Results  Component Value Date   INSULIN  6.5 03/22/2023   INSULIN  10.9 04/01/2022   INSULIN  11.4 12/09/2021   INSULIN  10.9 06/03/2021   INSULIN  13.2 11/13/2020    Plan: Continue and increase dose Wegovy  1.0 mg SQ weekly Continue working on nutrition plan to decrease simple carbohydrates, increase lean proteins and exercise to promote weight loss, improve glycemic control and prevent progression to Type 2 diabetes.    Metabolically Associated Fatty Liver Disease (MAFLD) MAFLD is managed as part of her overall obesity and metabolic health plan.   Intensive lifestyle modifications are the first line treatment for this issue.  We discussed several lifestyle modifications today and she will continue to work on diet, exercise and weight loss efforts.  Plan: Recheck LFT with other labs today.  The mainstay of treatment of NAFLD includes lifestyle modification to achieve weight loss, at least 7% of current body weight. Low carbohydrate diets can be beneficial in improving NAFLD liver histology. Additionally, exercise, even the absence of weight loss can have beneficial effects on the patient's metabolic profile and liver health. We recommend that their metabolic comorbidities be aggressively managed, as patients with NAFLD are at  increased risk of coronary artery disease.   Hyperlipidemia Hyperlipidemia management may be affected by insurance coverage for Wegovy , which may not be available in near future.  She is exploring insurance options for continued medication coverage. Lab Results  Component Value Date   CHOL 203 (H) 03/22/2023   CHOL 162 04/01/2022   CHOL 197 12/09/2021   Lab Results  Component Value Date   HDL 60 03/22/2023   HDL 45 04/01/2022   HDL 44 12/09/2021   Lab Results  Component Value Date   LDLCALC 122 (H) 03/22/2023   LDLCALC 104 (H) 04/01/2022   LDLCALC 129 (H) 12/09/2021   Lab Results  Component Value Date   TRIG 117 03/22/2023   TRIG 68 04/01/2022   TRIG 136 12/09/2021   No results found for: CHOLHDL No results found for: LDLDIRECT  LDL remains above goal.  - Continue and increase Wegovy  to 1 mg weekly or as noted above.  Recheck fasting lipids today. Continue to work on nutrition plan -decreasing simple carbohydrates, increasing lean proteins, decreasing saturated fats and cholesterol , avoiding trans fats and exercise as able to promote weight loss, improve lipids and decrease  cardiovascular risks.   Vitamin D  and B12 Deficiencies She is on ergocalciferol  50,000 units weekly and vitamin B12 500 mg daily for deficiencies. No side effects with B 12 and she is not currently taking Ergocalciferol  regularly. Denies excessive fatigue and overall feels she has adequate energy.  Recheck levels today with other labs and adjust supplementation accordingly.  - may want to resume ergocalciferol  50,000 units weekly- labs today. - Continue vitamin B12 500 mg daily.   Eating disorder/emotional eating Sherri Cobb has had issues with stress/emotional eating. Currently this is moderately controlled. Overall mood is stable. Medication(s): Other: She had been on bupropion  SR 200 mg BID, but has not been taking recently  Plan:  Will monitor off bupropion  for now.  She continues on Wegovy   which has been helpful overall, but may loose coverage soon.  May want to resume bupropion  based on how she does over the next 1-2 months.  Continues to work on emotional eating strategies.   Follow-up She is fasting for labs today, which will be obtained at the end of the visit. - Obtain fasting labs at the end of the visit. Follow up in 4 weeks x 2.   Vitals Temp: 98.1 F (36.7 C) BP: 103/70 Pulse Rate: 77 SpO2: 95 %   Anthropometric Measurements Height: 5' 6 (1.676 m) Weight: 187 lb (84.8 kg) BMI (Calculated): 30.2 Weight at Last Visit: 186 lb Weight Lost Since Last Visit: 0 Weight Gained Since Last Visit: 1 lb Starting Weight: 188 lb Total Weight Loss (lbs): 1 lb (0.454 kg)   Body Composition  Body Fat %: 35.3 % Fat Mass (lbs): 66 lbs Muscle Mass (lbs): 115 lbs Total Body Water (lbs): 75.2 lbs Visceral Fat Rating : 7   Other Clinical Data Fasting: Yes Labs: Yes Today's Visit #: 30 Starting Date: 11/12/20     ASSESSMENT AND PLAN:  Diet: Sherri Cobb is currently in the action stage of change. As such, her goal is to continue with weight loss efforts. She has agreed to keeping a food journal and adhering to recommended goals of 1250 calories and 80+ grams of protein.  Exercise: Sherri Cobb has been instructed to work up to a goal of 150 minutes of combined cardio and strengthening exercise per week for weight loss and overall health benefits.   Behavior Modification:  We discussed the following Behavioral Modification Strategies today: increasing lean protein intake, decreasing simple carbohydrates, increasing vegetables, increase H2O intake, increase high fiber foods, no skipping meals, meal planning and cooking strategies, avoiding temptations, planning for success, and keep a strict food journal. We discussed various medication options to help Sherri Cobb with her weight loss efforts and we both agreed to increase Wegovy  to 1 mg weekly .  Return in about 4 weeks  (around 08/24/2023).Sherri Cobb She was informed of the importance of frequent follow up visits to maximize her success with intensive lifestyle modifications for her multiple health conditions.  Attestation Statements:   Reviewed by clinician on day of visit: allergies, medications, problem list, medical history, surgical history, family history, social history, and previous encounter notes.   Time spent on visit including pre-visit chart review and post-visit care and charting was 38 minutes.    Aleric Froelich, PA-C

## 2023-07-27 ENCOUNTER — Other Ambulatory Visit: Payer: Self-pay

## 2023-07-27 ENCOUNTER — Ambulatory Visit (INDEPENDENT_AMBULATORY_CARE_PROVIDER_SITE_OTHER): Admitting: Physician Assistant

## 2023-07-27 ENCOUNTER — Encounter (INDEPENDENT_AMBULATORY_CARE_PROVIDER_SITE_OTHER): Payer: Self-pay | Admitting: Physician Assistant

## 2023-07-27 ENCOUNTER — Other Ambulatory Visit (HOSPITAL_COMMUNITY): Payer: Self-pay

## 2023-07-27 VITALS — BP 103/70 | HR 77 | Temp 98.1°F | Ht 66.0 in | Wt 187.0 lb

## 2023-07-27 DIAGNOSIS — E538 Deficiency of other specified B group vitamins: Secondary | ICD-10-CM

## 2023-07-27 DIAGNOSIS — E88819 Insulin resistance, unspecified: Secondary | ICD-10-CM | POA: Diagnosis not present

## 2023-07-27 DIAGNOSIS — Z6830 Body mass index (BMI) 30.0-30.9, adult: Secondary | ICD-10-CM

## 2023-07-27 DIAGNOSIS — F3289 Other specified depressive episodes: Secondary | ICD-10-CM

## 2023-07-27 DIAGNOSIS — E559 Vitamin D deficiency, unspecified: Secondary | ICD-10-CM

## 2023-07-27 DIAGNOSIS — E7849 Other hyperlipidemia: Secondary | ICD-10-CM | POA: Diagnosis not present

## 2023-07-27 DIAGNOSIS — K760 Fatty (change of) liver, not elsewhere classified: Secondary | ICD-10-CM

## 2023-07-27 DIAGNOSIS — R632 Polyphagia: Secondary | ICD-10-CM | POA: Diagnosis not present

## 2023-07-27 DIAGNOSIS — F32A Depression, unspecified: Secondary | ICD-10-CM

## 2023-07-27 DIAGNOSIS — F5089 Other specified eating disorder: Secondary | ICD-10-CM

## 2023-07-27 DIAGNOSIS — E669 Obesity, unspecified: Secondary | ICD-10-CM

## 2023-07-27 MED ORDER — SEMAGLUTIDE-WEIGHT MANAGEMENT 1 MG/0.5ML ~~LOC~~ SOAJ
1.0000 mg | SUBCUTANEOUS | 0 refills | Status: DC
  Filled 2023-07-27 – 2023-07-29 (×2): qty 2, 28d supply, fill #0

## 2023-07-27 MED ORDER — WEGOVY 0.5 MG/0.5ML ~~LOC~~ SOAJ
0.5000 mg | SUBCUTANEOUS | 0 refills | Status: DC
  Filled 2023-07-27: qty 2, 28d supply, fill #0

## 2023-07-28 ENCOUNTER — Other Ambulatory Visit: Payer: Self-pay

## 2023-07-28 LAB — VITAMIN B12: Vitamin B-12: 380 pg/mL (ref 232–1245)

## 2023-07-28 LAB — CMP14+EGFR
ALT: 16 IU/L (ref 0–32)
AST: 14 IU/L (ref 0–40)
Albumin: 4.5 g/dL (ref 3.9–4.9)
Alkaline Phosphatase: 48 IU/L (ref 44–121)
BUN/Creatinine Ratio: 13 (ref 9–23)
BUN: 12 mg/dL (ref 6–24)
Bilirubin Total: 0.4 mg/dL (ref 0.0–1.2)
CO2: 21 mmol/L (ref 20–29)
Calcium: 9.6 mg/dL (ref 8.7–10.2)
Chloride: 101 mmol/L (ref 96–106)
Creatinine, Ser: 0.89 mg/dL (ref 0.57–1.00)
Globulin, Total: 2.9 g/dL (ref 1.5–4.5)
Glucose: 88 mg/dL (ref 70–99)
Potassium: 4.5 mmol/L (ref 3.5–5.2)
Sodium: 140 mmol/L (ref 134–144)
Total Protein: 7.4 g/dL (ref 6.0–8.5)
eGFR: 84 mL/min/1.73 (ref >59)

## 2023-07-28 LAB — CBC WITH DIFFERENTIAL/PLATELET
Basophils Absolute: 0 x10E3/uL (ref 0.0–0.2)
Basos: 1 %
EOS (ABSOLUTE): 0.1 x10E3/uL (ref 0.0–0.4)
Eos: 1 %
Hematocrit: 42 % (ref 34.0–46.6)
Hemoglobin: 13.8 g/dL (ref 11.1–15.9)
Immature Grans (Abs): 0 x10E3/uL (ref 0.0–0.1)
Immature Granulocytes: 0 %
Lymphocytes Absolute: 2.2 x10E3/uL (ref 0.7–3.1)
Lymphs: 34 %
MCH: 29.8 pg (ref 26.6–33.0)
MCHC: 32.9 g/dL (ref 31.5–35.7)
MCV: 91 fL (ref 79–97)
Monocytes Absolute: 0.4 x10E3/uL (ref 0.1–0.9)
Monocytes: 6 %
Neutrophils Absolute: 3.7 x10E3/uL (ref 1.4–7.0)
Neutrophils: 58 %
Platelets: 227 x10E3/uL (ref 150–450)
RBC: 4.63 x10E6/uL (ref 3.77–5.28)
RDW: 12.3 % (ref 11.7–15.4)
WBC: 6.4 x10E3/uL (ref 3.4–10.8)

## 2023-07-28 LAB — LIPID PANEL WITH LDL/HDL RATIO
Cholesterol, Total: 228 mg/dL — ABNORMAL HIGH (ref 100–199)
HDL: 49 mg/dL (ref >39)
LDL Chol Calc (NIH): 155 mg/dL — ABNORMAL HIGH (ref 0–99)
LDL/HDL Ratio: 3.2 ratio (ref 0.0–3.2)
Triglycerides: 135 mg/dL (ref 0–149)
VLDL Cholesterol Cal: 24 mg/dL (ref 5–40)

## 2023-07-28 LAB — HEMOGLOBIN A1C
Est. average glucose Bld gHb Est-mCnc: 103 mg/dL
Hgb A1c MFr Bld: 5.2 % (ref 4.8–5.6)

## 2023-07-28 LAB — INSULIN, RANDOM: INSULIN: 18.1 u[IU]/mL (ref 2.6–24.9)

## 2023-07-28 LAB — VITAMIN D 25 HYDROXY (VIT D DEFICIENCY, FRACTURES): Vit D, 25-Hydroxy: 32.9 ng/mL (ref 30.0–100.0)

## 2023-07-29 ENCOUNTER — Other Ambulatory Visit (HOSPITAL_COMMUNITY): Payer: Self-pay

## 2023-08-02 NOTE — Progress Notes (Unsigned)
 Sherri Cobb [facility] [address] [place] [address] Phone: [phone] Subjective:   Sherri Cobb, am serving as a scribe for Dr. Arthea Cobb.  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  Now having some weakness of the lower extremity. Patient has been trying to do well with this for 2 years but unfortunately continuing to worsen and now affecting daily activities.  Unable to be active.  Do feel advanced imaging with MRI of the lumbar and pelvis without contrast would be beneficial to further evaluate the sacroiliac joint injections have some sclerosis changes already noted.  Transfer center did not feel of injections.  Follow-up with me after imaging to discuss further.     Updated 08/03/2023 Sherri Cobb is a 43 y.o. female coming in with complaint of low back pain.  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 There is a focal area of abnormal signal in the right pedicle which is indeterminate. This could reflect a small atypical hemangioma. If patient has significant mid low back pain further evaluation with bone scan could be performed. Repeat CT could also be performed to determine if there is any change in the appearance of the pedicle.  Otherwise, degenerative disc desiccation at L3-4 and L4-5 with slight crowdingof the descending nerve roots in the left lateral recess at L4-5 as above.   IMPRESSION: Minimal insertional tendinosis of the gluteus medius and minimus tendons.   Otherwise, unremarkable MRI of the pelvis.   Recent lab work on still shows that patient's B12 is somewhat low at 380.  Past Medical History:  Diagnosis Date   Allergies    Allergy    Back pain    Chicken pox    Depression 02/04/2022   Fatty liver    Gallbladder problem    Hx of vaginitis  12/10/2017   Recurrent yeast with Mirena IUD Reported history of uterine infection, but unable to find that in her Care Everywhere chart.   Insulin  resistance 12/09/2021   Lactose intolerance    Menometrorrhagia    Before BCPs   Migraines    Psoriasis    Past Surgical History:  Procedure Laterality Date   CHOLECYSTECTOMY N/A 07/12/2020   Procedure: LAPAROSCOPIC CHOLECYSTECTOMY;  Surgeon: Teresa Lonni HERO, MD;  Location: [facility];  Service: General;  Laterality: N/A;   KNEE ARTHROSCOPY WITH LATERAL RELEASE Right 12/27/2019   Procedure: KNEE ARTHROSCOPY WITH LATERAL RELEASE; REMOVAL LOOSE FOREIGN BODY; DEBRIDEMENT/SHAVING CHONDROPLASTY;  Surgeon: Cristy Bonner DASEN, MD;  Location: [facility];  Service: Orthopedics;  Laterality: Right;   LASIK Bilateral 2016   TIBIAL TUBERCLERPLASTY Right 12/27/2019   Procedure: LIGAMENT RECONSTRUCTION KNEE EXTRA-ARTICULAR, ANTERIOR TIBIAL TUBERCLEPLASTY, DECOMPRESSION FASCIOTOMY, LEG ANTERIOR;  Surgeon: Cristy Bonner DASEN, MD;  Location: [facility];  Service: Orthopedics;  Laterality: Right;   WISDOM TOOTH EXTRACTION     Social History   Socioeconomic History   Marital status: Domestic Partner    Spouse name: Belvie   Number of children: 2   Years of education: Not on file   Highest education level: Associate degree: occupational, Scientist, product/process development, or vocational program  Occupational History   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  Other Topics Concern   Not on file  Social History Narrative   Marital status/children/pets: Single/Div., 2 daughters    Education/employment: associates degree, administrative work   Field seismologist:      -smoke alarm in the home:Yes     - wears seatbelt: Yes     - Feels safe in their relationships: Yes      Social Drivers of Corporate investment banker Strain: Medium Risk (05/10/2023)   Overall Financial Resource Strain (CARDIA)    Difficulty of Paying Living Expenses: Somewhat hard  Food Insecurity: No Food Insecurity (05/10/2023)   Hunger Vital Sign    Worried About Running Out of Food in the Last Year: Never true    Ran Out of Food in the Last Year: Never true  Transportation Needs: No Transportation Needs (05/10/2023)   PRAPARE - Administrator, Civil Service (Medical): No    Lack of Transportation (Non-Medical): No  Physical Activity: Sufficiently Active (05/10/2023)   Exercise Vital Sign    Days of Exercise per Week: 5 days    Minutes of Exercise per Session: 30 min  Stress: No Stress Concern Present (05/10/2023)   [employer] of Occupational Health - Occupational Stress Questionnaire    Feeling of Stress : Only a little  Social Connections: Moderately Isolated (05/10/2023)   Social Connection and Isolation Panel    Frequency of Communication with Friends and Family: More than three times a week    Frequency of Social Gatherings with Friends and Family: Once a week    Attends Religious Services: Never    Database administrator or Organizations: Yes    Attends Engineer, structural: More than 4 times per year    Marital Status: Divorced   No Known Allergies Family History  Problem Relation Age of Onset   Hypertension Mother    Hyperlipidemia Mother    Anxiety disorder Mother    Sleep apnea Mother    Thyroid  disease Father    Depression Father    Gout Father    Kidney Stones Father    Miscarriages / Stillbirths Sister    Anxiety disorder Daughter    Depression Daughter    ADD / ADHD Daughter    Hypertension Maternal Grandmother    Depression Maternal Grandmother     Skin cancer Maternal Grandmother    Diverticulitis Maternal Grandmother    BRCA 1/2 Neg Hx    Breast cancer Neg Hx       Current Outpatient Medications (Respiratory):    fluticasone  (FLONASE ) 50 MCG/ACT nasal spray, Place 2 sprays into both nostrils daily.   loratadine -pseudoephedrine  (CLARITIN -D 24-HOUR) 10-240 MG 24 hr tablet, Take 1 tablet by mouth daily.   Current Outpatient Medications (Hematological):    vitamin B-12 (CYANOCOBALAMIN ) 500 MCG tablet, Take 1 tablet (500 mcg total) by mouth daily.  Current Outpatient Medications (Other):    buPROPion  (WELLBUTRIN  SR) 200 MG 12 hr tablet, Take 1 tablet (200 mg total) by mouth 2 (two) times daily. (Patient not taking: Reported on 07/27/2023)   fluocinonide  ointment (LIDEX ) 0.05 %, Apply 1 Application topically 2 (two) times daily.   gabapentin  (NEURONTIN ) 100 MG capsule, Take 2 capsules (200 mg total)  by mouth at bedtime.   Multiple Vitamin (MULTIVITAMIN WITH MINERALS) TABS tablet, Take 1 tablet by mouth daily.   Semaglutide -Weight Management 1 MG/0.5ML SOAJ, Inject 1 mg into the skin once a week.   Vitamin D , Ergocalciferol , (DRISDOL ) 1.25 MG (50000 UNIT) CAPS capsule, Take 1 capsule (50,000 Units total) by mouth every 7 (seven) days.   Reviewed prior external information including notes and imaging from  primary care provider As well as notes that were available from care everywhere and other healthcare systems.  Past medical history, social, surgical and family history all reviewed in electronic medical record.  No pertanent information unless stated regarding to the chief complaint.   Review of Systems:  No headache, visual changes, nausea, vomiting, diarrhea, constipation, dizziness, abdominal pain, skin rash, fevers, chills, night sweats, weight loss, swollen lymph nodes, body aches, joint swelling, chest pain, shortness of breath, mood changes. POSITIVE muscle aches  Objective  Blood pressure 110/82, pulse 88, height 5' 6  (1.676 m), weight 190 lb (86.2 kg), SpO2 98%.   General: No apparent distress alert and oriented x3 mood and affect normal, dressed appropriately.  HEENT: Pupils equal, extraocular movements intact  Respiratory: Patient's speak in full sentences and does not appear short of breath  Cardiovascular: No lower extremity edema, non tender, no erythema  Low back does have some loss lordosis.  Still tightness noted with straight leg test left greater than right with some radicular symptoms noted.    Impression and Recommendations:    The above documentation has been reviewed and is accurate and complete Sherri Willaims M Terris Bodin, DO

## 2023-08-03 ENCOUNTER — Ambulatory Visit: Admitting: Family Medicine

## 2023-08-03 VITALS — BP 110/82 | HR 88 | Ht 66.0 in | Wt 190.0 lb

## 2023-08-03 DIAGNOSIS — M5416 Radiculopathy, lumbar region: Secondary | ICD-10-CM | POA: Diagnosis not present

## 2023-08-03 DIAGNOSIS — M545 Low back pain, unspecified: Secondary | ICD-10-CM | POA: Diagnosis not present

## 2023-08-03 NOTE — Assessment & Plan Note (Signed)
 Discussed with patient at great length.  We discussed there is a possibility of a protruding disc causing some impingement.  Patient would like to consider the possibility of injection.  Will schedule an epidural in the near future.  I do think that this could help out significantly with some of the pain as well as help us  diagnose.  Increase activity slowly otherwise.  Follow-up with me again 6 to 8 weeks after the injection.

## 2023-08-03 NOTE — Patient Instructions (Addendum)
 [facility] [phone] Call Today Keep working on Core strength with more isometrics Hope you make it to the beach See you again in 2-3 months

## 2023-08-04 ENCOUNTER — Encounter: Payer: Self-pay | Admitting: Family Medicine

## 2023-08-09 ENCOUNTER — Ambulatory Visit: Admitting: Family Medicine

## 2023-08-09 ENCOUNTER — Encounter: Payer: Self-pay | Admitting: Family Medicine

## 2023-08-09 ENCOUNTER — Other Ambulatory Visit (HOSPITAL_COMMUNITY): Payer: Self-pay

## 2023-08-09 NOTE — Discharge Instructions (Signed)

## 2023-08-10 ENCOUNTER — Inpatient Hospital Stay
Admission: RE | Admit: 2023-08-10 | Discharge: 2023-08-10 | Disposition: A | Discharge status: Home / Self Care | Source: Ambulatory Visit | Attending: Family Medicine | Admitting: Family Medicine

## 2023-08-22 NOTE — Progress Notes (Signed)
 SUBJECTIVE: Discussed the use of AI scribe software for clinical note transcription with the patient, who gave verbal consent to proceed.  Chief Complaint: Obesity  Interim History: She is down 1 lb since her last visit   Sherri Cobb is here to discuss her progress with her obesity treatment plan. She is on the keeping a food journal and adhering to recommended goals of 1250 calories and 80+ grams of protein and states she is following her eating plan approximately 75-80 % of the time. She states she is exercising pilates 30 minutes 1-2 times per week. Sherri Cobb is a 43 year old female with obesity who presents for follow-up of her obesity treatment plan.  She has lost one pound since her last visit and is journaling 1250 calories daily, consuming 80 grams of protein 75-80% of the time. She is meeting her protein and hydration goals but occasionally skips meals and has not increased her intake of whole foods.  She experiences migraines, which have been particularly bad over the past month. She has not been consistent with workouts due to feeling unwell. Topiramate  previously helped with migraines by altering the taste of sugary drinks, but she stopped it when starting Wegovy .  She is currently on Wegovy , maintaining the same dosage despite an increase, and is awaiting insurance approval for continued use. She experiences constipation, which she manages with increased fiber and water intake. No new vision changes or mood alterations are noted.  She has a history of B12 and vitamin D  deficiency, prioritizing B12 supplementation but not taking vitamin D  recently. Her vitamin D  levels were low but not critically so.  She has a history of metabolically associated fatty liver disease, identified during gallbladder removal, which she believes was related to her diet and fatty liver disease.  She experiences lower back pain due to a bulging disc causing sciatica. An epidural was scheduled but  delayed due to insurance issues. She engages in Pilates once or twice a week to strengthen her core.  OBJECTIVE: Visit Diagnoses: Problem List Items Addressed This Visit     Generalized obesity   Relevant Medications   semaglutide -weight management (WEGOVY ) 1 MG/0.5ML SOAJ SQ injection   Vitamin D  insufficiency   Depression   Relevant Medications   topiramate  (TOPAMAX ) 25 MG tablet   Fatty liver disease, nonalcoholic   Relevant Medications   semaglutide -weight management (WEGOVY ) 1 MG/0.5ML SOAJ SQ injection   Other Visit Diagnoses       Polyphagia    -  Primary   Relevant Medications   semaglutide -weight management (WEGOVY ) 1 MG/0.5ML SOAJ SQ injection     Insulin  resistance       Relevant Medications   semaglutide -weight management (WEGOVY ) 1 MG/0.5ML SOAJ SQ injection     Other hyperlipidemia       Relevant Medications   semaglutide -weight management (WEGOVY ) 1 MG/0.5ML SOAJ SQ injection     BMI 30.0-30.9,adult Current BMI 30.1         Obesity with and polyphagia Obesity with associated insulin  resistance, metabolic associated fatty liver disease, hyperlipidemia, emotional eating, and polyphagia. Down one pound since last visit. Currently on Wegovy , awaiting insurance approval for continued use.  A1c is 5.2, indicating good glycemic control. LDL remains high, but Wegovy  is expected to help with hyperlipidemia over time.  Emotional eating and polyphagia are being addressed with journaling and dietary adjustments.  - Continue Wegovy  1 mg weekly pending insurance approval - Refill Wegovy  prescription - Continue dietary journaling and caloric intake monitoring -  Encourage increased fiber intake to help with constipation  Hyperlipidemia LDL is not at goal. Medication(s): Wegovy  1 mg weekly Denies mass in neck, dysphagia, dyspepsia, persistent hoarseness, abdominal pain, or N/V or diarrhea. Has annual eye exam. Mood is stable. ] + constipation Cardiovascular risk factors:  dyslipidemia and obesity (BMI >= 30 kg/m2)  Lab Results  Component Value Date   CHOL 228 (H) 07/27/2023   HDL 49 07/27/2023   LDLCALC 155 (H) 07/27/2023   TRIG 135 07/27/2023   Lab Results  Component Value Date   ALT 16 07/27/2023   AST 14 07/27/2023   ALKPHOS 48 07/27/2023   BILITOT 0.4 07/27/2023   The 10-year ASCVD risk score (Arnett DK, et al., 2019) is: 0.6%   Values used to calculate the score:     Age: 89 years     Clincally relevant sex: Female     Is Non-Hispanic African American: No     Diabetic: No     Tobacco smoker: No     Systolic Blood Pressure: 105 mmHg     Is BP treated: No     HDL Cholesterol: 49 mg/dL     Total Cholesterol: 228 mg/dL  Plan: Continue Wegovy  Continue to work on nutrition plan -decreasing simple carbohydrates, increasing lean proteins, decreasing saturated fats and cholesterol , avoiding trans fats and exercise as able to promote weight loss, improve lipids and decrease cardiovascular risks.  Migraine Recent increase in migraine frequency, possibly related to weather or environmental factors such as fluorescent lights. Previously on topiramate  for migraine prophylaxis, which was discontinued with the initiation of Wegovy . Considering reintroduction of topiramate  to manage migraines.  Patient denies history of glaucoma or kidney stones. She was informed of side effects and that topiramate  is teratogenic. Her long time partner is S/P vasectomy remotely. Agreeable to start topamax  for headache prophylaxis and should help with cravings as well.   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 Weight loss is expected to alleviate some of the pressure on the lower back. -  Reschedule epidural injection with new insurance - Continue Pilates and core strengthening exercises  Emotional eating/ stress Emotional eating/stress/focus currently managed with Wellbutrin  200 mg once daily. Reports improved focus and energy with Wellbutrin , which may also aid in managing cravings. - Continue Wellbutrin  200 mg once daily- no refills needed today - Consider increasing dose if focus issues persist  Vitamin B12 deficiency Vitamin B12 deficiency with current levels at 380. Prioritizing B12 supplementation. Lab Results  Component Value Date   VITAMINB12 380 07/27/2023    Continue B12 supplementation  Vitamin D  deficiency Vitamin D  deficiency. Took a break from supplementation during the summer. Vitamin D  levels remain on the lower side, which may contribute to lower energy levels. Last vitamin D  Lab Results  Component Value Date   VD25OH 32.9 07/27/2023   Low vitamin D  levels can be associated with adiposity and may result in leptin resistance and weight gain. Also associated with fatigue.  Currently on vitamin D  supplementation without any adverse effects such as nausea, vomiting or muscle weakness.  - Resume vitamin D  supplementation- Ergocalciferol  50,000 units once weekly.   Constipation Experiencing manageable constipation. - Increase dietary fiber intake - Maintain adequate hydration  Vitals Temp:  98.2 F (36.8 C) BP: 105/71 Pulse Rate: (!) 104 SpO2: 97 %   Anthropometric Measurements Height: 5' 6 (1.676 m) Weight: 186 lb (84.4 kg) BMI (Calculated): 30.04 Weight at Last Visit: 187 LB Weight Lost Since Last Visit: 1 lb Weight Gained Since Last Visit: 0 Starting Weight: 188 LB Total Weight Loss (lbs): 2 lb (0.907 kg) Peak Weight: 195 LB   Body Composition  Body Fat %: 35.5 % Fat Mass (lbs): 66.2 lbs Muscle Mass (lbs): 114.4 lbs Total Body Water (lbs): 76.2 lbs Visceral Fat Rating : 7   Other Clinical Data Fasting: NO Labs: NO Today's  Visit #: 31 Starting Date: 11/12/20     ASSESSMENT AND PLAN:  Diet: Jazlyne is currently in the action stage of change. As such, her goal is to continue with weight loss efforts. She has agreed to keeping a food journal and adhering to recommended goals of 1250 calories and 80+ grams of protein.  Exercise: Raejean has been instructed to work up to a goal of 150 minutes of combined cardio and strengthening exercise per week for weight loss and overall health benefits.   Behavior Modification:  We discussed the following Behavioral Modification Strategies today: increasing lean protein intake, decreasing simple carbohydrates, increasing vegetables, increase H2O intake, increase high fiber foods, meal planning and cooking strategies, emotional eating strategies , avoiding temptations, and planning for success. We discussed various medication options to help Texanna with her weight loss efforts and we both agreed to continue current treatment plan.  Return in about 4 weeks (around 09/20/2023).SABRA She was informed of the importance of frequent follow up visits to maximize her success with intensive lifestyle modifications for her multiple health conditions.  Attestation Statements:   Reviewed by clinician on day of visit: allergies, medications, problem list, medical history, surgical history, family history, social history, and previous encounter notes.   Time spent on visit including pre-visit chart review and post-visit care and charting was 35 minutes.    Rendi Mapel, PA-C

## 2023-08-23 ENCOUNTER — Other Ambulatory Visit (HOSPITAL_COMMUNITY): Payer: Self-pay

## 2023-08-23 ENCOUNTER — Other Ambulatory Visit: Payer: Self-pay

## 2023-08-23 ENCOUNTER — Encounter (INDEPENDENT_AMBULATORY_CARE_PROVIDER_SITE_OTHER): Payer: Self-pay | Admitting: Physician Assistant

## 2023-08-23 ENCOUNTER — Ambulatory Visit (INDEPENDENT_AMBULATORY_CARE_PROVIDER_SITE_OTHER): Admitting: Physician Assistant

## 2023-08-23 VITALS — BP 105/71 | HR 104 | Temp 98.2°F | Ht 66.0 in | Wt 186.0 lb

## 2023-08-23 DIAGNOSIS — K760 Fatty (change of) liver, not elsewhere classified: Secondary | ICD-10-CM

## 2023-08-23 DIAGNOSIS — R632 Polyphagia: Secondary | ICD-10-CM

## 2023-08-23 DIAGNOSIS — E559 Vitamin D deficiency, unspecified: Secondary | ICD-10-CM

## 2023-08-23 DIAGNOSIS — E88819 Insulin resistance, unspecified: Secondary | ICD-10-CM

## 2023-08-23 DIAGNOSIS — F5089 Other specified eating disorder: Secondary | ICD-10-CM

## 2023-08-23 DIAGNOSIS — F439 Reaction to severe stress, unspecified: Secondary | ICD-10-CM

## 2023-08-23 DIAGNOSIS — E669 Obesity, unspecified: Secondary | ICD-10-CM

## 2023-08-23 DIAGNOSIS — E7849 Other hyperlipidemia: Secondary | ICD-10-CM | POA: Diagnosis not present

## 2023-08-23 DIAGNOSIS — F3289 Other specified depressive episodes: Secondary | ICD-10-CM

## 2023-08-23 DIAGNOSIS — F32A Depression, unspecified: Secondary | ICD-10-CM

## 2023-08-23 DIAGNOSIS — Z6830 Body mass index (BMI) 30.0-30.9, adult: Secondary | ICD-10-CM

## 2023-08-23 DIAGNOSIS — G43909 Migraine, unspecified, not intractable, without status migrainosus: Secondary | ICD-10-CM

## 2023-08-23 MED ORDER — TOPIRAMATE 25 MG PO TABS
25.0000 mg | ORAL_TABLET | Freq: Every evening | ORAL | 0 refills | Status: DC
  Filled 2023-08-23: qty 45, 26d supply, fill #0

## 2023-08-23 MED ORDER — SEMAGLUTIDE-WEIGHT MANAGEMENT 1 MG/0.5ML ~~LOC~~ SOAJ
1.0000 mg | SUBCUTANEOUS | 0 refills | Status: DC
Start: 2023-08-23 — End: 2023-09-20
  Filled 2023-08-23: qty 2, 28d supply, fill #0

## 2023-09-20 ENCOUNTER — Other Ambulatory Visit: Payer: Self-pay

## 2023-09-20 ENCOUNTER — Ambulatory Visit (INDEPENDENT_AMBULATORY_CARE_PROVIDER_SITE_OTHER): Admitting: Physician Assistant

## 2023-09-20 ENCOUNTER — Other Ambulatory Visit (HOSPITAL_COMMUNITY): Payer: Self-pay

## 2023-09-20 ENCOUNTER — Encounter (INDEPENDENT_AMBULATORY_CARE_PROVIDER_SITE_OTHER): Payer: Self-pay | Admitting: Physician Assistant

## 2023-09-20 VITALS — BP 101/71 | HR 97 | Temp 98.0°F | Ht 66.0 in | Wt 186.0 lb

## 2023-09-20 DIAGNOSIS — E669 Obesity, unspecified: Secondary | ICD-10-CM

## 2023-09-20 DIAGNOSIS — E538 Deficiency of other specified B group vitamins: Secondary | ICD-10-CM

## 2023-09-20 DIAGNOSIS — E88819 Insulin resistance, unspecified: Secondary | ICD-10-CM | POA: Diagnosis not present

## 2023-09-20 DIAGNOSIS — E559 Vitamin D deficiency, unspecified: Secondary | ICD-10-CM | POA: Diagnosis not present

## 2023-09-20 DIAGNOSIS — Z6830 Body mass index (BMI) 30.0-30.9, adult: Secondary | ICD-10-CM

## 2023-09-20 DIAGNOSIS — E7849 Other hyperlipidemia: Secondary | ICD-10-CM | POA: Diagnosis not present

## 2023-09-20 DIAGNOSIS — J3089 Other allergic rhinitis: Secondary | ICD-10-CM

## 2023-09-20 DIAGNOSIS — R632 Polyphagia: Secondary | ICD-10-CM

## 2023-09-20 MED ORDER — FLUTICASONE PROPIONATE 50 MCG/ACT NA SUSP
2.0000 | Freq: Every day | NASAL | 6 refills | Status: AC
Start: 2023-09-20 — End: ?
  Filled 2023-09-20: qty 16, 30d supply, fill #0

## 2023-09-20 MED ORDER — SEMAGLUTIDE-WEIGHT MANAGEMENT 1 MG/0.5ML ~~LOC~~ SOAJ
1.0000 mg | SUBCUTANEOUS | 0 refills | Status: DC
  Filled 2023-09-20 – 2023-09-23 (×3): qty 2, 28d supply, fill #0

## 2023-09-20 MED ORDER — LORATADINE-PSEUDOEPHEDRINE ER 10-240 MG PO TB24
1.0000 | ORAL_TABLET | Freq: Every day | ORAL | 2 refills | Status: DC
  Filled 2023-09-20: qty 30, 30d supply, fill #0

## 2023-09-20 MED ORDER — VITAMIN B-12 500 MCG PO TABS
500.0000 ug | ORAL_TABLET | Freq: Every day | ORAL | 0 refills | Status: DC
  Filled 2023-09-20: qty 100, 100d supply, fill #0

## 2023-09-20 MED ORDER — VITAMIN D (ERGOCALCIFEROL) 1.25 MG (50000 UNIT) PO CAPS
50000.0000 [IU] | ORAL_CAPSULE | ORAL | 0 refills | Status: DC
  Filled 2023-09-20: qty 5, 35d supply, fill #0

## 2023-09-20 NOTE — Progress Notes (Unsigned)
 SUBJECTIVE: Discussed the use of AI scribe software for clinical note transcription with the patient, who gave verbal consent to proceed.  Chief Complaint: Obesity  Interim History: She has maintained her weight since last visit.    Sherri Cobb is here to discuss her progress with her obesity treatment plan. She is on the keeping a food journal and adhering to recommended goals of 1250 calories and 80 protein and states she is following her eating plan approximately 70 % of the time. She states she is exercising walking and strength training for 30 minutes 3 times per week. Sherri Cobb is a 43 year old female with obesity who presents for follow-up of her obesity treatment plan.  She experiences fatigue, which she attributes to allergies, despite consistent use of Flonase  and Claritin  D.  She is on Wegovy  1 mg weekly for medical weight loss and hyperlipidemia. She reports constipation and upset stomach, particularly after consuming sugar, but no other significant side effects. Her appetite has decreased, but her weight loss has slowed compared to her previous experience with Wegovy  in 2023-2024.   She intermittently uses topiramate  but does not require a refill currently.   She is mindful of her diet, aiming for 1250 calories and 80 grams of protein daily, but finds it challenging to track intake consistently, especially on busy days or weekends. She tries to make healthy choices when eating out and is considering using a OTC glucose monitor to understand her body's response to different foods.  She typically eats a small breakfast before 11 AM, often a [place] yogurt parfait or a muffin. She sometimes skips dinner if she eats a late lunch, but when she does have dinner, it is usually a small portion. Her partner prefers to have dinner together, which can be late in the evening.  She has a history of polyphagia, insulin  resistance, vitamin D  deficiency,hyperlipidemia, and nonalcoholic fatty  liver disease. She takes vitamin D  supplements and has had a documented low B12 level, for which she takes B12 supplements if covered by insurance.  OBJECTIVE: Visit Diagnoses: Problem List Items Addressed This Visit     Perennial allergic rhinitis   Relevant Medications   fluticasone  (FLONASE ) 50 MCG/ACT nasal spray   loratadine -pseudoephedrine  (CLARITIN -D 24-HOUR) 10-240 MG 24 hr tablet   Generalized obesity   Relevant Medications   semaglutide -weight management (WEGOVY ) 1 MG/0.5ML SOAJ SQ injection   Vitamin D  insufficiency   Relevant Medications   Vitamin D , Ergocalciferol , (DRISDOL ) 1.25 MG (50000 UNIT) CAPS capsule   B12 deficiency   Relevant Medications   vitamin B-12 (CYANOCOBALAMIN ) 500 MCG tablet   Other Visit Diagnoses       Polyphagia    -  Primary   Relevant Medications   semaglutide -weight management (WEGOVY ) 1 MG/0.5ML SOAJ SQ injection     Other hyperlipidemia       Relevant Medications   semaglutide -weight management (WEGOVY ) 1 MG/0.5ML SOAJ SQ injection     Insulin  resistance       Relevant Medications   semaglutide -weight management (WEGOVY ) 1 MG/0.5ML SOAJ SQ injection     BMI 30.0-30.9,adult Current BMI 30.1         Obesity with  polyphagia Obesity with insulin  resistance and polyphagia managed with Wegovy  1 mg weekly. Reports decreased appetite but slower weight loss compared to initial treatment phase. Experiencing constipation and upset stomach, particularly after consuming sugar. Discussed the potential for increasing Wegovy  dosage but decided to maintain current dose due to recent increase in July. Explored  alternative medications like Mounjaro and Zepbound, but cost and insurance coverage are prohibitive. Discussed the use of continuous glucose monitors (CGM) to identify personal glucose spikes and aid in dietary choices. Encouraged tracking food intake to increase mindfulness and identify incidental calorie intake. - Continue Wegovy  1 mg weekly. - She  is considering using a CGM like [employer]ingo for personal glucose monitoring. - Track food intake three days a week to increase mindfulness. - Provide dining out guide for healthier fast food choices.  Hyperlipidemia Hyperlipidemia managed with Wegovy , which also aids in weight loss and insulin  sensitivity. - Continue Wegovy  1 mg weekly.  Insulin  Resistance Last fasting insulin  was ***. A1c was ***. Polyphagia:{dwwyes:29172} Medication(s): {dwwpharmacotherapy:29109} Lab Results  Component Value Date   HGBA1C 5.2 07/27/2023   HGBA1C 5.1 03/22/2023   HGBA1C 5.2 04/01/2022   HGBA1C 5.0 12/09/2021   HGBA1C 5.1 06/03/2021   Lab Results  Component Value Date   INSULIN  18.1 07/27/2023   INSULIN  6.5 03/22/2023   INSULIN  10.9 04/01/2022   INSULIN  11.4 12/09/2021   INSULIN  10.9 06/03/2021    Plan: {dwwmed:29123} {dwwpharmacotherapy:29109}  Vitamin D  deficiency Vitamin D  deficiency managed with supplementation. - Continue vitamin D  supplementation.  Vitamin B12 deficiency Vitamin B12 deficiency managed with supplementation. Discussed potential insurance coverage for B12 supplementation due to documented low levels. - Continue B12 supplementation. - Check insurance coverage for B12 supplementation.  Allergic rhinitis Allergic rhinitis causing fatigue despite consistent use of allergy medications. Discussed past allergist visit and potential benefits of seeing an allergist again. Current medications include Flonase  and Claritin  D. - Continue Flonase  and Claritin  D. - Consider referral to an allergist if symptoms persist.  Vitals Temp: 98 F (36.7 C) BP: 101/71 Pulse Rate: 97 SpO2: 95 %   Anthropometric Measurements Height: 5' 6 (1.676 m) Weight: 186 lb (84.4 kg) BMI (Calculated): 30.04 Weight at Last Visit: 186 lb Weight Lost Since Last Visit: 0 Weight Gained Since Last Visit: 0 Starting Weight: 188 lb Total Weight Loss (lbs): 2 lb (0.907 kg) Peak Weight: 195 lb   Body  Composition  Body Fat %: 35.6 % Fat Mass (lbs): 66.2 lbs Muscle Mass (lbs): 114 lbs Total Body Water (lbs): 77 lbs Visceral Fat Rating : 7   Other Clinical Data Fasting: no Labs: no Today's Visit #: 32 Starting Date: 11/13/20     ASSESSMENT AND PLAN:  Diet: Devany is currently in the action stage of change. As such, her goal is to continue with weight loss efforts. She has agreed to keeping a food journal and adhering to recommended goals of 1250 calories and 80+ grams of protein.  Exercise: Leiyah has been instructed to work up to a goal of 150 minutes of combined cardio and strengthening exercise per week and to continue exercising as is for weight loss and overall health benefits.   Behavior Modification:  We discussed the following Behavioral Modification Strategies today: increasing lean protein intake, decreasing simple carbohydrates, increasing vegetables, increase H2O intake, increase high fiber foods, decreasing eating out, no skipping meals, meal planning and cooking strategies, avoiding temptations, and planning for success. We discussed various medication options to help Senie with her weight loss efforts and we both agreed to continue current treatment plan.  Return in about 4 weeks (around 10/18/2023).SABRA She was informed of the importance of frequent follow up visits to maximize her success with intensive lifestyle modifications for her multiple health conditions.  Attestation Statements:   Reviewed by clinician on day of visit: allergies, medications, problem list,  medical history, surgical history, family history, social history, and previous encounter notes.   Time spent on visit including pre-visit chart review and post-visit care and charting was *** minutes.    Jaramiah Bossard, PA-C

## 2023-09-21 ENCOUNTER — Other Ambulatory Visit: Payer: Self-pay

## 2023-09-22 ENCOUNTER — Other Ambulatory Visit: Payer: Self-pay

## 2023-09-23 ENCOUNTER — Other Ambulatory Visit (HOSPITAL_COMMUNITY): Payer: Self-pay

## 2023-09-29 ENCOUNTER — Ambulatory Visit: Admitting: Family Medicine

## 2023-10-18 ENCOUNTER — Encounter: Payer: Self-pay | Admitting: Family Medicine

## 2023-10-18 ENCOUNTER — Ambulatory Visit (INDEPENDENT_AMBULATORY_CARE_PROVIDER_SITE_OTHER): Admitting: Family Medicine

## 2023-10-18 VITALS — BP 106/74 | HR 90 | Temp 98.3°F | Ht 66.0 in | Wt 191.6 lb

## 2023-10-18 DIAGNOSIS — Z23 Encounter for immunization: Secondary | ICD-10-CM | POA: Diagnosis not present

## 2023-10-18 DIAGNOSIS — Z0000 Encounter for general adult medical examination without abnormal findings: Secondary | ICD-10-CM

## 2023-10-18 DIAGNOSIS — Z1231 Encounter for screening mammogram for malignant neoplasm of breast: Secondary | ICD-10-CM | POA: Diagnosis not present

## 2023-10-18 NOTE — Patient Instructions (Addendum)

## 2023-10-18 NOTE — Progress Notes (Unsigned)
 SUBJECTIVE: Discussed the use of AI scribe software for clinical note transcription with the patient, who gave verbal consent to proceed.  Chief Complaint: Obesity  Interim History: She is up 1 lb since her last visit.  Muscle mass +10.8 lbs Adipose mass - 10 lbs  Adipose % at 30% today, down from 35.6%  Sherri Cobb is here to discuss her progress with her obesity treatment plan. She is on the keeping a food journal and adhering to recommended goals of 1250 calories and 80 grams of protein and states she is following her eating plan approximately 50 % of the time. She states she is exercising walking/pilates/yoga 30 minutes 3-4 times per week.  Sherri Cobb is a 43 year old female who presents for a follow-up regarding weight management and medication refills.  She is actively managing her weight through exercise, including Pilates and walking, and journaling her meals to track caloric intake. She notes that her clothing fit remains unchanged.  She is currently taking Wegovy  1 mg weekly for weight management. Her hunger is manageable with Wegovy , but she experiences constipation as a side effect, which she manages with fiber intake. No excessive cravings.   She is also taking bupropion , which causes some dehydration and dry mouth but does not interfere with her sleep. She takes B12 supplements for energy, though not daily, and is on topiramate , which she sometimes forgets to take at bedtime.  She manages sugar cravings by keeping sweets out of the house and opting for small portions when available. Wegovy  does not completely eliminate her desire for sugar.  She takes a multivitamin that includes vitamin D  and plans to use vitamin D  capsules more regularly as winter approaches. She has also started a Gruns supplement with vitamins and probiotics.  OBJECTIVE: Visit Diagnoses: Problem List Items Addressed This Visit     Perennial allergic rhinitis   Relevant Medications    loratadine -pseudoephedrine  (CLARITIN -D 24-HOUR) 10-240 MG 24 hr tablet   Generalized obesity   Relevant Medications   semaglutide -weight management (WEGOVY ) 1 MG/0.5ML SOAJ SQ injection   Vitamin D  insufficiency   Relevant Medications   Vitamin D , Ergocalciferol , (DRISDOL ) 1.25 MG (50000 UNIT) CAPS capsule   B12 deficiency   Relevant Medications   vitamin B-12 (CYANOCOBALAMIN ) 500 MCG tablet   Depression   Relevant Medications   buPROPion  (WELLBUTRIN  SR) 200 MG 12 hr tablet   topiramate  (TOPAMAX ) 25 MG tablet   Other Visit Diagnoses       Polyphagia    -  Primary   Relevant Medications   semaglutide -weight management (WEGOVY ) 1 MG/0.5ML SOAJ SQ injection     Other hyperlipidemia       Relevant Medications   semaglutide -weight management (WEGOVY ) 1 MG/0.5ML SOAJ SQ injection     Insulin  resistance       Relevant Medications   semaglutide -weight management (WEGOVY ) 1 MG/0.5ML SOAJ SQ injection     BMI 30.0-30.9,adult Current BMI 30.3         Obesity with insulin  resistance and polyphagia  Obesity with insulin  resistance and polyphagia- well controlled on current Wegovy  dose. Body composition has improved with increased muscle mass and decreased adipose tissue. Hunger is manageable with current Wegovy  dose, though insurance issues may affect continued access. Constipation is managed with fiber intake and consideration of stool softeners. Sugar cravings persist but are managed by limiting availability of sweets at home. - Continue/refill Wegovy  1 mg weekly - Encourage continued exercise, including Pilates and walking. - Advise on managing constipation  with fiber intake and consider stool softeners. - Continue journaling meals to maintain accountability. - Schedule follow-up appointment on November 3rd at 2:30 PM.  Depression with emotional eating Depression is managed with bupropion  and topiramate . Bupropion  is associated with dry mouth, but no interference with sleep is reported.  Topiramate  is taken at bedtime, though adherence is inconsistent due to timing challenges. - Refill bupropion  prescription. - Refill topiramate  prescription to ensure adequate supply. Meds ordered this encounter  Medications   semaglutide -weight management (WEGOVY ) 1 MG/0.5ML SOAJ SQ injection    Sig: Inject 1 mg into the skin once a week.    Dispense:  2 mL    Refill:  0   vitamin B-12 (CYANOCOBALAMIN ) 500 MCG tablet    Sig: Take 1 tablet (500 mcg total) by mouth daily.    Dispense:  100 tablet    Refill:  0   loratadine -pseudoephedrine  (CLARITIN -D 24-HOUR) 10-240 MG 24 hr tablet    Sig: Take 1 tablet by mouth daily.    Dispense:  30 tablet    Refill:  2   buPROPion  (WELLBUTRIN  SR) 200 MG 12 hr tablet    Sig: Take 1 tablet (200 mg total) by mouth 2 (two) times daily.    Dispense:  180 tablet    Refill:  1   topiramate  (TOPAMAX ) 25 MG tablet    Sig: Take 1 tablet (25 mg total) by mouth at bedtime. Increase to 50 mg at bedtime nightly if tolerating well after 1 week.    Dispense:  45 tablet    Refill:  0   Vitamin D , Ergocalciferol , (DRISDOL ) 1.25 MG (50000 UNIT) CAPS capsule    Sig: Take 1 capsule (50,000 Units total) by mouth every 7 (seven) days.    Dispense:  5 capsule    Refill:  0    Vitamin D  deficiency Vitamin D  deficiency is managed with a multivitamin containing vitamin D . Additional vitamin D  capsules Ergocalciferol  50,000 units once weekly.  Last vitamin D  Lab Results  Component Value Date   VD25OH 32.9 07/27/2023   Low vitamin D  levels can be associated with adiposity and may result in leptin resistance and weight gain. Also associated with fatigue.  Currently on vitamin D  supplementation without any adverse effects such as nausea, vomiting or muscle weakness.  - Refill Ergocalciferol  50,000 units once weekly Plan to recheck vitamin D  level at December visit.   Deficiency of B 12 Deficiency of B12 is managed with B12 supplementation. No adverse effects  reported, and energy levels are satisfactory.  Plan:  - Continue/refill B12 500 mcg daily supplementation. Recheck B 12 levels at Dec OV.   Seasonal allergy On Claritin  D with good control , no SE.  Plan : Refill Claritin  D Vitals Temp: 98.4 F (36.9 C) BP: 112/72 Pulse Rate: 78 SpO2: 98 %   Anthropometric Measurements Height: 5' 6 (1.676 m) Weight: 187 lb (84.8 kg) BMI (Calculated): 30.2 Weight at Last Visit: 186 lb Weight Lost Since Last Visit: 0 Weight Gained Since Last Visit: 1 lb Starting Weight: 188 lb Total Weight Loss (lbs): 1 lb (0.454 kg) Peak Weight: 195 lb   Body Composition  Body Fat %: 30 % Fat Mass (lbs): 56.2 lbs Muscle Mass (lbs): 124.8 lbs Total Body Water (lbs): 796 lbs Visceral Fat Rating : 6   Other Clinical Data Fasting: No Labs: No Today's Visit #: 33 Starting Date: 11/13/20     ASSESSMENT AND PLAN:  Diet: Sherri Cobb is currently in the action stage of  change. As such, her goal is to continue with weight loss efforts. She has agreed to keeping a food journal and adhering to recommended goals of 1250 calories and 80+ grams of  protein.  Exercise: Sherri Cobb has been instructed to work up to a goal of 150 minutes of combined cardio and strengthening exercise per week and to continue exercising as is for weight loss and overall health benefits.   Behavior Modification:  We discussed the following Behavioral Modification Strategies today: increasing lean protein intake, decreasing simple carbohydrates, increasing vegetables, increase H2O intake, increase high fiber foods, no skipping meals, meal planning and cooking strategies, avoiding temptations, planning for success, and keep a strict food journal. We discussed various medication options to help Sherri Cobb with her weight loss efforts and we both agreed to continue current treatment plan.  Return in about 4 weeks (around 11/16/2023).SABRA She was informed of the importance of frequent follow up  visits to maximize her success with intensive lifestyle modifications for her multiple health conditions.  Attestation Statements:   Reviewed by clinician on day of visit: allergies, medications, problem list, medical history, surgical history, family history, social history, and previous encounter notes.   Time spent on visit including pre-visit chart review and post-visit care and charting was 31 minutes.    Boniface Goffe, PA-C

## 2023-10-18 NOTE — Progress Notes (Signed)
 Patient ID: Sherri Cobb, female  DOB: 14-Apr-1980, 43 y.o.   MRN: [id number] Patient Care Team    Relationship Specialty Notifications Start End  Catherine Charlies LABOR, DO PCP - General Family Medicine  11/14/20   Delores Clayborne LABOR, DO Consulting Physician Bariatrics  11/14/20   Cristy Bonner DASEN, MD Consulting Physician Orthopedic Surgery  11/14/20   [facility] Nurse Practitioner Gynecology  11/14/20    Comment: Nathanel Lesches, NP    Chief Complaint  Patient presents with   Annual Exam    Tdap- given Influenza vaccine-given    Subjective: Sherri Cobb is a 43 y.o.  Female  present for CPE and Chronic Conditions/illness Management All past medical history, surgical history, allergies, family history, immunizations, medications and social history were updated in the electronic medical record today. All recent labs, ED visits and hospitalizations within the last year were reviewed.  Health maintenance:  Colonoscopy:  no fhx. Routine screen at 45 Mammogram: no fhx, completed 02/23/2023>[facility]> ordered for 2026 Cervical cancer screening: last pap: 07/17/2020 Lesches, NP - gyn Immunizations: tdap updated today, Influenza updated today(encouraged yearly) Infectious disease screening: HIV and Hep C completed DEXA: routine screen 60-65 Patient has a Dental home. Hospitalizations/ED visits: reviewed     10/18/2023    1:18 PM 05/10/2023    1:12 PM 12/19/2020   10:29 AM 11/13/2020    8:29 AM 09/15/2017    9:55 AM  Depression screen PHQ 2/9  Decreased Interest 0 0 0 2 0  Down, Depressed, Hopeless 0 0 0 1 0  PHQ - 2 Score 0 0 0 3 0  Altered sleeping 1 0  2 0  Tired, decreased energy 3 3  3 3   Change in appetite 0 3  1 0  Feeling bad or failure about yourself  2 0  2 0  Trouble concentrating 0 3  1 0  Moving slowly or fidgety/restless 0 0  0 0  Suicidal thoughts 0 0  0 0  PHQ-9 Score 6 9  12 3   Difficult doing work/chores Somewhat difficult Somewhat difficult  Somewhat difficult       Immunization History  Administered Date(s) Administered   Influenza, Seasonal, Injecte, Preservative Fre 10/15/2022, 10/18/2023   Influenza-Unspecified 12/10/2017, 09/14/2018, 10/11/2020   PFIZER(Purple Top)SARS-COV-2 Vaccination 03/23/2019, 04/16/2019, 12/15/2019   Pfizer Covid-19 Vaccine Bivalent Booster 64yrs & up 11/02/2020   Tdap 01/11/2014, 10/18/2023   Past Medical History:  Diagnosis Date   Allergies    Allergy    Back pain    Chicken pox    Depression 02/04/2022   Fatty liver    Gallbladder problem    Hx of vaginitis 12/10/2017   Recurrent yeast with Mirena IUD Reported history of uterine infection, but unable to find that in her Care Everywhere chart.   Insulin  resistance 12/09/2021   Lactose intolerance    Menometrorrhagia    Before BCPs   Migraines    Psoriasis    No Known Allergies Past Surgical History:  Procedure Laterality Date   CHOLECYSTECTOMY N/A 07/12/2020   Procedure: LAPAROSCOPIC CHOLECYSTECTOMY;  Surgeon: Teresa Lonni HERO, MD;  Location: [facility];  Service: General;  Laterality: N/A;   KNEE ARTHROSCOPY WITH LATERAL RELEASE Right 12/27/2019   Procedure: KNEE ARTHROSCOPY WITH LATERAL RELEASE; REMOVAL LOOSE FOREIGN BODY; DEBRIDEMENT/SHAVING CHONDROPLASTY;  Surgeon: Cristy Bonner DASEN, MD;  Location: [facility];  Service: Orthopedics;  Laterality: Right;   LASIK Bilateral 2016   TIBIAL TUBERCLERPLASTY Right 12/27/2019  Procedure: LIGAMENT RECONSTRUCTION KNEE EXTRA-ARTICULAR, ANTERIOR TIBIAL TUBERCLEPLASTY, DECOMPRESSION FASCIOTOMY, LEG ANTERIOR;  Surgeon: Cristy Bonner DASEN, MD;  Location: [facility];  Service: Orthopedics;  Laterality: Right;   WISDOM TOOTH EXTRACTION     Family History  Problem Relation Age of Onset   Hypertension Mother    Hyperlipidemia Mother    Anxiety disorder Mother    Sleep apnea Mother    Thyroid  disease Father    Depression Father    Gout Father    Kidney Stones Father    Miscarriages /  Stillbirths Sister    Anxiety disorder Daughter    Depression Daughter    ADD / ADHD Daughter    Hypertension Maternal Grandmother    Depression Maternal Grandmother    Skin cancer Maternal Grandmother    Diverticulitis Maternal Grandmother    BRCA 1/2 Neg Hx    Breast cancer Neg Hx    Social History   Social History Narrative   Marital status/children/pets: Single/Div., 2 daughters   Education/employment: associates degree, administrative work   Field seismologist:      -smoke alarm in the home:Yes     - wears seatbelt: Yes     - Feels safe in their relationships: Yes       Allergies as of 10/18/2023   No Known Allergies      Medication List        Accurate as of October 18, 2023  2:07 PM. If you have any questions, ask your nurse or doctor.          buPROPion  200 MG 12 hr tablet Commonly known as: Wellbutrin  SR Take 1 tablet (200 mg total) by mouth 2 (two) times daily.   fluocinonide  ointment 0.05 % Commonly known as: LIDEX  Apply 1 Application topically 2 (two) times daily.   fluticasone  50 MCG/ACT nasal spray Commonly known as: FLONASE  Place 2 sprays into both nostrils daily.   FT Allergy Relief-D 10-240 MG 24 hr tablet Generic drug: loratadine -pseudoephedrine  Take 1 tablet by mouth daily.   gabapentin  100 MG capsule Commonly known as: NEURONTIN  Take 2 capsules (200 mg total) by mouth at bedtime.   multivitamin with minerals Tabs tablet Take 1 tablet by mouth daily.   topiramate  25 MG tablet Commonly known as: Topamax  Take 1 tablet (25 mg total) by mouth at bedtime. Increase to 50 mg at bedtime nightly if tolerating well after 1 week.   vitamin B-12 500 MCG tablet Commonly known as: CYANOCOBALAMIN  Take 1 tablet (500 mcg total) by mouth daily.   Vitamin D  (Ergocalciferol ) 1.25 MG (50000 UNIT) Caps capsule Commonly known as: DRISDOL  Take 1 capsule (50,000 Units total) by mouth every 7 (seven) days.   Wegovy  1 MG/0.5ML Soaj SQ injection Generic drug:  semaglutide -weight management Inject 1 mg into the skin once a week.        All past medical history, surgical history, allergies, family history, immunizations andmedications were updated in the EMR today and reviewed under the history and medication portions of their EMR.      ROS 14 pt review of systems performed and negative (unless mentioned in an HPI)  Objective BP 106/74   Pulse 90   Temp 98.3 F (36.8 C)   Ht 5' 6 (1.676 m)   Wt 191 lb 9.6 oz (86.9 kg)   LMP 10/04/2023   SpO2 98%   BMI 30.93 kg/m  Physical Exam Vitals and nursing note reviewed.  Constitutional:      General: She is not in acute  distress.    Appearance: Normal appearance. She is not ill-appearing or toxic-appearing.  HENT:     Head: Normocephalic and atraumatic.     Right Ear: Tympanic membrane, ear canal and external ear normal. There is no impacted cerumen.     Left Ear: Tympanic membrane, ear canal and external ear normal. There is no impacted cerumen.     Nose: No congestion or rhinorrhea.     Mouth/Throat:     Mouth: Mucous membranes are moist.     Pharynx: Oropharynx is clear. No oropharyngeal exudate or posterior oropharyngeal erythema.  Eyes:     General: No scleral icterus.       Right eye: No discharge.        Left eye: No discharge.     Extraocular Movements: Extraocular movements intact.     Conjunctiva/sclera: Conjunctivae normal.     Pupils: Pupils are equal, round, and reactive to light.  Cardiovascular:     Rate and Rhythm: Normal rate and regular rhythm.     Pulses: Normal pulses.     Heart sounds: Normal heart sounds. No murmur heard.    No friction rub. No gallop.  Pulmonary:     Effort: Pulmonary effort is normal. No respiratory distress.     Breath sounds: Normal breath sounds. No stridor. No wheezing, rhonchi or rales.  Chest:     Chest wall: No tenderness.  Abdominal:     General: Abdomen is flat. Bowel sounds are normal. There is no distension.     Palpations:  Abdomen is soft. There is no mass.     Tenderness: There is no abdominal tenderness. There is no right CVA tenderness, left CVA tenderness, guarding or rebound.     Hernia: No hernia is present.  Musculoskeletal:        General: No swelling, tenderness or deformity. Normal range of motion.     Cervical back: Normal range of motion and neck supple. No rigidity or tenderness.     Right lower leg: No edema.     Left lower leg: No edema.  Lymphadenopathy:     Cervical: No cervical adenopathy.  Skin:    General: Skin is warm and dry.     Coloration: Skin is not jaundiced or pale.     Findings: No bruising, erythema, lesion or rash.  Neurological:     General: No focal deficit present.     Mental Status: She is alert and oriented to person, place, and time. Mental status is at baseline.     Cranial Nerves: No cranial nerve deficit.     Sensory: No sensory deficit.     Motor: No weakness.     Coordination: Coordination normal.     Gait: Gait normal.     Deep Tendon Reflexes: Reflexes normal.  Psychiatric:        Mood and Affect: Mood normal.        Behavior: Behavior normal.        Thought Content: Thought content normal.        Judgment: Judgment normal.     No results found.  Assessment/plan: Sherri Cobb is a 43 y.o. female present for CPE Need for Tdap vaccination - Tdap vaccine greater than or equal to 7yo IM Influenza vaccine needed - Flu vaccine trivalent PF, 6mos and older(Flulaval,Afluria,Fluarix,Fluzone) Breast cancer screening by mammogram - MM 3D SCREENING MAMMOGRAM BILATERAL BREAST; Future Routine general medical examination at a health care facility (Primary) Patient was encouraged to exercise greater than  150 minutes a week. Patient was encouraged to choose a diet filled with fresh fruits and vegetables, and lean meats. AVS provided to patient today for education/recommendation on gender specific health and safety maintenance. Colonoscopy:  no fhx. Routine screen  at 47 Mammogram: no fhx, completed 02/23/2023>[facility]> ordered for 2026 Cervical cancer screening: last pap: 07/17/2020 [place], NP - gyn Immunizations: tdap updated today, Influenza updated today(encouraged yearly) Infectious disease screening: HIV and Hep C completed DEXA: routine screen 60-65  Return in about 1 year (around 10/18/2024) for cpe (20 min).   Orders Placed This Encounter  Procedures   MM 3D SCREENING MAMMOGRAM BILATERAL BREAST   Tdap vaccine greater than or equal to 7yo IM   Flu vaccine trivalent PF, 6mos and older(Flulaval,Afluria,Fluarix,Fluzone)    No orders of the defined types were placed in this encounter.   Referral Orders  No referral(s) requested today      Electronically signed by: Charlies Bellini, DO [facility]

## 2023-10-19 ENCOUNTER — Encounter (HOSPITAL_COMMUNITY): Payer: Self-pay

## 2023-10-19 ENCOUNTER — Other Ambulatory Visit (HOSPITAL_COMMUNITY): Payer: Self-pay

## 2023-10-19 ENCOUNTER — Other Ambulatory Visit: Payer: Self-pay

## 2023-10-19 ENCOUNTER — Ambulatory Visit (INDEPENDENT_AMBULATORY_CARE_PROVIDER_SITE_OTHER): Admitting: Physician Assistant

## 2023-10-19 ENCOUNTER — Encounter (INDEPENDENT_AMBULATORY_CARE_PROVIDER_SITE_OTHER): Payer: Self-pay | Admitting: Physician Assistant

## 2023-10-19 VITALS — BP 112/72 | HR 78 | Temp 98.4°F | Ht 66.0 in | Wt 187.0 lb

## 2023-10-19 DIAGNOSIS — E559 Vitamin D deficiency, unspecified: Secondary | ICD-10-CM | POA: Diagnosis not present

## 2023-10-19 DIAGNOSIS — E669 Obesity, unspecified: Secondary | ICD-10-CM

## 2023-10-19 DIAGNOSIS — J3089 Other allergic rhinitis: Secondary | ICD-10-CM

## 2023-10-19 DIAGNOSIS — F5089 Other specified eating disorder: Secondary | ICD-10-CM

## 2023-10-19 DIAGNOSIS — E538 Deficiency of other specified B group vitamins: Secondary | ICD-10-CM

## 2023-10-19 DIAGNOSIS — R632 Polyphagia: Secondary | ICD-10-CM | POA: Diagnosis not present

## 2023-10-19 DIAGNOSIS — E88819 Insulin resistance, unspecified: Secondary | ICD-10-CM

## 2023-10-19 DIAGNOSIS — E7849 Other hyperlipidemia: Secondary | ICD-10-CM

## 2023-10-19 DIAGNOSIS — F32A Depression, unspecified: Secondary | ICD-10-CM

## 2023-10-19 DIAGNOSIS — F3289 Other specified depressive episodes: Secondary | ICD-10-CM

## 2023-10-19 DIAGNOSIS — Z6830 Body mass index (BMI) 30.0-30.9, adult: Secondary | ICD-10-CM

## 2023-10-19 MED ORDER — BUPROPION HCL ER (SR) 200 MG PO TB12
200.0000 mg | ORAL_TABLET | Freq: Two times a day (BID) | ORAL | 1 refills | Status: AC
  Filled 2023-10-19: qty 180, 90d supply, fill #0

## 2023-10-19 MED ORDER — VITAMIN D (ERGOCALCIFEROL) 1.25 MG (50000 UNIT) PO CAPS
50000.0000 [IU] | ORAL_CAPSULE | ORAL | 0 refills | Status: AC
  Filled 2023-10-19: qty 5, 35d supply, fill #0

## 2023-10-19 MED ORDER — LORATADINE-PSEUDOEPHEDRINE ER 10-240 MG PO TB24
1.0000 | ORAL_TABLET | Freq: Every day | ORAL | 2 refills | Status: AC
  Filled 2023-10-19: qty 30, 30d supply, fill #0

## 2023-10-19 MED ORDER — SEMAGLUTIDE-WEIGHT MANAGEMENT 1 MG/0.5ML ~~LOC~~ SOAJ
1.0000 mg | SUBCUTANEOUS | 0 refills | Status: DC
  Filled 2023-10-19 – 2023-11-01 (×2): qty 2, 28d supply, fill #0

## 2023-10-19 MED ORDER — TOPIRAMATE 25 MG PO TABS
25.0000 mg | ORAL_TABLET | Freq: Every evening | ORAL | 0 refills | Status: AC
  Filled 2023-10-19: qty 45, 30d supply, fill #0

## 2023-10-19 MED ORDER — VITAMIN B-12 500 MCG PO TABS
500.0000 ug | ORAL_TABLET | Freq: Every day | ORAL | 0 refills | Status: AC
  Filled 2023-10-19: qty 100, 100d supply, fill #0

## 2023-10-20 ENCOUNTER — Other Ambulatory Visit (HOSPITAL_COMMUNITY): Payer: Self-pay

## 2023-10-20 ENCOUNTER — Other Ambulatory Visit: Payer: Self-pay

## 2023-10-20 NOTE — Progress Notes (Unsigned)
 [name] [address] [place] [address] Phone: [phone] Subjective:   Sherri Cobb, am serving as a scribe for Dr. Arthea Claudene.  I'm seeing this patient by the request  of:  Kuneff, Renee A, DO  CC: Low back pain  YEP:Dlagzrupcz  08/03/2023 Discussed with patient at great length.  We discussed there is a possibility of a protruding disc causing some impingement.  Patient would like to consider the possibility of injection.  Will schedule an epidural in the near future.  I do think that this could help out significantly with some of the pain as well as help us  diagnose.  Increase activity slowly otherwise.  Follow-up with me again 6 to 8 weeks after the injection.     Updated 10/25/2023 Sherri Cobb is a 43 y.o. female coming in with complaint of low back pain. Patient states that she has not had epidural due to insurance coverage. Patient states that her back is not worse.   Patient did have MRIs in May.  An MRI of the lumbar spine showed abnormal signal of the right pedicle with a atypical hemangioma.  This is at the L2-L3 level.  Degenerative disc disease was noted at L3-L5 with questionable L4 and L5 nerve root impingement    Past Medical History:  Diagnosis Date   Allergies    Allergy    Back pain    Chicken pox    Depression 02/04/2022   Fatty liver    Gallbladder problem    Hx of vaginitis 12/10/2017   Recurrent yeast with Mirena IUD Reported history of uterine infection, but unable to find that in her Care Everywhere chart.   Insulin  resistance 12/09/2021   Lactose intolerance    Menometrorrhagia    Before BCPs   Migraines    Psoriasis    Past Surgical History:  Procedure Laterality Date   CHOLECYSTECTOMY N/A 07/12/2020   Procedure: LAPAROSCOPIC CHOLECYSTECTOMY;  Surgeon: Teresa Lonni HERO, MD;  Location: [facility];  Service: General;  Laterality: N/A;   KNEE ARTHROSCOPY WITH LATERAL RELEASE Right 12/27/2019    Procedure: KNEE ARTHROSCOPY WITH LATERAL RELEASE; REMOVAL LOOSE FOREIGN BODY; DEBRIDEMENT/SHAVING CHONDROPLASTY;  Surgeon: Cristy Bonner DASEN, MD;  Location: [facility];  Service: Orthopedics;  Laterality: Right;   LASIK Bilateral 2016   TIBIAL TUBERCLERPLASTY Right 12/27/2019   Procedure: LIGAMENT RECONSTRUCTION KNEE EXTRA-ARTICULAR, ANTERIOR TIBIAL TUBERCLEPLASTY, DECOMPRESSION FASCIOTOMY, LEG ANTERIOR;  Surgeon: Cristy Bonner DASEN, MD;  Location: [facility];  Service: Orthopedics;  Laterality: Right;   WISDOM TOOTH EXTRACTION     Social History   Socioeconomic History   Marital status: Domestic Partner    Spouse name: Belvie   Number of children: 2   Years of education: Not on file   Highest education level: Associate degree: occupational, Scientist, product/process development, or vocational program  Occupational History   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  Other Topics Concern   Not on file  Social History Narrative   Marital status/children/pets: Single/Div., 2 daughters   Education/employment:  associates degree, administrative work   Field seismologist:      -smoke alarm in the home:Yes     - wears seatbelt: Yes     - Feels safe in their relationships: Yes      Social Drivers of Health   Financial Resource Strain: Medium Risk (05/10/2023)   Overall Financial Resource Strain (CARDIA)    Difficulty of Paying Living Expenses: Somewhat hard  Food Insecurity: No Food Insecurity (05/10/2023)   Hunger Vital Sign    Worried About Running Out of Food in the Last Year: Never true    Ran Out of Food in the Last Year: Never true   Transportation Needs: No Transportation Needs (05/10/2023)   PRAPARE - Administrator, Civil Service (Medical): No    Lack of Transportation (Non-Medical): No  Physical Activity: Sufficiently Active (05/10/2023)   Exercise Vital Sign    Days of Exercise per Week: 5 days    Minutes of Exercise per Session: 30 min  Stress: No Stress Concern Present (05/10/2023)   [employer] of Occupational Health - Occupational Stress Questionnaire    Feeling of Stress : Only a little  Social Connections: Moderately Isolated (05/10/2023)   Social Connection and Isolation Panel    Frequency of Communication with Friends and Family: More than three times a week    Frequency of Social Gatherings with Friends and Family: Once a week    Attends Religious Services: Never    Database administrator or Organizations: Yes    Attends Engineer, structural: More than 4 times per year    Marital Status: Divorced   No Known Allergies Family History  Problem Relation Age of Onset   Hypertension Mother    Hyperlipidemia Mother    Anxiety disorder Mother    Sleep apnea Mother    Thyroid  disease Father    Depression Father    Gout Father    Kidney Stones Father    Miscarriages / Stillbirths Sister    Anxiety disorder Daughter    Depression Daughter    ADD / ADHD Daughter    Hypertension Maternal Grandmother    Depression Maternal Grandmother    Skin cancer Maternal Grandmother    Diverticulitis Maternal Grandmother    BRCA 1/2 Neg Hx    Breast cancer Neg Hx       Current Outpatient Medications (Respiratory):    fluticasone  (FLONASE ) 50 MCG/ACT nasal spray, Place 2 sprays into both nostrils daily.   loratadine -pseudoephedrine  (CLARITIN -D 24-HOUR) 10-240 MG 24 hr tablet, Take 1 tablet by mouth daily.   Current Outpatient Medications (Hematological):    vitamin B-12 (CYANOCOBALAMIN ) 500 MCG tablet, Take 1 tablet (500 mcg total) by mouth daily.  Current Outpatient Medications  (Other):    buPROPion  (WELLBUTRIN  SR) 200 MG 12 hr tablet, Take 1 tablet (200 mg total) by mouth 2 (two) times daily.   fluocinonide  ointment (LIDEX ) 0.05 %, Apply 1 Application topically 2 (two) times daily.   gabapentin  (NEURONTIN ) 100 MG capsule, Take 2 capsules (200 mg total) by mouth at bedtime.   Multiple Vitamin (MULTIVITAMIN WITH MINERALS) TABS tablet, Take 1 tablet by mouth daily.   semaglutide -weight management (WEGOVY ) 1 MG/0.5ML SOAJ SQ injection, Inject 1 mg into the skin once a week.   topiramate  (TOPAMAX ) 25 MG tablet, Take 1 tablet (25 mg total) by mouth at bedtime. Increase to 50 mg at bedtime nightly if tolerating well after 1 week.   Vitamin D , Ergocalciferol , (DRISDOL ) 1.25 MG (50000 UNIT) CAPS  capsule, Take 1 capsule (50,000 Units total) by mouth every 7 (seven) days.   Reviewed prior external information including notes and imaging from  primary care provider As well as notes that were available from care everywhere and other healthcare systems.  Past medical history, social, surgical and family history all reviewed in electronic medical record.  No pertanent information unless stated regarding to the chief complaint.   Review of Systems:  No headache, visual changes, nausea, vomiting, diarrhea, constipation, dizziness, abdominal pain, skin rash, fevers, chills, night sweats, weight loss, swollen lymph nodes, body aches, joint swelling, chest pain, shortness of breath, mood changes. POSITIVE muscle aches  Objective  Blood pressure 112/80, pulse (!) 103, height 5' 6 (1.676 m), last menstrual period 10/04/2023, SpO2 98%.   General: No apparent distress alert and oriented x3 mood and affect normal, dressed appropriately.  HEENT: Pupils equal, extraocular movements intact  Respiratory: Patient's speak in full sentences and does not appear short of breath  Cardiovascular: No lower extremity edema, non tender, no erythema  Low back exam shows patient's that has more  tightness than usual.  Hypermobility of multiple other joints noted.  Patient does have tightness with straight leg test.  Mild potential radicular symptoms.  Tender left leg with forward flexion of 20 degrees.  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  Her lowest on most is within the differential.  We discussed which activities to do and which ones to avoid.  Increase activity slowly.  Patient will continue to work on strengthening.  Awaiting with patient's back pain and the possibility of an epidural but would have to find an area that does take her insurance.  Increase activity slowly otherwise.  Follow-up again in 6 to 8 weeks.  Acute left lumbar radiculopathy Awaiting approval for epidural.  Do think it would be significantly beneficial.  Attempted some muscle energy on the lower back today.    Decision today to treat with OMT was based on Physical Exam  After verbal consent patient was treated with HVLA, ME, FPR techniques in cervical, thoracic, rib, lumbar and sacral areas, all areas are chronic   Patient tolerated the procedure well with improvement in symptoms  Patient given exercises, stretches and lifestyle modifications  See medications in patient instructions if given  Patient will follow up in 4-8 weeks   The above documentation has been reviewed and is accurate and complete Toluwani Ruder M Yovanny Coats, DO

## 2023-10-25 ENCOUNTER — Ambulatory Visit (INDEPENDENT_AMBULATORY_CARE_PROVIDER_SITE_OTHER): Admitting: Family Medicine

## 2023-10-25 VITALS — BP 112/80 | HR 103 | Ht 66.0 in

## 2023-10-25 DIAGNOSIS — M9908 Segmental and somatic dysfunction of rib cage: Secondary | ICD-10-CM | POA: Diagnosis not present

## 2023-10-25 DIAGNOSIS — M9903 Segmental and somatic dysfunction of lumbar region: Secondary | ICD-10-CM

## 2023-10-25 DIAGNOSIS — M9904 Segmental and somatic dysfunction of sacral region: Secondary | ICD-10-CM

## 2023-10-25 DIAGNOSIS — M2550 Pain in unspecified joint: Secondary | ICD-10-CM

## 2023-10-25 DIAGNOSIS — M9901 Segmental and somatic dysfunction of cervical region: Secondary | ICD-10-CM | POA: Diagnosis not present

## 2023-10-25 DIAGNOSIS — M5416 Radiculopathy, lumbar region: Secondary | ICD-10-CM | POA: Diagnosis not present

## 2023-10-25 DIAGNOSIS — M9902 Segmental and somatic dysfunction of thoracic region: Secondary | ICD-10-CM

## 2023-10-25 NOTE — Assessment & Plan Note (Signed)
 Does have a hypermobility noted.  Her lowest on most is within the differential.  We discussed which activities to do and which ones to avoid.  Increase activity slowly.  Patient will continue to work on strengthening.  Awaiting with patient's back pain and the possibility of an epidural but would have to find an area that does take her insurance.  Increase activity slowly otherwise.  Follow-up again in 6 to 8 weeks.

## 2023-10-25 NOTE — Patient Instructions (Signed)
 Dr. Eldonna with [facility] See me again in 2 months

## 2023-10-25 NOTE — Assessment & Plan Note (Signed)
 Awaiting approval for epidural.  Do think it would be significantly beneficial.  Attempted some muscle energy on the lower back today.

## 2023-10-27 ENCOUNTER — Telehealth (INDEPENDENT_AMBULATORY_CARE_PROVIDER_SITE_OTHER): Payer: Self-pay | Admitting: *Deleted

## 2023-10-27 NOTE — Telephone Encounter (Signed)
 Sherri Cobb (Key: [id number])  Your information has been sent to [facility].

## 2023-11-01 ENCOUNTER — Other Ambulatory Visit (HOSPITAL_COMMUNITY): Payer: Self-pay

## 2023-11-01 ENCOUNTER — Telehealth (HOSPITAL_COMMUNITY): Payer: Self-pay | Admitting: Pharmacy Technician

## 2023-11-02 ENCOUNTER — Other Ambulatory Visit (HOSPITAL_COMMUNITY): Payer: Self-pay

## 2023-11-02 ENCOUNTER — Encounter: Payer: Self-pay | Admitting: Family Medicine

## 2023-11-02 ENCOUNTER — Telehealth (HOSPITAL_COMMUNITY): Payer: Self-pay

## 2023-11-02 DIAGNOSIS — Z1283 Encounter for screening for malignant neoplasm of skin: Secondary | ICD-10-CM

## 2023-11-02 NOTE — Telephone Encounter (Signed)
 PA request has been Received. New Encounter has been or will be created for follow up. For additional info see Pharmacy Prior Auth telephone encounter from 11/02/23.

## 2023-11-02 NOTE — Telephone Encounter (Signed)
 Referral can be placed to [facility] health dermatology on [address] for skin cancer screening/skin check.  Please place referral

## 2023-11-02 NOTE — Telephone Encounter (Signed)
 Pharmacy Patient Advocate Encounter   Received notification from Pt Calls Messages that prior authorization for Wegovy  1 mg/0.5 ml auto injectors is required/requested.   Insurance verification completed.   The patient is insured through [employer].   Per test claim: Effective October 1st, Medicaid will discontinue coverage of GLP1 medications for weight loss (such as Wegovy  and Zepbound), unless the patient has a documented history of a heart attack or stroke. Zepbound will continue to be covered only for patients with moderate to severe sleep apnea (AHI 15-30) and a BMI greater than 40. Because of this change, the prior authorization team will not be submitting new PA requests for GLP1 medications prescribed for weight loss, as patients will be unable to continue therapy under Medicaid coverage.

## 2023-11-03 NOTE — Addendum Note (Signed)
 Addended by: GEORGEAN BEEN A on: 11/03/2023 11:36 AM   Modules accepted: Orders

## 2023-11-08 NOTE — Telephone Encounter (Signed)
 Message from Plan Denied. WEGOVY  Soln Auto-inj is an excluded product/service. The health plan does not cover use of Glucagon-Like Peptide-1s (GLP-1s) for weight management.  Patient notified.

## 2023-11-09 ENCOUNTER — Encounter (INDEPENDENT_AMBULATORY_CARE_PROVIDER_SITE_OTHER): Payer: Self-pay | Admitting: Physician Assistant

## 2023-11-10 NOTE — Telephone Encounter (Signed)
 Sherri Cobb (Key: [id number])  Your information has been sent to [facility].

## 2023-11-13 NOTE — Progress Notes (Unsigned)
 SUBJECTIVE: Discussed the use of AI scribe software for clinical note transcription with the patient, who gave verbal consent to proceed.  Chief Complaint: Obesity  Interim History: She is up 1 lb since her last visit.   Sherri Cobb is here to discuss her progress with her obesity treatment plan. She is on the keeping a food journal and adhering to recommended goals of 1250 calories and 80 grams of protein and states she is following her eating plan approximately 75 % of the time. She states she is exercising walking/yoga 30 minutes 3 times per week.  Sherri Cobb is a 43 year old female with obesity who presents for follow-up on her obesity treatment plan.  She has a history of insulin  resistance, polyphagia, hyperlipidemia, vitamin D  and B12 deficiency, and emotional eating behavior. She follows a dietary plan of 1250 calories and 80 grams of protein approximately 75% of the time. She consumes more whole foods, meets protein requirements, stays hydrated, and avoids skipping meals. She sleeps 7 to 9 hours per night and engages in walking and yoga for 30 minutes three days per week.  She has been on Wegovy  1 mg weekly since mid-July 2025, experiencing a significantly lower appetite but continues to struggle with sweet cravings at night. She also takes topiramate  for emotional eating and cravings, and bupropion  SR 200 mg twice daily. She continues on B12 supplement 500 mcg daily and ergocalciferol  50,000 units once weekly for vitamin D  deficiency.  She has been unable to obtain Wegovy  due to insurance issues, despite a prior authorization attempt, and is concerned about its availability and impact on her weight management.  She experiences lower back pain affecting her ability to work out and has a referral for an epidural for pain management, hoping it will aid her exercise routine.  She has tried phentermine  in the past, which helped with energy and weight loss, but was not on it for long  before switching to Wegovy . She mentions being borderline narcoleptic, affecting her energy levels.  She uses the BitePal app to track her food intake but finds it challenging to accurately record consumption, especially with partial portions. She tries to make healthy choices for her family but struggles with willpower in meal planning and portion control. OBJECTIVE: Visit Diagnoses: Problem List Items Addressed This Visit     Generalized obesity   Relevant Medications   Phentermine  HCl 8 MG TABS   Vitamin D  insufficiency   B12 deficiency   Depression   Other Visit Diagnoses       Polyphagia    -  Primary   Relevant Medications   Phentermine  HCl 8 MG TABS     Other hyperlipidemia         Insulin  resistance         BMI 30.0-30.9,adult Current BMI 30.4         Obesity with  polyphagia, and emotional eating behavior Obesity with associated insulin  resistance, hyperlipidemia, polyphagia, and emotional eating behavior. Currently on Wegovy  1 mg weekly since mid-July, with significant reduction in appetite but persistent sweet cravings at night. Concerns about Wegovy 's efficacy and insurance coverage. Discussed potential switch to Zepbound, which may offer better weight loss outcomes but is less covered by insurance. Consideration of phentermine  to aid in weight loss and energy levels. Discussed potential side effects of phentermine , including increased heart rate and insomnia. Discussed the possibility of using Zepbound if insurance allows, noting better weight loss outcomes in clinical trials compared to Wegovy . - Continue  Wegovy   1mg  weekly until current supply is exhausted. - Started phentermine  8 mg daily, with potential to increase to twice daily if effective. I have consulted the [place] Controlled Substances Registry for this patient, and feel the risk/benefit ratio today is favorable for proceeding with this prescription for a controlled substance. No aberrancies noted.  She signed  agreement to start Phentermine  and abide by agreement for treatment with phentermine .  - Will consider Zepbound if insurance allows, noting better weight loss outcomes in clinical trials compared to Wegovy . - Monitor for side effects of phentermine , including increased heart rate and insomnia. - Encouraged continued use of calorie tracking app and consideration of category meal plans. Meds ordered this encounter  Medications   Phentermine  HCl 8 MG TABS    Sig: Take 1 tablet (8 mg total) by mouth daily at 6 (six) AM.    Dispense:  28 tablet    Refill:  0    Vitamin D  and B12 deficiency Managed with supplements. Currently on ergocalciferol  50,000 units weekly and B12 500 mcg daily. No SE with either supplement Last vitamin D  Lab Results  Component Value Date   VD25OH 32.9 07/27/2023   Lab Results  Component Value Date   VITAMINB12 380 07/27/2023    - Continue ergocalciferol  50,000 units weekly. - Continue B12 500 mcg daily.  Depression with emotional/stress eating Managed with bupropion  SR 200 mg twice daily. No current issues reported with medication. - Continue bupropion  SR 200 mg twice daily.- no refilled needed   Chronic low back pain Affecting ability to engage in strength training. Referral to pain management for epidural injection in place. Awaiting contact from Dr. JENEANE Smith's office for scheduling. - Follow up with pain management for epidural injection scheduling.  Vitals Temp: 98.3 F (36.8 C) BP: 110/77 Pulse Rate: 83 SpO2: 97 %   Anthropometric Measurements Height: 5' 6 (1.676 m) Weight: 188 lb (85.3 kg) BMI (Calculated): 30.36 Weight at Last Visit: 186 lb Weight Lost Since Last Visit: 0 Weight Gained Since Last Visit: 1 lb Starting Weight: 188 lb Total Weight Loss (lbs): 0 lb (0 kg) Peak Weight: 195 lb   Body Composition  Body Fat %: 36.7 % Fat Mass (lbs): 69 lbs Muscle Mass (lbs): 113.2 lbs Total Body Water (lbs): 42.5 lbs Visceral Fat Rating :  7   Other Clinical Data Fasting: No Labs: No Today's Visit #: 34 Starting Date: 11/13/20     ASSESSMENT AND PLAN:  Diet: Shawnta is currently in the action stage of change. As such, her goal is to continue with weight loss efforts. She has agreed to keeping a food journal and adhering to recommended goals of 1250 calories and 80+ grams of protein.  Exercise: Suhana has been instructed to work up to a goal of 150 minutes of combined cardio and strengthening exercise per week for weight loss and overall health benefits.   Behavior Modification:  We discussed the following Behavioral Modification Strategies today: increasing lean protein intake, decreasing simple carbohydrates, increasing vegetables, increase H2O intake, increase high fiber foods, no skipping meals, meal planning and cooking strategies, emotional eating strategies , avoiding temptations, planning for success, and keep a strict food journal. We discussed various medication options to help Eboni with her weight loss efforts and we both agreed to start Phentermine  8 mg daily and continue other usual medications.  Return in about 4 weeks (around 12/12/2023).SABRA She was informed of the importance of frequent follow up visits to maximize her success with intensive  lifestyle modifications for her multiple health conditions.  Attestation Statements:   Reviewed by clinician on day of visit: allergies, medications, problem list, medical history, surgical history, family history, social history, and previous encounter notes.   Time spent on visit including pre-visit chart review and post-visit care and charting was 42 minutes.    Christene Pounds, PA-C

## 2023-11-14 ENCOUNTER — Encounter (INDEPENDENT_AMBULATORY_CARE_PROVIDER_SITE_OTHER): Payer: Self-pay | Admitting: Physician Assistant

## 2023-11-14 ENCOUNTER — Other Ambulatory Visit: Payer: Self-pay

## 2023-11-14 ENCOUNTER — Ambulatory Visit (INDEPENDENT_AMBULATORY_CARE_PROVIDER_SITE_OTHER): Payer: Self-pay | Admitting: Physician Assistant

## 2023-11-14 VITALS — BP 110/77 | HR 83 | Temp 98.3°F | Ht 66.0 in | Wt 188.0 lb

## 2023-11-14 DIAGNOSIS — Z6830 Body mass index (BMI) 30.0-30.9, adult: Secondary | ICD-10-CM

## 2023-11-14 DIAGNOSIS — G8929 Other chronic pain: Secondary | ICD-10-CM

## 2023-11-14 DIAGNOSIS — F32A Depression, unspecified: Secondary | ICD-10-CM

## 2023-11-14 DIAGNOSIS — E559 Vitamin D deficiency, unspecified: Secondary | ICD-10-CM

## 2023-11-14 DIAGNOSIS — E538 Deficiency of other specified B group vitamins: Secondary | ICD-10-CM

## 2023-11-14 DIAGNOSIS — R632 Polyphagia: Secondary | ICD-10-CM

## 2023-11-14 DIAGNOSIS — E669 Obesity, unspecified: Secondary | ICD-10-CM

## 2023-11-14 DIAGNOSIS — F3289 Other specified depressive episodes: Secondary | ICD-10-CM

## 2023-11-14 DIAGNOSIS — M545 Low back pain, unspecified: Secondary | ICD-10-CM

## 2023-11-14 DIAGNOSIS — E7849 Other hyperlipidemia: Secondary | ICD-10-CM

## 2023-11-14 DIAGNOSIS — E88819 Insulin resistance, unspecified: Secondary | ICD-10-CM

## 2023-11-14 MED ORDER — PHENTERMINE HCL 8 MG PO TABS
8.0000 mg | ORAL_TABLET | Freq: Every day | ORAL | 0 refills | Status: AC
  Filled 2023-11-14: qty 28, 28d supply, fill #0

## 2023-11-14 NOTE — Telephone Encounter (Signed)
 Medication was denied and notified in previous encounter

## 2023-11-22 ENCOUNTER — Encounter: Payer: Self-pay | Admitting: Physical Medicine & Rehabilitation

## 2023-12-19 ENCOUNTER — Encounter (INDEPENDENT_AMBULATORY_CARE_PROVIDER_SITE_OTHER): Payer: Self-pay | Admitting: Physician Assistant

## 2023-12-20 ENCOUNTER — Ambulatory Visit (INDEPENDENT_AMBULATORY_CARE_PROVIDER_SITE_OTHER): Payer: Self-pay | Admitting: Physician Assistant

## 2023-12-23 ENCOUNTER — Other Ambulatory Visit: Payer: Self-pay

## 2023-12-23 ENCOUNTER — Telehealth (HOSPITAL_COMMUNITY): Payer: Self-pay

## 2023-12-23 ENCOUNTER — Other Ambulatory Visit (HOSPITAL_COMMUNITY): Payer: Self-pay

## 2023-12-23 NOTE — Telephone Encounter (Signed)
 Pharmacy Patient Advocate Encounter   Received notification from Patient Pharmacy that prior authorization for Lomaira  8 mg tablets is required/requested.   Insurance verification completed.   The patient is insured through [facility].   Per test claim: Per test claim, medication is not covered due to plan/benefit exclusion, PA not submitted at this time     *Did a test claim on [employer] program and it would be $29.25 for 28 tablets.

## 2023-12-26 NOTE — Progress Notes (Unsigned)
 [facility] [address] [place] [address] Phone: [phone] Subjective:   Sherri Cobb, am serving as Cobb scribe for Dr. Arthea Claudene.  I'Cobb seeing this patient by the request  of:  Kuneff, Renee A, DO  CC: Back and neck pain follow-up  Sherri Cobb  Sherri Cobb is Cobb 43 y.o. female coming in with complaint of back and neck pain. OMT on 10/25/2023. Patient states that her back is the same as last visit. Patient will meet with Dr. Eldonna at the end of the month. Some days are better than others. Was sick Cobb few weeks ago and she had increase in back pain.   Medications patient has been prescribed:   Taking:         Reviewed prior external information including notes and imaging from previsou exam, outside providers and external EMR if available.   As well as notes that were available from care everywhere and other healthcare systems.  Past medical history, social, surgical and family history all reviewed in electronic medical record.  No pertanent information unless stated regarding to the chief complaint.   Past Medical History:  Diagnosis Date   Allergies    Allergy    Back pain    Chicken pox    Depression 02/04/2022   Fatty liver    Gallbladder problem    Hx of vaginitis 12/10/2017   Recurrent yeast with Mirena IUD Reported history of uterine infection, but unable to find that in her Care Everywhere chart.   Insulin  resistance 12/09/2021   Lactose intolerance    Menometrorrhagia    Before BCPs   Migraines    Psoriasis     Allergies[1]   Review of Systems:  No headache, visual changes, nausea, vomiting, diarrhea, constipation, dizziness, abdominal pain, skin rash, fevers, chills, night sweats, weight loss, swollen lymph nodes, body aches, joint swelling, chest pain, shortness of breath, mood changes. POSITIVE muscle aches  Objective  Blood pressure 108/76, pulse 79, height 5' 6 (1.676 Cobb), weight 193 lb (87.5 kg),  SpO2 98%.   General: No apparent distress alert and oriented x3 mood and affect normal, dressed appropriately.  HEENT: Pupils equal, extraocular movements intact  Respiratory: Patient's speak in full sentences and does not appear short of breath  Cardiovascular: No lower extremity edema, non tender, no erythema  Gait MSK:  Back does have Cobb loss of lordosis noted.  Some tenderness to palpation still on the left side of the paraspinal musculature.  Osteopathic findings  C3 flexed rotated and side bent right C6 flexed rotated and side bent left T3 extended rotated and side bent right inhaled rib T9 extended rotated and side bent left L2 flexed rotated and side bent right L3 flexed rotated and side bent left Sacrum right on right       Assessment and Plan:  Acute left lumbar radiculopathy Continues to have some radicular symptoms.  Looking forward to the possibility of an epidural.  Patient is seeing another provider in the relatively near future.  We discussed with patient that I do think that this will be diagnostic as well as potentially therapeutic.  Increase activity slowly.  Discussed icing regimen.  Follow-up again in 6 to 12 weeks    Nonallopathic problems  Decision today to treat with OMT was based on Physical Exam  After verbal consent patient was treated with HVLA, ME, FPR techniques in cervical, rib, thoracic, lumbar, and sacral  areas  Patient tolerated the procedure well  with improvement in symptoms  Patient given exercises, stretches and lifestyle modifications  See medications in patient instructions if given  Patient will follow up in 4-8 weeks    The above documentation has been reviewed and is accurate and complete Sherri Cobb Sherri Braddock, DO          Note: This dictation was prepared with Dragon dictation along with smaller phrase technology. Any transcriptional errors that result from this process are unintentional.            [1] No Known  Allergies

## 2023-12-27 ENCOUNTER — Encounter (INDEPENDENT_AMBULATORY_CARE_PROVIDER_SITE_OTHER): Payer: Self-pay | Admitting: *Deleted

## 2023-12-27 ENCOUNTER — Ambulatory Visit: Admitting: Family Medicine

## 2023-12-27 ENCOUNTER — Encounter: Payer: Self-pay | Admitting: Family Medicine

## 2023-12-27 VITALS — BP 108/76 | HR 79 | Ht 66.0 in | Wt 193.0 lb

## 2023-12-27 DIAGNOSIS — M9901 Segmental and somatic dysfunction of cervical region: Secondary | ICD-10-CM

## 2023-12-27 DIAGNOSIS — M9903 Segmental and somatic dysfunction of lumbar region: Secondary | ICD-10-CM

## 2023-12-27 DIAGNOSIS — M5416 Radiculopathy, lumbar region: Secondary | ICD-10-CM

## 2023-12-27 DIAGNOSIS — M9902 Segmental and somatic dysfunction of thoracic region: Secondary | ICD-10-CM

## 2023-12-27 DIAGNOSIS — M9904 Segmental and somatic dysfunction of sacral region: Secondary | ICD-10-CM

## 2023-12-27 DIAGNOSIS — M9908 Segmental and somatic dysfunction of rib cage: Secondary | ICD-10-CM

## 2023-12-27 NOTE — Patient Instructions (Signed)
 Good to see you! Think Dr. Eldonna can get you straight See you again in 6-8 weeks

## 2023-12-27 NOTE — Assessment & Plan Note (Signed)
 Continues to have some radicular symptoms.  Looking forward to the possibility of an epidural.  Patient is seeing another provider in the relatively near future.  We discussed with patient that I do think that this will be diagnostic as well as potentially therapeutic.  Increase activity slowly.  Discussed icing regimen.  Follow-up again in 6 to 12 weeks

## 2024-01-10 ENCOUNTER — Encounter: Attending: Physical Medicine & Rehabilitation | Admitting: Physical Medicine & Rehabilitation

## 2024-01-10 ENCOUNTER — Encounter: Payer: Self-pay | Admitting: Physical Medicine & Rehabilitation

## 2024-01-10 VITALS — BP 113/77 | HR 86 | Ht 66.0 in | Wt 194.6 lb

## 2024-01-10 DIAGNOSIS — M5416 Radiculopathy, lumbar region: Secondary | ICD-10-CM | POA: Insufficient documentation

## 2024-01-10 DIAGNOSIS — G5702 Lesion of sciatic nerve, left lower limb: Secondary | ICD-10-CM | POA: Insufficient documentation

## 2024-01-10 NOTE — Progress Notes (Signed)
 "  Subjective:    Patient ID: Sherri Cobb, female    DOB: [birth year], 43 y.o.   MRN: [id number]  HPI  Discussed the use of AI scribe software for clinical note transcription with the patient, who gave verbal consent to proceed.  History of Present Illness Sherri Cobb is a 43 year old female with chronic low back pain and left-sided lumbar radiculopathy who presents for evaluation of worsening sciatica.  She has experienced chronic low back pain for many years, with daily symptoms that significantly impair her ability to perform activities and maintain an active lifestyle. Pain is exacerbated by prolonged sitting, particularly after work, and she reports substantial discomfort while seated. She is aware of the importance of posture and core engagement but finds it difficult to consistently implement these strategies during her predominantly home-based work.  Over the past three to four years, she has developed progressive left-sided piriformis pain that has evolved into severe sciatica. Pain radiates from the left buttock down the posterior aspect of the left leg, occasionally reaching the back of the knee. She experiences paroxysms of shocking pain when transitioning from lying to standing. Numbness in the left foot occurs during slow ambulation on hard surfaces, particularly with frequent stopping and standing, such as in grocery stores. She denies right-sided symptoms.  She has participated in physical therapy, primarily for her knee, and regularly engages in gym activities and yoga focused on stretching and strengthening. These interventions provide partial relief, estimated at approximately 15%. She receives regular deep tissue massages for pain management and frequently discontinues workouts due to discomfort. She has not received an epidural for her back pain, only during childbirth over 14 years ago.  Lumbar spine MRI demonstrated a mild disc bulge and left-sided foraminal narrowing  at L4-5.    MR LUMBAR SPINE WITHOUT IV CONTRAST   COMPARISON: CT abdomen and pelvis 07/12/2020   CLINICAL HISTORY: Low back pain.   TECHNIQUE: SAG T2, SAG T1, SAG STIR, AX T2, AX T1 without IV contrast.   FINDINGS: There is normal alignment of the lumbar spine. Benign hemangiomas are seen in the L2 and L3 vertebral bodies. Minimal disc desiccation is present. Mild facet arthrosis is identified. There is an indeterminate focal area of increased signal right pedicle on the the T2-weighted sequences which is low signal on the T1-weighted sequences. This could be an atypical hemangioma with not well seen by prior CT. If patient is having symptoms of pain in the right L3 region bone scan could be performed to evaluate for uptake. Alternatively, MRI with contrast could be performed. There is no vertebral body height loss or subluxation. The sacrum and SI joints are unremarkable so far as visualized. Conus and cauda equina are unremarkable.   T12-L1: There is no focal disc protrusion, foraminal or spinal stenosis.   L1-2: There is no focal disc protrusion, foraminal or spinal stenosis.   L2-3: Mild disc desiccation and mild facet arthrosis. No significant foraminal or spinal stenosis. Again identified is a small right pedicle lesion of uncertain etiology. Benign hemangioma seen in the L3 vertebral body.   L3-4: Minimal broad-based bulge without significant foraminal or spinal stenosis.   L4-5: Mild broad-based bulge with a small central component slightly effacing the ventral thecal sac and crowding the descending nerve roots in the left lateral recess. No definitive impingement. This is best seen on sagittal image 10 and axial image 35.   L5-S1: Mild disc desiccation and mild facet arthrosis. No significant foraminal or  spinal stenosis.   Partial imaging of a probable cyst in the periphery of the right kidney. This is felt to be present on prior CT abdomen and pelvis.    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 There is a focal area of abnormal signal in the right pedicle which is indeterminate. This could reflect a small atypical hemangioma. If patient has significant mid low back pain further evaluation with bone scan could be performed. Repeat CT could also be performed to determine if there is any change in the appearance of the pedicle.   Otherwise, degenerative disc desiccation at L3-4 and L4-5 with slight crowding of the descending nerve roots in the left lateral recess at L4-5 as above.   No acute abnormality.   Electronically signed by: Norleen Satchel MD 06/10/2023 05:15 PM EDT RP Workstation: [id number]   Viewed images independently   Would add that there is facet arthrosis contributing to foraminal stenosis L>R at L4-5   Reviewed Pelvic films no evidence of hip OA or SI jt arthopathy  Pain Inventory Average Pain 6 Pain Right Now 5 My pain is sharp and aching  In the last 24 hours, has pain interfered with the following? General activity 7 Relation with others 4 Enjoyment of life 5 What TIME of day is your pain at its worst? evening Sleep (in general) Fair  Pain is worse with: sitting, inactivity, standing, and some activites Pain improves with: heat/ice, therapy/exercise, medication, and TENS Relief from Meds: 2  walk without assistance ability to climb steps?  yes do you drive?  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  no    Family History  Problem Relation Age of Onset   Hypertension Mother    Hyperlipidemia Mother    Anxiety disorder Mother    Sleep apnea Mother    Thyroid  disease Father    Depression Father    Gout Father    Kidney Stones Father    Miscarriages / Stillbirths Sister    Anxiety disorder Daughter    Depression Daughter    ADD / ADHD Daughter    Hypertension Maternal Grandmother     Depression Maternal Grandmother    Skin cancer Maternal Grandmother    Diverticulitis Maternal Grandmother    BRCA 1/2 Neg Hx    Breast cancer Neg Hx    Social History   Socioeconomic History   Marital status: Media Planner    Spouse name: Belvie   Number of children: 2   Years of education: Not on file   Highest education level: Associate degree: occupational, scientist, product/process development, or vocational program  Occupational History   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  Other Topics Concern   Not on file  Social History Narrative   Marital status/children/pets: Single/Div., 2 daughters   Education/employment: associates degree, administrative work   Field Seismologist:      -smoke alarm in the home:Yes     - wears seatbelt:  Yes     - Feels safe in their relationships: Yes      Social Drivers of Health   Tobacco Use: Low Risk (01/10/2024)   Patient History    Smoking Tobacco Use: Never    Smokeless Tobacco Use: Never    Passive Exposure: Past  Financial Resource Strain: Medium Risk (05/10/2023)   Overall Financial Resource Strain (CARDIA)    Difficulty of Paying Living Expenses: Somewhat hard  Food Insecurity: No Food Insecurity (05/10/2023)   Hunger Vital Sign    Worried About Running Out of Food in the Last Year: Never true    Ran Out of Food in the Last Year: Never true  Transportation Needs: No Transportation Needs (05/10/2023)   PRAPARE - Administrator, Civil Service (Medical): No    Lack of Transportation (Non-Medical): No  Physical Activity: Sufficiently Active (05/10/2023)   Exercise Vital Sign    Days of  Exercise per Week: 5 days    Minutes of Exercise per Session: 30 min  Stress: No Stress Concern Present (05/10/2023)   [employer] of Occupational Health - Occupational Stress Questionnaire    Feeling of Stress : Only a little  Social Connections: Moderately Isolated (05/10/2023)   Social Connection and Isolation Panel    Frequency of Communication with Friends and Family: More than three times a week    Frequency of Social Gatherings with Friends and Family: Once a week    Attends Religious Services: Never    Database Administrator or Organizations: Yes    Attends Engineer, Structural: More than 4 times per year    Marital Status: Divorced  Depression (PHQ2-9): Medium Risk (10/18/2023)   Depression (PHQ2-9)    PHQ-2 Score: 6  Alcohol Screen: Low Risk (05/10/2023)   Alcohol Screen    Last Alcohol Screening Score (AUDIT): 1  Housing: Unknown (05/10/2023)   Housing Stability Vital Sign    Unable to Pay for Housing in the Last Year: No    Number of Times Moved in the Last Year: Not on file    Homeless in the Last Year: No  Utilities: Not on file  Health Literacy: Not on file   Past Surgical History:  Procedure Laterality Date   CHOLECYSTECTOMY N/A 07/12/2020   Procedure: LAPAROSCOPIC CHOLECYSTECTOMY;  Surgeon: Teresa Lonni HERO, MD;  Location: [facility];  Service: General;  Laterality: N/A;   KNEE ARTHROSCOPY WITH LATERAL RELEASE Right 12/27/2019   Procedure: KNEE ARTHROSCOPY WITH LATERAL RELEASE; REMOVAL LOOSE FOREIGN BODY; DEBRIDEMENT/SHAVING CHONDROPLASTY;  Surgeon: Cristy Bonner DASEN, MD;  Location: [facility];  Service: Orthopedics;  Laterality: Right;   LASIK Bilateral 2016   TIBIAL TUBERCLERPLASTY Right 12/27/2019   Procedure: LIGAMENT RECONSTRUCTION KNEE EXTRA-ARTICULAR, ANTERIOR TIBIAL TUBERCLEPLASTY, DECOMPRESSION FASCIOTOMY, LEG ANTERIOR;  Surgeon: Cristy Bonner DASEN, MD;  Location: [facility];  Service: Orthopedics;  Laterality: Right;    WISDOM TOOTH EXTRACTION     Past Medical History:  Diagnosis Date   Allergies    Allergy    Back pain    Chicken pox    Depression 02/04/2022   Fatty liver    Gallbladder problem    Hx of vaginitis 12/10/2017   Recurrent yeast with Mirena IUD Reported history of uterine infection, but unable to find that in her Care Everywhere chart.   Insulin  resistance 12/09/2021   Lactose intolerance    Menometrorrhagia    Before BCPs   Migraines    Psoriasis    BP  113/77   Pulse 86   Ht 5' 6 (1.676 m)   Wt 194 lb 9.6 oz (88.3 kg)   SpO2 96%   BMI 31.41 kg/m   Opioid Risk Score:   Fall Risk Score:  `1  Depression screen [facility] 2/9     01/10/2024   12:46 PM 10/18/2023    1:18 PM 05/10/2023    1:12 PM 12/19/2020   10:29 AM 11/13/2020    8:29 AM 09/15/2017    9:55 AM  Depression screen PHQ 2/9  Decreased Interest 0 0 0 0 2 0  Down, Depressed, Hopeless 1 0 0 0 1 0  PHQ - 2 Score 1 0 0 0 3 0  Altered sleeping 1 1 0  2 0  Tired, decreased energy 3 3 3  3 3   Change in appetite 0 0 3  1 0  Feeling bad or failure about yourself  0 2 0  2 0  Trouble concentrating 1 0 3  1 0  Moving slowly or fidgety/restless 0 0 0  0 0  Suicidal thoughts 0 0 0  0 0  PHQ-9 Score 6 6  9   12  3    Difficult doing work/chores Somewhat difficult Somewhat difficult Somewhat difficult  Somewhat difficult      Data saved with a previous flowsheet row definition     Review of Systems  Musculoskeletal:  Positive for back pain.  All other systems reviewed and are negative.      Objective:    Sacral thrust (prone) : Negative Lateral compression: Negative FABER's: Negative Distraction (supine): Negative Thigh thrust test: Negative Slump test negative Negative straight leg raising test Sensation normal bilateral L2-L3-L4 L5 and S1 dermatome distribution to light touch and pinprick Ambulates without assistive device no evidence of toe drag and instability Motor strength is 5/5 bilateral hip flexor knee  extensor ankle dorsiflexor Mood and affect are appropriate        Assessment & Plan:  Assessment and Plan Assessment & Plan Left lumbar radiculopathy Chronic left-sided lumbar radiculopathy with pain radiating from buttock to posterior left leg, correlating with mild disc bulge and foraminal narrowing at L4/5. Differential includes piriformis syndrome versus true lumbar radiculopathy. - Discussed lumbar epidural steroid injection for diagnostic and therapeutic purposes; reviewed moderate procedural risks, including transient leg numbness and anticoagulation precautions. - Advised need for a driver post-procedure due to possible transient leg numbness. Will schedule epidural for diagnostic/therapeutic purposes - Recommended reducing workout intensity and considering 6-week activity modification - Encouraged continued core strengthening and gentle exercise as tolerated. - Provided guidance on non-curative nature of epidural injection and its role in pain management.  Lumbar spondylosis Chronic premature lumbar spondylosis contributing to foraminal narrowing and nerve root crowding at L4/5, likely exacerbating radicular symptoms. - Discussed long-term benefit of weight loss for improving spine mechanics and reducing spondylosis progression. - Recommended anti-inflammatory diet and provided dietary printout; advised safer than NSAIDs. - Encouraged ongoing efforts to improve posture and core strength. Patient is concerned that insurance is changing and that she may have a higher deductible for next year  "

## 2024-01-10 NOTE — Patient Instructions (Signed)
" °  VISIT SUMMARY: You visited us  today to discuss your worsening sciatica and chronic low back pain. We reviewed your symptoms, MRI results, and discussed potential treatment options, including an epidural steroid injection.  YOUR PLAN: LEFT LUMBAR RADICULOPATHY: You have chronic left-sided lumbar radiculopathy with pain radiating from your buttock to the back of your left leg. This is related to a mild disc bulge and narrowing at L4/5. -We discussed the option of a lumbar epidural steroid injection to help diagnose and treat your pain. This procedure has moderate risks, including temporary leg numbness, and you will need a driver to take you home afterward. Consider reducing your workout intensity and modifying your activities for 6 weeks. -Continue with core strengthening exercises and gentle activities as tolerated. -Remember that the epidural injection is not a cure but can help manage your pain.  LUMBAR SPONDYLOSIS: You have chronic lumbar spondylosis, which is contributing to the narrowing and crowding of the nerve roots at L4/5, worsening your radicular symptoms. -We discussed the long-term benefits of weight loss to improve your spine mechanics and slow the progression of spondylosis. -Follow an anti-inflammatory diet as it is safer than taking NSAIDs. We provided you with a dietary printout. -Continue working on improving your posture and core strength.  Scheduled for left L4-L5 lumbar transforaminal epidural steroid injection under fluoroscopic guidance.                    Contains text generated by Abridge.                                 Contains text generated by Abridge.   "

## 2024-01-17 ENCOUNTER — Ambulatory Visit (INDEPENDENT_AMBULATORY_CARE_PROVIDER_SITE_OTHER): Admitting: Physician Assistant

## 2024-01-18 ENCOUNTER — Encounter (INDEPENDENT_AMBULATORY_CARE_PROVIDER_SITE_OTHER): Payer: Self-pay

## 2024-01-18 ENCOUNTER — Ambulatory Visit (INDEPENDENT_AMBULATORY_CARE_PROVIDER_SITE_OTHER): Admitting: Adult Health

## 2024-02-09 ENCOUNTER — Ambulatory Visit: Admitting: Family Medicine

## 2024-02-17 ENCOUNTER — Encounter: Admitting: Physical Medicine & Rehabilitation

## 2024-02-22 ENCOUNTER — Ambulatory Visit (INDEPENDENT_AMBULATORY_CARE_PROVIDER_SITE_OTHER): Admitting: Physician Assistant

## 2024-02-28 ENCOUNTER — Ambulatory Visit

## 2024-03-15 ENCOUNTER — Encounter: Admitting: Physical Medicine & Rehabilitation

## 2024-03-22 ENCOUNTER — Ambulatory Visit: Admitting: Family Medicine

## 2024-06-19 ENCOUNTER — Ambulatory Visit: Admitting: Physician Assistant

## 2024-10-22 ENCOUNTER — Encounter: Admitting: Family Medicine
